# Patient Record
Sex: Female | Born: 1935 | Race: White | Hispanic: No | Smoking: Former smoker
Health system: Southern US, Community
[De-identification: ages and names within clinical notes are randomized; demographics above are authoritative.]

## PROBLEM LIST (undated history)

## (undated) DIAGNOSIS — I639 Cerebral infarction, unspecified: Secondary | ICD-10-CM

## (undated) DIAGNOSIS — C801 Malignant (primary) neoplasm, unspecified: Secondary | ICD-10-CM

## (undated) DIAGNOSIS — I499 Cardiac arrhythmia, unspecified: Secondary | ICD-10-CM

## (undated) DIAGNOSIS — M1990 Unspecified osteoarthritis, unspecified site: Secondary | ICD-10-CM

## (undated) DIAGNOSIS — Z8489 Family history of other specified conditions: Secondary | ICD-10-CM

## (undated) DIAGNOSIS — T8859XA Other complications of anesthesia, initial encounter: Secondary | ICD-10-CM

## (undated) DIAGNOSIS — R350 Frequency of micturition: Secondary | ICD-10-CM

## (undated) DIAGNOSIS — I10 Essential (primary) hypertension: Secondary | ICD-10-CM

## (undated) DIAGNOSIS — J069 Acute upper respiratory infection, unspecified: Secondary | ICD-10-CM

## (undated) DIAGNOSIS — I341 Nonrheumatic mitral (valve) prolapse: Secondary | ICD-10-CM

## (undated) DIAGNOSIS — T4145XA Adverse effect of unspecified anesthetic, initial encounter: Secondary | ICD-10-CM

## (undated) DIAGNOSIS — R17 Unspecified jaundice: Secondary | ICD-10-CM

## (undated) DIAGNOSIS — J189 Pneumonia, unspecified organism: Secondary | ICD-10-CM

## (undated) HISTORY — PX: EYE SURGERY: SHX253

## (undated) HISTORY — PX: BREAST SURGERY: SHX581

## (undated) HISTORY — PX: FRACTURE SURGERY: SHX138

## (undated) HISTORY — PX: OTHER SURGICAL HISTORY: SHX169

## (undated) HISTORY — PX: APPENDECTOMY: SHX54

## (undated) HISTORY — PX: ELBOW FRACTURE SURGERY: SHX616

## (undated) HISTORY — PX: CARDIAC CATHETERIZATION: SHX172

## (undated) HISTORY — PX: HARDWARE REMOVAL: SHX979

---
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
## 1971-05-20 HISTORY — PX: ABDOMINAL HYSTERECTOMY: SHX81

## 2003-08-12 ENCOUNTER — Ambulatory Visit (HOSPITAL_COMMUNITY): Admission: RE | Admit: 2003-08-12 | Discharge: 2003-08-12 | Payer: Self-pay | Admitting: Internal Medicine

## 2007-05-20 DIAGNOSIS — J189 Pneumonia, unspecified organism: Secondary | ICD-10-CM

## 2007-05-20 HISTORY — DX: Pneumonia, unspecified organism: J18.9

## 2009-05-19 ENCOUNTER — Ambulatory Visit: Payer: Self-pay | Admitting: Internal Medicine

## 2009-06-12 ENCOUNTER — Ambulatory Visit: Payer: Self-pay | Admitting: Internal Medicine

## 2009-06-19 ENCOUNTER — Ambulatory Visit: Payer: Self-pay | Admitting: Internal Medicine

## 2010-05-19 HISTORY — PX: BACK SURGERY: SHX140

## 2011-03-27 ENCOUNTER — Other Ambulatory Visit: Payer: Self-pay | Admitting: Neurosurgery

## 2011-04-04 ENCOUNTER — Encounter (HOSPITAL_COMMUNITY): Payer: Self-pay

## 2011-04-07 ENCOUNTER — Encounter (HOSPITAL_COMMUNITY): Payer: Self-pay | Admitting: Pharmacy Technician

## 2011-04-08 MED ORDER — CEFAZOLIN SODIUM 1-5 GM-% IV SOLN
1.0000 g | INTRAVENOUS | Status: AC
  Administered 2011-04-09: 1 g via INTRAVENOUS
  Filled 2011-04-08: qty 50

## 2011-04-09 ENCOUNTER — Encounter (HOSPITAL_COMMUNITY): Payer: Self-pay | Admitting: Certified Registered"

## 2011-04-09 ENCOUNTER — Ambulatory Visit (HOSPITAL_COMMUNITY): Payer: Medicare Other

## 2011-04-09 ENCOUNTER — Ambulatory Visit (HOSPITAL_COMMUNITY): Payer: Medicare Other | Admitting: Certified Registered"

## 2011-04-09 ENCOUNTER — Ambulatory Visit (HOSPITAL_COMMUNITY)
Admission: RE | Admit: 2011-04-09 | Discharge: 2011-04-10 | Disposition: A | Discharge status: Home / Self Care | Payer: Medicare Other | Source: Ambulatory Visit | Attending: Neurosurgery | Admitting: Neurosurgery

## 2011-04-09 ENCOUNTER — Encounter (HOSPITAL_COMMUNITY): Payer: Self-pay | Admitting: *Deleted

## 2011-04-09 ENCOUNTER — Other Ambulatory Visit: Payer: Self-pay | Admitting: Neurosurgery

## 2011-04-09 ENCOUNTER — Other Ambulatory Visit: Payer: Self-pay

## 2011-04-09 ENCOUNTER — Encounter (HOSPITAL_COMMUNITY)
Admission: RE | Disposition: A | Discharge status: Home / Self Care | Payer: Self-pay | Source: Ambulatory Visit | Attending: Neurosurgery

## 2011-04-09 DIAGNOSIS — S32009A Unspecified fracture of unspecified lumbar vertebra, initial encounter for closed fracture: Secondary | ICD-10-CM | POA: Insufficient documentation

## 2011-04-09 DIAGNOSIS — I059 Rheumatic mitral valve disease, unspecified: Secondary | ICD-10-CM | POA: Insufficient documentation

## 2011-04-09 DIAGNOSIS — S32000A Wedge compression fracture of unspecified lumbar vertebra, initial encounter for closed fracture: Secondary | ICD-10-CM

## 2011-04-09 DIAGNOSIS — Y929 Unspecified place or not applicable: Secondary | ICD-10-CM | POA: Insufficient documentation

## 2011-04-09 DIAGNOSIS — W19XXXA Unspecified fall, initial encounter: Secondary | ICD-10-CM | POA: Insufficient documentation

## 2011-04-09 DIAGNOSIS — I10 Essential (primary) hypertension: Secondary | ICD-10-CM | POA: Insufficient documentation

## 2011-04-09 HISTORY — DX: Wedge compression fracture of unspecified lumbar vertebra, initial encounter for closed fracture: S32.000A

## 2011-04-09 HISTORY — DX: Unspecified porphyria: E80.20

## 2011-04-09 HISTORY — DX: Acute upper respiratory infection, unspecified: J06.9

## 2011-04-09 HISTORY — DX: Unspecified osteoarthritis, unspecified site: M19.90

## 2011-04-09 HISTORY — DX: Nonrheumatic mitral (valve) prolapse: I34.1

## 2011-04-09 HISTORY — DX: Cardiac arrhythmia, unspecified: I49.9

## 2011-04-09 HISTORY — DX: Essential (primary) hypertension: I10

## 2011-04-09 HISTORY — DX: Pneumonia, unspecified organism: J18.9

## 2011-04-09 HISTORY — DX: Other complications of anesthesia, initial encounter: T88.59XA

## 2011-04-09 HISTORY — DX: Adverse effect of unspecified anesthetic, initial encounter: T41.45XA

## 2011-04-09 LAB — BASIC METABOLIC PANEL
Chloride: 103 mEq/L (ref 96–112)
Creatinine, Ser: 0.74 mg/dL (ref 0.50–1.10)
Potassium: 3.8 mEq/L (ref 3.5–5.1)

## 2011-04-09 LAB — CBC
HCT: 37.2 % (ref 36.0–46.0)
Hemoglobin: 12.7 g/dL (ref 12.0–15.0)
MCH: 30.7 pg (ref 26.0–34.0)
MCHC: 34.1 g/dL (ref 30.0–36.0)

## 2011-04-09 LAB — SURGICAL PCR SCREEN
MRSA, PCR: NEGATIVE
Staphylococcus aureus: NEGATIVE

## 2011-04-09 SURGERY — KYPHOPLASTY
Anesthesia: General | Wound class: Clean

## 2011-04-09 MED ORDER — DOCUSATE SODIUM 100 MG PO CAPS
100.0000 mg | ORAL_CAPSULE | Freq: Two times a day (BID) | ORAL | Status: DC
  Administered 2011-04-09: 100 mg via ORAL
  Filled 2011-04-09: qty 1

## 2011-04-09 MED ORDER — MECLIZINE HCL 25 MG PO TABS
25.0000 mg | ORAL_TABLET | Freq: Three times a day (TID) | ORAL | Status: DC | PRN
  Filled 2011-04-09: qty 1

## 2011-04-09 MED ORDER — LACTATED RINGERS IV SOLN
INTRAVENOUS | Status: DC | PRN
  Administered 2011-04-09: 14:00:00 via INTRAVENOUS

## 2011-04-09 MED ORDER — PHENOL 1.4 % MT LIQD: 1.0000 | OROMUCOSAL | Status: DC | PRN

## 2011-04-09 MED ORDER — MUPIROCIN 2 % EX OINT
TOPICAL_OINTMENT | Freq: Two times a day (BID) | CUTANEOUS | Status: DC
  Administered 2011-04-09: 10:00:00 via NASAL
  Filled 2011-04-09 (×2): qty 22

## 2011-04-09 MED ORDER — SODIUM CHLORIDE 0.9 % IV SOLN: 250.0000 mL | INTRAVENOUS | Status: DC

## 2011-04-09 MED ORDER — FENTANYL CITRATE 0.05 MG/ML IJ SOLN
INTRAMUSCULAR | Status: DC | PRN
  Administered 2011-04-09 (×2): 50 ug via INTRAVENOUS

## 2011-04-09 MED ORDER — ONDANSETRON HCL 4 MG/2ML IJ SOLN
4.0000 mg | INTRAMUSCULAR | Status: DC | PRN
  Administered 2011-04-09: 4 mg via INTRAVENOUS
  Filled 2011-04-09: qty 2

## 2011-04-09 MED ORDER — MENTHOL 3 MG MT LOZG
1.0000 | LOZENGE | OROMUCOSAL | Status: DC | PRN
  Filled 2011-04-09: qty 9

## 2011-04-09 MED ORDER — LACTATED RINGERS IV SOLN
INTRAVENOUS | Status: DC
  Administered 2011-04-09: 21:00:00 via INTRAVENOUS

## 2011-04-09 MED ORDER — BACITRACIN ZINC 500 UNIT/GM EX OINT
TOPICAL_OINTMENT | CUTANEOUS | Status: DC | PRN
  Administered 2011-04-09: 1 via TOPICAL

## 2011-04-09 MED ORDER — MORPHINE SULFATE 4 MG/ML IJ SOLN: 1.0000 mg | INTRAMUSCULAR | Status: DC | PRN

## 2011-04-09 MED ORDER — IBUPROFEN 800 MG PO TABS
800.0000 mg | ORAL_TABLET | Freq: Two times a day (BID) | ORAL | Status: DC | PRN
  Filled 2011-04-09: qty 1

## 2011-04-09 MED ORDER — SODIUM CHLORIDE 0.9 % IJ SOLN: 3.0000 mL | INTRAMUSCULAR | Status: DC | PRN

## 2011-04-09 MED ORDER — ACETAMINOPHEN 325 MG PO TABS: 650.0000 mg | ORAL_TABLET | ORAL | Status: DC | PRN

## 2011-04-09 MED ORDER — SODIUM CHLORIDE 0.9 % IJ SOLN
3.0000 mL | Freq: Two times a day (BID) | INTRAMUSCULAR | Status: DC
  Administered 2011-04-09: 3 mL via INTRAVENOUS

## 2011-04-09 MED ORDER — SIMVASTATIN 5 MG PO TABS
5.0000 mg | ORAL_TABLET | Freq: Every day | ORAL | Status: DC
  Administered 2011-04-09: 5 mg via ORAL
  Filled 2011-04-09 (×2): qty 1

## 2011-04-09 MED ORDER — ROCURONIUM BROMIDE 100 MG/10ML IV SOLN
INTRAVENOUS | Status: DC | PRN
  Administered 2011-04-09: 40 mg via INTRAVENOUS

## 2011-04-09 MED ORDER — BUPIVACAINE-EPINEPHRINE 0.5% -1:200000 IJ SOLN
INTRAMUSCULAR | Status: DC | PRN
  Administered 2011-04-09: 16 mL

## 2011-04-09 MED ORDER — ONDANSETRON HCL 4 MG/2ML IJ SOLN
INTRAMUSCULAR | Status: DC | PRN
  Administered 2011-04-09: 4 mg via INTRAVENOUS

## 2011-04-09 MED ORDER — FENTANYL CITRATE 0.05 MG/ML IJ SOLN
25.0000 ug | INTRAMUSCULAR | Status: DC | PRN
  Administered 2011-04-09 (×3): 50 ug via INTRAVENOUS

## 2011-04-09 MED ORDER — ACETAMINOPHEN 650 MG RE SUPP: 650.0000 mg | RECTAL | Status: DC | PRN

## 2011-04-09 MED ORDER — POLYETHYL GLYCOL-PROPYL GLYCOL 0.4-0.3 % OP SOLN: 2.0000 [drp] | Freq: Three times a day (TID) | OPHTHALMIC | Status: DC | PRN

## 2011-04-09 MED ORDER — IOHEXOL 300 MG/ML  SOLN
INTRAMUSCULAR | Status: DC | PRN
  Administered 2011-04-09: 50 mL via INTRAVENOUS

## 2011-04-09 MED ORDER — LOSARTAN POTASSIUM 25 MG PO TABS
25.0000 mg | ORAL_TABLET | Freq: Two times a day (BID) | ORAL | Status: DC
  Administered 2011-04-09: 25 mg via ORAL
  Filled 2011-04-09 (×3): qty 1

## 2011-04-09 MED ORDER — HYDROCODONE-ACETAMINOPHEN 5-325 MG PO TABS
1.0000 | ORAL_TABLET | ORAL | Status: DC | PRN
  Administered 2011-04-09 – 2011-04-10 (×2): 2 via ORAL
  Filled 2011-04-09 (×2): qty 2

## 2011-04-09 MED ORDER — CEFAZOLIN SODIUM 1-5 GM-% IV SOLN
1.0000 g | Freq: Three times a day (TID) | INTRAVENOUS | Status: AC
  Administered 2011-04-09 – 2011-04-10 (×2): 1 g via INTRAVENOUS
  Filled 2011-04-09 (×2): qty 50

## 2011-04-09 MED ORDER — NADOLOL 20 MG PO TABS: 5.0000 mg | ORAL_TABLET | Freq: Two times a day (BID) | ORAL | Status: DC

## 2011-04-09 MED ORDER — POLYVINYL ALCOHOL 1.4 % OP SOLN
1.0000 [drp] | Freq: Three times a day (TID) | OPHTHALMIC | Status: DC | PRN
  Administered 2011-04-09: 1 [drp] via OPHTHALMIC
  Filled 2011-04-09: qty 15

## 2011-04-09 MED ORDER — OXYCODONE-ACETAMINOPHEN 5-325 MG PO TABS
1.0000 | ORAL_TABLET | ORAL | Status: DC | PRN
Start: 2011-04-09 — End: 2011-04-10

## 2011-04-09 MED ORDER — DIAZEPAM 5 MG PO TABS: 5.0000 mg | ORAL_TABLET | Freq: Four times a day (QID) | ORAL | Status: DC | PRN

## 2011-04-09 MED ORDER — NEOSTIGMINE METHYLSULFATE 1 MG/ML IJ SOLN
INTRAMUSCULAR | Status: DC | PRN
  Administered 2011-04-09: 4 mg via INTRAVENOUS

## 2011-04-09 MED ORDER — PROPOFOL 10 MG/ML IV EMUL
INTRAVENOUS | Status: DC | PRN
  Administered 2011-04-09: 100 mg via INTRAVENOUS

## 2011-04-09 MED ORDER — PROMETHAZINE HCL 25 MG/ML IJ SOLN: 6.2500 mg | INTRAMUSCULAR | Status: DC | PRN

## 2011-04-09 MED ORDER — GLYCOPYRROLATE 0.2 MG/ML IJ SOLN
INTRAMUSCULAR | Status: DC | PRN
  Administered 2011-04-09: .6 mg via INTRAVENOUS

## 2011-04-09 MED ORDER — ZOLPIDEM TARTRATE 5 MG PO TABS: 5.0000 mg | ORAL_TABLET | Freq: Every evening | ORAL | Status: DC | PRN

## 2011-04-09 MED ORDER — THERA M PLUS PO TABS
1.0000 | ORAL_TABLET | Freq: Every day | ORAL | Status: DC
  Filled 2011-04-09 (×2): qty 1

## 2011-04-09 SURGICAL SUPPLY — 55 items
BENZOIN TINCTURE PRP APPL 2/3 (GAUZE/BANDAGES/DRESSINGS) ×2 IMPLANT
BLADE SURG 15 STRL LF DISP TIS (BLADE) ×1 IMPLANT
BLADE SURG 15 STRL SS (BLADE) ×1
BLADE SURG ROTATE 9660 (MISCELLANEOUS) IMPLANT
CEMENT BONE KYPHX HV R (Orthopedic Implant) ×2 IMPLANT
CEMENT KYPHON C01A KIT/MIXER (Cement) ×2 IMPLANT
CLOTH BEACON ORANGE TIMEOUT ST (SAFETY) ×2 IMPLANT
CONT SPEC 4OZ CLIKSEAL STRL BL (MISCELLANEOUS) ×4 IMPLANT
DERMABOND ADVANCED (GAUZE/BANDAGES/DRESSINGS) ×1
DERMABOND ADVANCED .7 DNX12 (GAUZE/BANDAGES/DRESSINGS) ×1 IMPLANT
DEVICE BIOPSY BONE KYPHX (INSTRUMENTS) ×2 IMPLANT
DRAPE C-ARM 42X72 X-RAY (DRAPES) ×2 IMPLANT
DRAPE INCISE IOBAN 66X45 STRL (DRAPES) ×2 IMPLANT
DRAPE LAPAROTOMY 100X72X124 (DRAPES) ×2 IMPLANT
DRAPE PROXIMA HALF (DRAPES) ×6 IMPLANT
DRAPE SURG 17X23 STRL (DRAPES) ×8 IMPLANT
GAUZE SPONGE 4X4 16PLY XRAY LF (GAUZE/BANDAGES/DRESSINGS) ×2 IMPLANT
GLOVE BIO SURGEON STRL SZ8.5 (GLOVE) IMPLANT
GLOVE BIOGEL PI IND STRL 6.5 (GLOVE) ×1 IMPLANT
GLOVE BIOGEL PI IND STRL 7.0 (GLOVE) ×1 IMPLANT
GLOVE BIOGEL PI IND STRL 8 (GLOVE) ×1 IMPLANT
GLOVE BIOGEL PI IND STRL 8.5 (GLOVE) ×1 IMPLANT
GLOVE BIOGEL PI INDICATOR 6.5 (GLOVE) ×1
GLOVE BIOGEL PI INDICATOR 7.0 (GLOVE) ×1
GLOVE BIOGEL PI INDICATOR 8 (GLOVE) ×1
GLOVE BIOGEL PI INDICATOR 8.5 (GLOVE) ×1
GLOVE EXAM NITRILE LRG STRL (GLOVE) IMPLANT
GLOVE EXAM NITRILE MD LF STRL (GLOVE) ×2 IMPLANT
GLOVE EXAM NITRILE XL STR (GLOVE) IMPLANT
GLOVE EXAM NITRILE XS STR PU (GLOVE) IMPLANT
GLOVE INDICATOR 7.0 STRL GRN (GLOVE) ×2 IMPLANT
GLOVE OPTIFIT SS 6.5 STRL BRWN (GLOVE) ×2 IMPLANT
GLOVE SS BIOGEL STRL SZ 8 (GLOVE) IMPLANT
GLOVE SUPERSENSE BIOGEL SZ 8 (GLOVE)
GLOVE SURG SS PI 6.5 STRL IVOR (GLOVE) ×2 IMPLANT
GLOVE SURG SS PI 7.5 STRL IVOR (GLOVE) ×2 IMPLANT
GLOVE SURG SS PI 8.0 STRL IVOR (GLOVE) ×2 IMPLANT
GLOVE SURG SS PI 8.5 STRL IVOR (GLOVE) ×1
GLOVE SURG SS PI 8.5 STRL STRW (GLOVE) ×1 IMPLANT
GOWN BRE IMP SLV AUR LG STRL (GOWN DISPOSABLE) ×2 IMPLANT
GOWN BRE IMP SLV AUR XL STRL (GOWN DISPOSABLE) ×4 IMPLANT
KIT BASIN OR (CUSTOM PROCEDURE TRAY) ×2 IMPLANT
KIT ROOM TURNOVER OR (KITS) ×2 IMPLANT
NEEDLE HYPO 25X1 1.5 SAFETY (NEEDLE) ×2 IMPLANT
NS IRRIG 1000ML POUR BTL (IV SOLUTION) ×2 IMPLANT
PACK EENT II TURBAN DRAPE (CUSTOM PROCEDURE TRAY) ×2 IMPLANT
PAD ARMBOARD 7.5X6 YLW CONV (MISCELLANEOUS) ×6 IMPLANT
SPECIMEN JAR SMALL (MISCELLANEOUS) IMPLANT
SPONGE GAUZE 4X4 12PLY (GAUZE/BANDAGES/DRESSINGS) ×4 IMPLANT
STRIP CLOSURE SKIN 1/2X4 (GAUZE/BANDAGES/DRESSINGS) ×2 IMPLANT
SUT VIC AB 3-0 SH 8-18 (SUTURE) ×2 IMPLANT
SYR CONTROL 10ML LL (SYRINGE) ×2 IMPLANT
TOWEL OR 17X24 6PK STRL BLUE (TOWEL DISPOSABLE) ×2 IMPLANT
TOWEL OR 17X26 10 PK STRL BLUE (TOWEL DISPOSABLE) ×2 IMPLANT
TRAY KYPHOPAK 20/3 ONESTEP 1ST (MISCELLANEOUS) ×2 IMPLANT

## 2011-04-09 NOTE — Anesthesia Preprocedure Evaluation (Addendum)
Anesthesia Evaluation  Patient identified by MRN, date of birth, ID band Patient awake    Reviewed: Allergy & Precautions, H&P , NPO status , Patient's Chart, lab work & pertinent test results, reviewed documented beta blocker date and time   History of Anesthesia Complications History of anesthetic complications: prolonged wake up   Airway Mallampati: II TM Distance: >3 FB Neck ROM: Full    Dental No notable dental hx. (+) Teeth Intact   Pulmonary neg pulmonary ROS,  clear to auscultation  Pulmonary exam normal       Cardiovascular hypertension (treated with cozaar), Pt. on medications Valvular problems/murmurs: history of MVP- never symptomatic, ?ECHO in past? 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 or been symptomatic, never diagnosed   Anesthesia Other Findings   Reproductive/Obstetrics                      Anesthesia Physical Anesthesia Plan  ASA: II  Anesthesia Plan: General   Post-op Pain Management:    Induction: Intravenous  Airway Management Planned: Oral ETT  Additional Equipment:   Intra-op Plan:   Post-operative Plan: Extubation in OR  Informed Consent: I have reviewed the patients History and Physical, chart, labs and discussed the procedure including the risks, benefits and alternatives for the proposed anesthesia with the patient or authorized representative who has indicated his/her understanding and acceptance.   Dental advisory given  Plan Discussed with:   Anesthesia Plan Comments: (Plan routine monitors, GETA with careful prone positioning)        Anesthesia Quick  Evaluation

## 2011-04-09 NOTE — Transfer of Care (Signed)
Immediate Anesthesia Transfer of Care Note  Patient: Chelsea Mcintyre  Procedure(s) Performed:  KYPHOPLASTY - Lumbar one Kyphoplasty      Patient Location: PACU  Anesthesia Type: General  Level of Consciousness: awake  Airway & Oxygen Therapy: Patient Spontanous Breathing and Patient connected to face mask oxygen  Post-op Assessment: Report given to PACU RN, Post -op Vital signs reviewed and stable and Patient moving all extremities X 4  Post vital signs: stable  Complications: No apparent anesthesia complications

## 2011-04-09 NOTE — Op Note (Signed)
Preoperative diagnosis: L1 compression fracture  Postop diagnosis: The same  Procedure: L1 kyphoplasty and vertebral body biopsy.  Surgeon Dr. Delma Officer  Asst.: None  Anesthesia: Gen. endotracheal  Estimated blood loss is minimal  Specimens: L1 vertebral body biopsy  Drains: None  Complications: None  Description of procedure: The patient was brought to the operating room by the anesthesia team. General endotracheal anesthesia was induced. The patient was turned to the prone position on the chest rolls. Patient's thoracolumbar region was then prepared with Betadine scrub and Betadine solution. The area to be incised was injected with Marcaine with epinephrine solution.  I localized the L1 vertebrae using biplanar fluoroscopy. I then used a 15 blade scalpel to make a small incision over the L1 pedicle bilaterally. Then cannulated the pedicles with the trocar under biplanar fluoroscopy. I then removed the cannula obtained the vertebral by biopsy via the L1 pedicle on the left. I then used a drill to drill the pedicle at L1 bilaterally under fluoroscopic guidance. I then inserted and inflated the balloons bilaterally under fluoroscopic guidance. I then deflated the balloon and removed the balloon. I then filled the void created by the balloon with methylmethacrylate under fluoroscopic guidance. After were satisfied with the kyphoplasty I removed the cannulas.  I then reapproximated patient's subcutaneous tissue with interrupted 3-0 Vicryl suture. I then coated the skin with Dermabond. The drapes were removed. The patient was subtotally returned to the supine position where she was extubated by the anesthesia team. Patient was transported to the post anesthesia care unit in stable condition. All sponge instrument and needle counts were correct at the end of this case.

## 2011-04-09 NOTE — Plan of Care (Signed)
Problem: Consults Goal: Diagnosis - Spinal Surgery Outcome: Completed/Met Date Met:  04/09/11 Kyphoplasty

## 2011-04-09 NOTE — Progress Notes (Signed)
Dr. Jean Rosenthal notified of anesthesia history

## 2011-04-09 NOTE — Anesthesia Postprocedure Evaluation (Signed)
  2Anesthesia Post-op Note  Patient: Chelsea Mcintyre  Procedure(s) Performed:  KYPHOPLASTY - Lumbar one Kyphoplasty      Patient Location: PACU  Anesthesia Type: General  Level of Consciousness: awake and alert   Airway and Oxygen Therapy: Patient Spontanous Breathing and Patient connected to nasal cannula oxygen  Post-op Pain: none  Post-op Assessment: Post-op Vital signs reviewed, Patient's Cardiovascular Status Stable, Respiratory Function Stable, Patent Airway, No signs of Nausea or vomiting and Pain level controlled  Post-op Vital Signs: Reviewed and stable  Complications: No apparent anesthesia complications

## 2011-04-09 NOTE — H&P (Signed)
Subjective: Patient is a 77 year old white female who was in her usual state of good health until 03/15/2011. On that day the patient fell and had the immediate onset of back pain. She failed medical management was worked up with a lumbar MRI which demonstrated L1 compression fracture. I discussed the various treatment options with her including surgery. Patient has decided proceed with surgery.   Past Medical History  Diagnosis Date  . Complication of anesthesia     heart stopped with the anesthesia x1,  difficult to wake up  . MVP (mitral valve prolapse)   . Hypertension   . Dysrhythmia     if taking certain medications  . Pneumonia   . Recurrent upper respiratory infection (URI)     Dx 1st of september with sinus infection, placed on antibiotics at that time  . Arthritis     bilateral hips and bilateral shoulders.  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 Tonsillectomy   . Appendectomy   . Fibroid tumor     1970, right breast removed  . Fibroid cyst     ovary right removed 1966  . Abdominal hysterectomy     partial  . Breast surgery     1982  . Cardiac catheterization     1994, detected MVP  . Fracture surgery     1988 shattered elbow left  . Hardware removal     left elbow 1998  . Eye surgery     bilateral cataract surgery 2007 with lens implant    Allergies  Allergen Reactions  . Sulfa Drugs Cross Reactors Shortness Of Breath and Rash  . Latex Hives  . Tape Other (See Comments)    Layer of skin peels off and area becomes raw    History  Substance Use Topics  . Smoking status: Former Smoker -- 0.5 packs/day for 2 years  . Smokeless tobacco: Not on file   Comment: off and on, non smoker presently  . Alcohol Use: No    Family History  Problem Relation Age of Onset  . Anesthesia problems Father    Prior to Admission medications   Medication Sig Start Date End Date Taking?  Authorizing Provider  aspirin EC 81 MG tablet Take 81 mg by mouth every evening.     Yes Historical Provider, MD  calcium carbonate (OS-CAL - DOSED IN MG OF ELEMENTAL CALCIUM) 1250 MG tablet Take 1 tablet by mouth daily.     Yes Historical Provider, MD  cholecalciferol (VITAMIN D) 1000 UNITS tablet Take 2,000 Units by mouth daily.     Yes Historical Provider, MD  HYDROcodone-acetaminophen (NORCO) 5-325 MG per tablet Take 2 tablets by mouth every 6 (six) hours as needed. For pain    Yes Historical Provider, MD  ibuprofen (ADVIL,MOTRIN) 800 MG tablet Take 800 mg by mouth 2 (two) times daily as needed. For pain    Yes Historical Provider, MD  losartan (COZAAR) 50 MG tablet Take 25 mg by mouth 2 (two) times daily.     Yes Historical Provider, MD  meclizine (ANTIVERT) 25 MG tablet Take 25 mg by mouth daily as needed. For dizzines    Yes Historical Provider, MD  Multiple Vitamins-Minerals (MULTIVITAMINS THER. W/MINERALS) TABS Take 1 tablet by mouth daily.     Yes Historical Provider, MD  nadolol (CORGARD) 20 MG tablet Take 5 mg by mouth 2 (two) times  daily.     Yes Historical Provider, MD  Polyethyl Glycol-Propyl Glycol (SYSTANE) 0.4-0.3 % SOLN Apply 2 drops to eye 3 (three) times daily as needed. For dry eyes    Yes Historical Provider, MD  simvastatin (ZOCOR) 10 MG tablet Take 5 mg by mouth at bedtime.     Yes Historical Provider, MD     Review of Systems  Positive ROS: Negative except as above  All other systems have been reviewed and were otherwise negative with the exception of those mentioned in the HPI and as above.  Objective: Vital signs in last 24 hours: Temp:  [98.5 F (36.9 C)] 98.5 F (36.9 C) (11/21 1019) Pulse Rate:  [64] 64  (11/21 1019) Resp:  [18] 18  (11/21 1019) BP: (171)/(78) 171/78 mmHg (11/21 1019) SpO2:  [99 %] 99 % (11/21 1019) Weight:  [79.379 kg (175 lb)-81.647 kg (180 lb)] 175 lb (79.379 kg) (11/21 1003)  General Appearance: Alert, cooperative, no distress,  appears stated age Head: Normocephalic, without obvious abnormality, atraumatic Eyes: PERRL, conjunctiva/corneas clear, EOM's intact, fundi benign, both eyes      Ears: Normal TM's and external ear canals, both ears Throat: Lips, mucosa, and tongue normal; teeth and gums normal Neck: Supple, symmetrical, trachea midline, no adenopathy; thyroid: No enlargement/tenderness/nodules; no carotid bruit or JVD Back: The patient complains of back pain at approximately L1. There is no obvious deformities. Lungs: Clear to auscultation bilaterally, respirations unlabored Heart: Regular rate and rhythm, S1 and S2 normal, no murmur, rub or gallop Abdomen: Soft, non-tender, bowel sounds active all four quadrants, no masses, no organomegaly Extremities: Extremities normal, atraumatic, no cyanosis or edema Pulses: 2+ and symmetric all extremities Skin: Skin color, texture, turgor normal, no rashes or lesions  NEUROLOGIC:   Mental status: alert and oriented, no aphasia, good attention span, Fund of knowledge/ memory ok Motor Exam - grossly normal Sensory Exam - grossly normal Reflexes:  Coordination - grossly normal Gait - grossly normal Balance - grossly normal Cranial Nerves: I: smell Not tested  II: visual acuity  OS: Normal    OD: Normal   II: visual fields Full to confrontation  II: pupils Equal, round, reactive to light  III,VII: ptosis None  III,IV,VI: extraocular muscles  Full ROM  V: mastication Normal  V: facial light touch sensation  Normal  V,VII: corneal reflex  Present  VII: facial muscle function - upper  Normal  VII: facial muscle function - lower Normal  VIII: hearing Not tested  IX: soft palate elevation  Normal  IX,X: gag reflex Present  XI: trapezius strength  5/5  XI: sternocleidomastoid strength 5/5  XI: neck flexion strength  5/5  XII: tongue strength  Normal    Data Review Lab Results  Component Value Date   WBC 5.7 04/09/2011   HGB 12.7 04/09/2011   HCT 37.2  04/09/2011   MCV 89.9 04/09/2011   PLT 196 04/09/2011   Lab Results  Component Value Date   NA 141 04/09/2011   K 3.8 04/09/2011   CL 103 04/09/2011   CO2 29 04/09/2011   BUN 15 04/09/2011   CREATININE 0.74 04/09/2011   GLUCOSE 99 04/09/2011   No results found for this basename: INR, PROTIME   imaging studies: I reviewed the patient's lumbar MRI performed at [facility]. Demonstrates the patient has disc degeneration at L2-3 and L3-4. She has an L1 compression fracture without significant neural compression.  Assessment/Plan: L1 compression fracture, lumbago: I discussed situation with the patient and  her family. We have discussed the various treatment options including surgery. I described the surgical option of an L1 kyphoplasty. We have discussed the risks, benefits, alternatives, and likelihood of achieving our goals. I have answered all the patient's and family's questions. They want to proceed with surgery.   Daionna Crossland D 04/09/2011 1:42 PM

## 2011-04-09 NOTE — Progress Notes (Signed)
Subjective:  The patient is alert and pleasant. She appears comfortable.  Objective: Vital signs in last 24 hours: Temp:  [97.2 F (36.2 C)-98.5 F (36.9 C)] 97.2 F (36.2 C) (11/21 1530) Pulse Rate:  [64] 64  (11/21 1019) Resp:  [18] 18  (11/21 1019) BP: (171)/(78) 171/78 mmHg (11/21 1019) SpO2:  [99 %] 99 % (11/21 1019) Weight:  [79.379 kg (175 lb)-81.647 kg (180 lb)] 175 lb (79.379 kg) (11/21 1003)  Intake/Output from previous day:   Intake/Output this shift: Total I/O In: 1000 [I.V.:1000] Out: 10 [Blood:10]  Physical exam the patient is alert. She is moving all 4 extremities well.  Lab Results:  Basename 04/09/11 1004  WBC 5.7  HGB 12.7  HCT 37.2  PLT 196   BMET  Basename 04/09/11 1004  NA 141  K 3.8  CL 103  CO2 29  GLUCOSE 99  BUN 15  CREATININE 0.74  CALCIUM 9.2    Studies/Results: Dg Chest 2 View  04/09/2011  *RADIOLOGY REPORT*  Clinical Data: History of hypertension and preop evaluation. L1 compression fracture.  CHEST - 2 VIEW  Comparison: None.  Findings: Two views of the chest demonstrate a compression fracture in the upper lumbar spine representing the L1 fracture.  There is also a compression deformity involving the T8 vertebral body. Lungs are clear bilaterally.  Heart and mediastinum are within normal limits and the trachea is midline.  IMPRESSION: No acute chest findings.  Compression fractures involving L1 and T8.  Original Report Authenticated By: Richarda Overlie, M.D.    Assessment/Plan: The patient is doing well.  LOS: 0 days     Kathi Dohn D 04/09/2011, 3:48 PM

## 2011-04-09 NOTE — Preoperative (Signed)
Beta Blockers   Reason not to administer Beta Blockers:Patient took beta blocker 11/21 am

## 2011-04-10 MED ORDER — HYDROCODONE-ACETAMINOPHEN 5-325 MG PO TABS: 1.0000 | ORAL_TABLET | ORAL | Status: AC | PRN

## 2011-04-10 NOTE — Discharge Summary (Signed)
Physician Discharge Summary  Patient ID: Chelsea Mcintyre MRN: [id number] DOB/AGE: 1933/05/12 77 y.o.  Admit date: 04/09/2011 Discharge date: 04/10/2011  Admission Diagnoses: Compression fracture  Discharge Diagnoses: Same Principal Problem:  *Lumbar compression fracture   Discharged Condition: good  Hospital Course: Patient was noted to the hospital underwent kyphoplasty postoperatively did bear well went to the recover and then the floor. She convalesced well by postop day 1 was angling and voiding spontaneously and ready for discharge.  Consults: none  Significant Diagnostic Studies:   Treatments: surgery: Kyphoplasty  Discharge Exam: Blood pressure 126/58, pulse 73, temperature 99 F (37.2 C), temperature source Oral, resp. rate 18, height 5\' 7"  (1.702 m), weight 79.379 kg (175 lb), SpO2 95.00%. Awake alert and oriented neurologically intact.  Disposition: Final discharge disposition not confirmed   Current Discharge Medication List    CONTINUE these medications which have NOT CHANGED   Details  aspirin EC 81 MG tablet Take 81 mg by mouth every evening.      calcium carbonate (OS-CAL - DOSED IN MG OF ELEMENTAL CALCIUM) 1250 MG tablet Take 1 tablet by mouth daily.      cholecalciferol (VITAMIN D) 1000 UNITS tablet Take 2,000 Units by mouth daily.      HYDROcodone-acetaminophen (NORCO) 5-325 MG per tablet Take 2 tablets by mouth every 6 (six) hours as needed. For pain     ibuprofen (ADVIL,MOTRIN) 800 MG tablet Take 800 mg by mouth 2 (two) times daily as needed. For pain     losartan (COZAAR) 50 MG tablet Take 25 mg by mouth 2 (two) times daily.      meclizine (ANTIVERT) 25 MG tablet Take 25 mg by mouth daily as needed. For dizzines     Multiple Vitamins-Minerals (MULTIVITAMINS THER. W/MINERALS) TABS Take 1 tablet by mouth daily.      nadolol (CORGARD) 20 MG tablet Take 5 mg by mouth 2 (two) times daily.     Polyethyl Glycol-Propyl Glycol (SYSTANE) 0.4-0.3 % SOLN  Apply 2 drops to eye 3 (three) times daily as needed. For dry eyes     simvastatin (ZOCOR) 10 MG tablet Take 5 mg by mouth at bedtime.           Signed: Trevor Duty P 04/10/2011, 7:37 AM

## 2011-04-10 NOTE — Progress Notes (Signed)
Subjective: Patient reports Patient for significant room and upper back pain. She's tolerating by mouth and is ready for discharge.  Objective: Vital signs in last 24 hours: Temp:  [97.2 F (36.2 C)-99.4 F (37.4 C)] 99 F (37.2 C) (11/22 0400) Pulse Rate:  [48-81] 73  (11/22 0400) Resp:  [11-33] 18  (11/22 0400) BP: (126-173)/(58-84) 126/58 mmHg (11/22 0400) SpO2:  [94 %-100 %] 95 % (11/22 0400) Weight:  [79.379 kg (175 lb)-81.647 kg (180 lb)] 175 lb (79.379 kg) (11/21 1003)  Intake/Output from previous day: 11/21 0701 - 11/22 0700 In: 1500 [I.V.:1500] Out: 10 [Blood:10] Intake/Output this shift:    neurologically stable with 5 out of 5 strength.  Lab Results:  Basename 04/09/11 1004  WBC 5.7  HGB 12.7  HCT 37.2  PLT 196   BMET  Basename 04/09/11 1004  NA 141  K 3.8  CL 103  CO2 29  GLUCOSE 99  BUN 15  CREATININE 0.74  CALCIUM 9.2    Studies/Results: Dg Chest 2 View  04/09/2011  *RADIOLOGY REPORT*  Clinical Data: History of hypertension and preop evaluation. L1 compression fracture.  CHEST - 2 VIEW  Comparison: None.  Findings: Two views of the chest demonstrate a compression fracture in the upper lumbar spine representing the L1 fracture.  There is also a compression deformity involving the T8 vertebral body. Lungs are clear bilaterally.  Heart and mediastinum are within normal limits and the trachea is midline.  IMPRESSION: No acute chest findings.  Compression fractures involving L1 and T8.  Original Report Authenticated By: Richarda Overlie, M.D.   Dg Thoracolumabar Spine  04/09/2011  *RADIOLOGY REPORT*  Clinical Data: 77 year old female undergoing L1 kyphoplasty.  THORACOLUMBAR SPINE - 2 VIEW  Comparison: Preoperative chest radiographs 04/09/2011.  Fluoroscopy time of 1.8 minutes was utilized.  Findings: 2 intraoperative fluoroscopic views of the thoracolumbar junction in the frontal and lateral projection.  Bone cement now in place at the L1 level which was  compressed on the comparison. Cement may extend into the T12-L1 disc space, more so on the right.  IMPRESSION: L1 kyphoplasty as above.  Original Report Authenticated By: Harley Hallmark, M.D.   Dg C-arm 61-120 Min  04/09/2011  CLINICAL DATA: L1 Kyphoplasty   C-ARM 61-120 MINUTES  Fluoroscopy was utilized by the requesting physician.  No radiographic  interpretation.      Assessment/Plan: Discharged home.  LOS: 1 day     Karin Griffith P 04/10/2011, 7:34 AM

## 2012-10-28 IMAGING — CR DG CHEST 2V
2 series · 2 of 2 positions shown · non-contrast
Comparison: None.

CLINICAL DATA: History of hypertension and preop evaluation. L1
compression fracture.

CHEST - 2 VIEW

[view not recorded (1 of 2)]
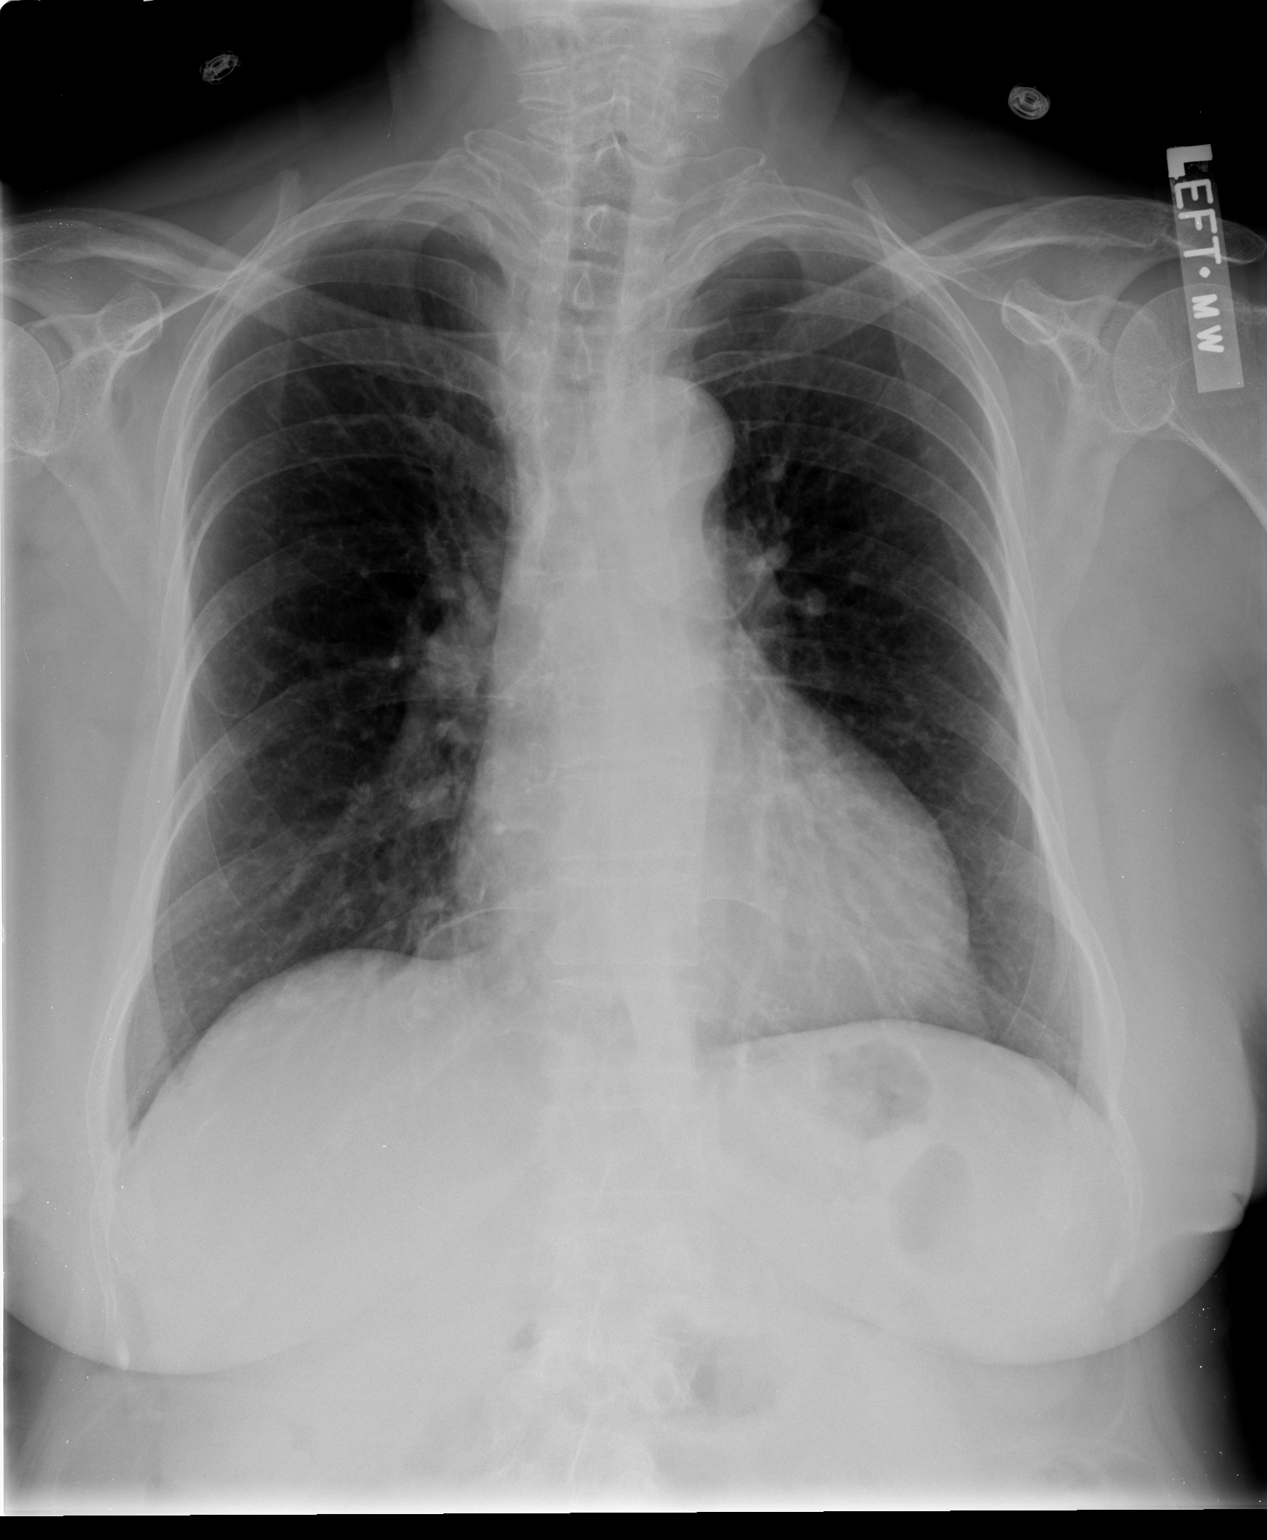

[view not recorded (2 of 2)]
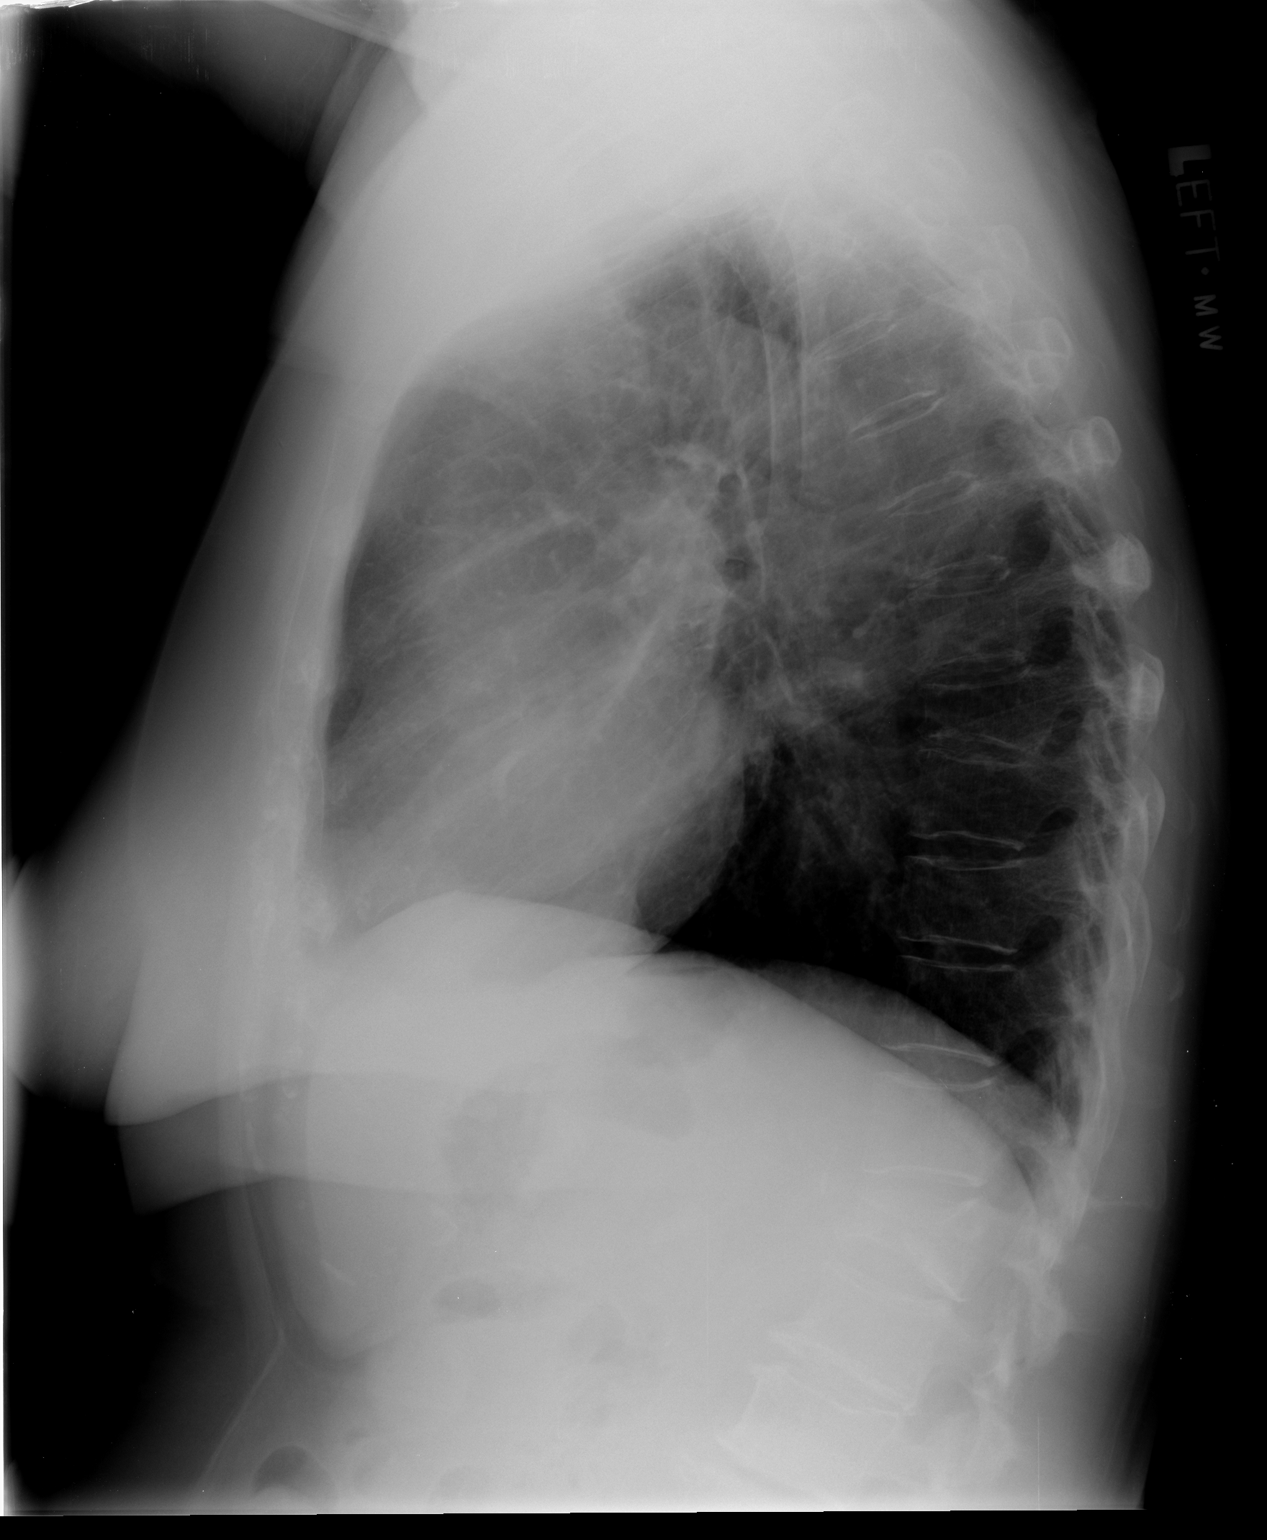

[2 of 2 positions shown; findings below may reference images not displayed]

FINDINGS: Two views of the chest demonstrate a compression fracture
in the upper lumbar spine representing the L1 fracture.  There is
also a compression deformity involving the T8 vertebral body.
Lungs are clear bilaterally.  Heart and mediastinum are within
normal limits and the trachea is midline.
IMPRESSION: No acute chest findings.

Compression fractures involving L1 and T8.

## 2015-04-25 ENCOUNTER — Ambulatory Visit: Payer: Self-pay | Admitting: Orthopedic Surgery

## 2015-05-04 ENCOUNTER — Encounter (HOSPITAL_COMMUNITY)
Admission: RE | Admit: 2015-05-04 | Discharge: 2015-05-04 | Disposition: A | Discharge status: Home / Self Care | Payer: Medicare Other | Source: Ambulatory Visit | Attending: Orthopedic Surgery | Admitting: Orthopedic Surgery

## 2015-05-04 ENCOUNTER — Encounter (HOSPITAL_COMMUNITY): Payer: Self-pay

## 2015-05-04 DIAGNOSIS — Z0183 Encounter for blood typing: Secondary | ICD-10-CM | POA: Insufficient documentation

## 2015-05-04 DIAGNOSIS — Z79899 Other long term (current) drug therapy: Secondary | ICD-10-CM | POA: Insufficient documentation

## 2015-05-04 DIAGNOSIS — Z01812 Encounter for preprocedural laboratory examination: Secondary | ICD-10-CM | POA: Diagnosis not present

## 2015-05-04 DIAGNOSIS — I10 Essential (primary) hypertension: Secondary | ICD-10-CM | POA: Diagnosis not present

## 2015-05-04 DIAGNOSIS — R001 Bradycardia, unspecified: Secondary | ICD-10-CM | POA: Insufficient documentation

## 2015-05-04 DIAGNOSIS — Z7982 Long term (current) use of aspirin: Secondary | ICD-10-CM | POA: Diagnosis not present

## 2015-05-04 DIAGNOSIS — I341 Nonrheumatic mitral (valve) prolapse: Secondary | ICD-10-CM | POA: Insufficient documentation

## 2015-05-04 DIAGNOSIS — M1612 Unilateral primary osteoarthritis, left hip: Secondary | ICD-10-CM | POA: Insufficient documentation

## 2015-05-04 DIAGNOSIS — Z01818 Encounter for other preprocedural examination: Secondary | ICD-10-CM | POA: Insufficient documentation

## 2015-05-04 DIAGNOSIS — Z87891 Personal history of nicotine dependence: Secondary | ICD-10-CM | POA: Insufficient documentation

## 2015-05-04 HISTORY — DX: Unspecified jaundice: R17

## 2015-05-04 HISTORY — DX: Family history of other specified conditions: Z84.89

## 2015-05-04 HISTORY — DX: Malignant (primary) neoplasm, unspecified: C80.1

## 2015-05-04 HISTORY — DX: Frequency of micturition: R35.0

## 2015-05-04 LAB — COMPREHENSIVE METABOLIC PANEL
ALK PHOS: 73 U/L (ref 38–126)
ALT: 19 U/L (ref 14–54)
AST: 26 U/L (ref 15–41)
Albumin: 3.9 g/dL (ref 3.5–5.0)
Anion gap: 8 (ref 5–15)
BUN: 21 mg/dL — AB (ref 6–20)
CALCIUM: 9.3 mg/dL (ref 8.9–10.3)
CO2: 29 mmol/L (ref 22–32)
CREATININE: 0.74 mg/dL (ref 0.44–1.00)
Chloride: 105 mmol/L (ref 101–111)
Glucose, Bld: 104 mg/dL — ABNORMAL HIGH (ref 65–99)
Potassium: 4.1 mmol/L (ref 3.5–5.1)
Sodium: 142 mmol/L (ref 135–145)
Total Bilirubin: 1 mg/dL (ref 0.3–1.2)
Total Protein: 6.7 g/dL (ref 6.5–8.1)

## 2015-05-04 LAB — URINALYSIS, ROUTINE W REFLEX MICROSCOPIC
Bilirubin Urine: NEGATIVE
GLUCOSE, UA: NEGATIVE mg/dL
Hgb urine dipstick: NEGATIVE
Ketones, ur: NEGATIVE mg/dL
LEUKOCYTES UA: NEGATIVE
NITRITE: NEGATIVE
PH: 6 (ref 5.0–8.0)
PROTEIN: NEGATIVE mg/dL
Specific Gravity, Urine: 1.022 (ref 1.005–1.030)

## 2015-05-04 LAB — ABO/RH: ABO/RH(D): A NEG

## 2015-05-04 LAB — CBC
HEMATOCRIT: 43.7 % (ref 36.0–46.0)
HEMOGLOBIN: 14.6 g/dL (ref 12.0–15.0)
MCH: 31.3 pg (ref 26.0–34.0)
MCHC: 33.4 g/dL (ref 30.0–36.0)
MCV: 93.6 fL (ref 78.0–100.0)
Platelets: 197 10*3/uL (ref 150–400)
RBC: 4.67 MIL/uL (ref 3.87–5.11)
RDW: 12.6 % (ref 11.5–15.5)
WBC: 4.4 10*3/uL (ref 4.0–10.5)

## 2015-05-04 LAB — PROTIME-INR
INR: 1.12 (ref 0.00–1.49)
PROTHROMBIN TIME: 14.6 s (ref 11.6–15.2)

## 2015-05-04 LAB — SURGICAL PCR SCREEN
MRSA, PCR: NEGATIVE
Staphylococcus aureus: NEGATIVE

## 2015-05-04 LAB — TYPE AND SCREEN
ABO/RH(D): A NEG
Antibody Screen: NEGATIVE

## 2015-05-04 LAB — APTT: aPTT: 29 seconds (ref 24–37)

## 2015-05-04 NOTE — Pre-Procedure Instructions (Addendum)
Chelsea Mcintyre  05/04/2015      [id number], [place] [id number] [place] [id number] Phone: [phone] Fax: [phone]    Your procedure is scheduled on 05/15/15  Report to [facility] short stay admitting at 1015 A.M.  Call this number if you have problems the morning of surgery:  [phone]   Remember:  Do not eat food or drink liquids after midnight.  Take these medicines the morning of surgery with A SIP OF WATER  nadolol     STOP all herbel meds, nsaids (aleve,naproxen,advil,ibuprofen) 5 days prior to surgery starting 05/10/15 including aspirin, oscal, vit D, multi,tumeric   Do not wear jewelry, make-up or nail polish.  Do not wear lotions, powders, or perfumes.  You may wear deodorant.  Do not shave 48 hours prior to surgery.  Men may shave face and neck.  Do not bring valuables to the hospital.  [facility] is not responsible for any belongings or valuables.  Contacts, dentures or bridgework may not be worn into surgery.  Leave your suitcase in the car.  After surgery it may be brought to your room.  For patients admitted to the hospital, discharge time will be determined by your treatment team.  Patients discharged the day of surgery will not be allowed to drive home.   Name and phone number of your driver:    Special instructions:   Special Instructions: [place] - Preparing for Surgery  Before surgery, you can play an important role.  Because skin is not sterile, your skin needs to be as free of germs as possible.  You can reduce the number of germs on you skin by washing with CHG (chlorahexidine gluconate) soap before surgery.  CHG is an antiseptic cleaner which kills germs and bonds with the skin to continue killing germs even after washing.  Please DO NOT use if you have an allergy to CHG or antibacterial soaps.  If your skin becomes reddened/irritated stop using the CHG and inform your nurse  when you arrive at Short Stay.  Do not shave (including legs and underarms) for at least 48 hours prior to the first CHG shower.  You may shave your face.  Please follow these instructions carefully:   1.  Shower with CHG Soap the night before surgery and the morning of Surgery.  2.  If you choose to wash your hair, wash your hair first as usual with your normal shampoo.  3.  After you shampoo, rinse your hair and body thoroughly to remove the Shampoo.  4.  Use CHG as you would any other liquid soap.  You can apply chg directly  to the skin and wash gently with scrungie or a clean washcloth.  5.  Apply the CHG Soap to your body ONLY FROM THE NECK DOWN.  Do not use on open wounds or open sores.  Avoid contact with your eyes ears, mouth and genitals (private parts).  Wash genitals (private parts)       with your normal soap.  6.  Wash thoroughly, paying special attention to the area where your surgery will be performed.  7.  Thoroughly rinse your body with warm water from the neck down.  8.  DO NOT shower/wash with your normal soap after using and rinsing off the CHG Soap.  9.  Pat yourself dry with a clean towel.  10.  Wear clean pajamas.            11.  Place clean sheets on your bed the night of your first shower and do not sleep with pets.  Day of Surgery  Do not apply any lotions/deodorants the morning of surgery.  Please wear clean clothes to the hospital/surgery center.  Please read over the following fact sheets that you were given. Pain Booklet, Coughing and Deep Breathing, Blood Transfusion Information, Total Joint Packet, MRSA Information and Surgical Site Infection Prevention

## 2015-05-07 ENCOUNTER — Ambulatory Visit: Payer: Self-pay | Admitting: Orthopedic Surgery

## 2015-05-07 NOTE — Progress Notes (Signed)
error 

## 2015-05-07 NOTE — H&P (Signed)
TOTAL HIP ADMISSION H&P  Patient is admitted for left total hip arthroplasty.  Subjective:  Chief Complaint: left hip pain  HPI: Chelsea Mcintyre, 81 y.o. female, has a history of pain and functional disability in the left hip(s) due to arthritis and patient has failed non-surgical conservative treatments for greater than 12 weeks to include NSAID's and/or analgesics, corticosteriod injections, flexibility and strengthening excercises, use of assistive devices and activity modification.  Onset of symptoms was gradual starting 6 years ago with gradually worsening course since that time.The patient noted no past surgery on the left hip(s).  Patient currently rates pain in the left hip at 10 out of 10 with activity. Patient has night pain, pain that interfers with activities of daily living, pain with passive range of motion and joint swelling. Patient has evidence of subchondral cysts, subchondral sclerosis, periarticular osteophytes and joint space narrowing by imaging studies. This condition presents safety issues increasing the risk of falls. There is no current active infection.  Patient Active Problem List   Diagnosis Date Noted  . Lumbar compression fracture ([place]) 04/09/2011   Past Medical History  Diagnosis Date  . MVP (mitral valve prolapse)   . Hypertension   . Dysrhythmia     if taking certain medications  . Recurrent upper respiratory infection (URI)     Dx 1st of september with sinus infection, placed on antibiotics at that time  . Arthritis     bilateral hips and bilateral shoulders.  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 Complication of anesthesia     heart stopped with the anesthesia x1,  difficult to wake up  . Family history of adverse reaction to anesthesia     sister slow to awaken- "almost died last time"  . Pneumonia Sep 14, 2022    hx  . Frequency of urination   . Cancer ([place])     skin back, nose  . Jaundice     as a child     Past Surgical History  Procedure Laterality Date  . Appendectomy    . Fibroid tumor      1970, right breast removed  . Fibroid cyst      ovary right removed 1966  . Fracture surgery      1988 shattered elbow left  . Hardware removal      left elbow 1998  . Tonsillectomy  44    and adenoids  . Breast surgery      1982,70rt  . Abdominal hysterectomy  75    partial  . Cardiac catheterization      1994, detected MVP  . Elbow fracture surgery Left 98,    hardware removed later in year  . Eye surgery      bilateral cataract surgery 2007 with lens implant  . Back surgery  2012    kyphoplasty     (Not in a hospital admission) Allergies  Allergen Reactions  . Sulfa Drugs Cross Reactors Shortness Of Breath and Rash  . Latex Hives  . Other Other (See Comments)    Gluten: bothers her stomach  . Tape Other (See Comments)    Layer of skin peels off and area becomes raw    Social History  Substance Use Topics  . Smoking status: Former Smoker -- 0.50 packs/day for 2 years  . Smokeless tobacco: Not on file     Comment: off and on, non smoker presently  . Alcohol Use:  No    Family History  Problem Relation Age of Onset  . Anesthesia problems Father      Review of Systems  Constitutional: Negative.   HENT: Negative.   Eyes: Negative.   Cardiovascular: Negative.   Gastrointestinal: Negative.   Genitourinary: Positive for frequency. Negative for dysuria, urgency, hematuria and flank pain.  Musculoskeletal: Positive for back pain and joint pain.  Skin: Negative.   Neurological: Negative.   Endo/Heme/Allergies: Negative.   Psychiatric/Behavioral: Negative.     Objective:  Physical Exam  Vitals reviewed. Constitutional: She is oriented to person, place, and time. She appears well-developed and well-nourished.  HENT:  Head: Normocephalic and atraumatic.  Eyes: Conjunctivae and EOM are normal. Pupils are equal, round, and reactive to light.  Neck: Normal range of  motion. Neck supple.  Cardiovascular: Normal rate, regular rhythm, normal heart sounds and intact distal pulses.   Respiratory: Effort normal and breath sounds normal. No respiratory distress.  GI: Soft. Bowel sounds are normal. She exhibits no distension.  Genitourinary:  deferred  Musculoskeletal:       Left hip: She exhibits decreased range of motion and bony tenderness.  Neurological: She is alert and oriented to person, place, and time. She has normal reflexes.  Skin: Skin is warm.  Psychiatric: She has a normal mood and affect. Her behavior is normal. Judgment and thought content normal.    Vital signs in last 24 hours: @VSRANGES @  Labs:   Estimated body mass index is 26.26 kg/(m^2) as calculated from the following:   Height as of 05/04/15: 5' 5.5" (1.664 m).   Weight as of 05/04/15: 72.712 kg (160 lb 4.8 oz).   Imaging Review Plain radiographs demonstrate severe degenerative joint disease of the left hip(s). The bone quality appears to be adequate for age and reported activity level.  Assessment/Plan:  End stage arthritis, left hip(s)  The patient history, physical examination, clinical judgement of the provider and imaging studies are consistent with end stage degenerative joint disease of the left hip(s) and total hip arthroplasty is deemed medically necessary. The treatment options including medical management, injection therapy, arthroscopy and arthroplasty were discussed at length. The risks and benefits of total hip arthroplasty were presented and reviewed. The risks due to aseptic loosening, infection, stiffness, dislocation/subluxation,  thromboembolic complications and other imponderables were discussed.  The patient acknowledged the explanation, agreed to proceed with the plan and consent was signed. Patient is being admitted for inpatient treatment for surgery, pain control, PT, OT, prophylactic antibiotics, VTE prophylaxis, progressive ambulation and ADL's and  discharge planning.The patient is planning to be discharged home with home health services

## 2015-05-08 NOTE — Progress Notes (Signed)
Anesthesia Chart Review:  Pt is 81 year old female scheduled for L total hip arthroplasty anterior approach on 05/15/2015 with Dr. Lyla Glassing.   PCP is Dr. Jeanine Luz, last office visit 04/19/15, who cleared pt for surgery.   PMH includes:  MVP, HTN, dysrhythmia (if taking certain meds). Former smoker. BMI 26. S/p kyphoplasty 04/09/11.   Anesthesia history: "heart stopped" with anesthesia once, difficult to wake up.   Medications include: ASA, losartan, nadolol.   Preoperative labs reviewed.    EKG 05/04/15: sinus bradycardia (57 bpm). Poor R wave progression.  Nuclear stress test 09/19/14:  - normal myocardial perfusion imaging study with fixed defect suggestive of an area of soft tissue attenuation - Overall LV systolic function normal without regional wall motion abnormalities. EF 70%  By notes, pt had normal coronaries by cath in 1994.   If no changes, I anticipate pt can proceed with surgery as scheduled.   Willeen Cass, FNP-BC [facility]/Anesthesiology Phone: [phone] 05/08/2015 2:13 PM

## 2015-05-11 MED ORDER — TRANEXAMIC ACID 1000 MG/10ML IV SOLN
1000.0000 mg | INTRAVENOUS | Status: AC
  Administered 2015-05-15: 1000 mg via INTRAVENOUS
  Filled 2015-05-11: qty 10

## 2015-05-14 MED ORDER — CEFAZOLIN SODIUM-DEXTROSE 2-3 GM-% IV SOLR
2.0000 g | INTRAVENOUS | Status: AC
  Administered 2015-05-15: 2 g via INTRAVENOUS
  Filled 2015-05-14: qty 50

## 2015-05-14 MED ORDER — SODIUM CHLORIDE 0.9 % IV SOLN: INTRAVENOUS | Status: DC

## 2015-05-14 MED ORDER — CHLORHEXIDINE GLUCONATE 4 % EX LIQD: 60.0000 mL | Freq: Once | CUTANEOUS | Status: DC

## 2015-05-15 ENCOUNTER — Inpatient Hospital Stay (HOSPITAL_COMMUNITY): Payer: Medicare Other | Admitting: Vascular Surgery

## 2015-05-15 ENCOUNTER — Inpatient Hospital Stay (HOSPITAL_COMMUNITY): Payer: Medicare Other

## 2015-05-15 ENCOUNTER — Encounter (HOSPITAL_COMMUNITY)
Admission: RE | Disposition: A | Discharge status: Home Health | Payer: Self-pay | Source: Ambulatory Visit | Attending: Orthopedic Surgery

## 2015-05-15 ENCOUNTER — Inpatient Hospital Stay (HOSPITAL_COMMUNITY): Payer: Medicare Other | Admitting: Emergency Medicine

## 2015-05-15 ENCOUNTER — Encounter (HOSPITAL_COMMUNITY): Payer: Self-pay | Admitting: Certified Registered Nurse Anesthetist

## 2015-05-15 ENCOUNTER — Inpatient Hospital Stay (HOSPITAL_COMMUNITY)
Admission: RE | Admit: 2015-05-15 | Discharge: 2015-05-17 | DRG: 470 | Disposition: A | Discharge status: Home Health | Payer: Medicare Other | Source: Ambulatory Visit | Attending: Orthopedic Surgery | Admitting: Orthopedic Surgery

## 2015-05-15 DIAGNOSIS — Z882 Allergy status to sulfonamides status: Secondary | ICD-10-CM

## 2015-05-15 DIAGNOSIS — Z91048 Other nonmedicinal substance allergy status: Secondary | ICD-10-CM

## 2015-05-15 DIAGNOSIS — Z419 Encounter for procedure for purposes other than remedying health state, unspecified: Secondary | ICD-10-CM

## 2015-05-15 DIAGNOSIS — C44301 Unspecified malignant neoplasm of skin of nose: Secondary | ICD-10-CM | POA: Diagnosis present

## 2015-05-15 DIAGNOSIS — I499 Cardiac arrhythmia, unspecified: Secondary | ICD-10-CM | POA: Diagnosis present

## 2015-05-15 DIAGNOSIS — C44509 Unspecified malignant neoplasm of skin of other part of trunk: Secondary | ICD-10-CM | POA: Diagnosis present

## 2015-05-15 DIAGNOSIS — Z91040 Latex allergy status: Secondary | ICD-10-CM | POA: Diagnosis not present

## 2015-05-15 DIAGNOSIS — M1612 Unilateral primary osteoarthritis, left hip: Secondary | ICD-10-CM

## 2015-05-15 DIAGNOSIS — I10 Essential (primary) hypertension: Secondary | ICD-10-CM | POA: Diagnosis present

## 2015-05-15 DIAGNOSIS — I341 Nonrheumatic mitral (valve) prolapse: Secondary | ICD-10-CM | POA: Diagnosis present

## 2015-05-15 DIAGNOSIS — Z9842 Cataract extraction status, left eye: Secondary | ICD-10-CM | POA: Diagnosis not present

## 2015-05-15 DIAGNOSIS — Z87891 Personal history of nicotine dependence: Secondary | ICD-10-CM | POA: Diagnosis not present

## 2015-05-15 DIAGNOSIS — Z91018 Allergy to other foods: Secondary | ICD-10-CM | POA: Diagnosis not present

## 2015-05-15 DIAGNOSIS — Z9841 Cataract extraction status, right eye: Secondary | ICD-10-CM | POA: Diagnosis not present

## 2015-05-15 DIAGNOSIS — Z09 Encounter for follow-up examination after completed treatment for conditions other than malignant neoplasm: Secondary | ICD-10-CM

## 2015-05-15 DIAGNOSIS — Z961 Presence of intraocular lens: Secondary | ICD-10-CM | POA: Diagnosis present

## 2015-05-15 HISTORY — PX: TOTAL HIP ARTHROPLASTY: SHX124

## 2015-05-15 HISTORY — DX: Unilateral primary osteoarthritis, left hip: M16.12

## 2015-05-15 SURGERY — ARTHROPLASTY, HIP, TOTAL, ANTERIOR APPROACH
Anesthesia: Monitor Anesthesia Care | Site: Hip | Laterality: Left

## 2015-05-15 MED ORDER — ACETAMINOPHEN 325 MG PO TABS: 650.0000 mg | ORAL_TABLET | Freq: Four times a day (QID) | ORAL | Status: DC | PRN

## 2015-05-15 MED ORDER — CALCIUM CARBONATE 1250 (500 CA) MG PO TABS
1.0000 | ORAL_TABLET | Freq: Every day | ORAL | Status: DC
  Administered 2015-05-16: 500 mg via ORAL
  Filled 2015-05-15: qty 1

## 2015-05-15 MED ORDER — DEXTROSE 5 % IV SOLN
10.0000 mg | INTRAVENOUS | Status: DC | PRN
  Administered 2015-05-15: 15 ug/min via INTRAVENOUS

## 2015-05-15 MED ORDER — POLYETHYL GLYCOL-PROPYL GLYCOL 0.4-0.3 % OP SOLN: 2.0000 [drp] | Freq: Three times a day (TID) | OPHTHALMIC | Status: DC | PRN

## 2015-05-15 MED ORDER — DOCUSATE SODIUM 100 MG PO CAPS
100.0000 mg | ORAL_CAPSULE | Freq: Two times a day (BID) | ORAL | Status: DC
  Administered 2015-05-15 – 2015-05-16 (×2): 100 mg via ORAL
  Filled 2015-05-15 (×3): qty 1

## 2015-05-15 MED ORDER — NONFORMULARY OR COMPOUNDED ITEM
5.0000 mg | Freq: Two times a day (BID) | Status: DC
  Administered 2015-05-15: 5 mg via ORAL
  Filled 2015-05-15 (×7): qty 1

## 2015-05-15 MED ORDER — HYDROCODONE-ACETAMINOPHEN 5-325 MG PO TABS
1.0000 | ORAL_TABLET | ORAL | Status: DC | PRN
  Administered 2015-05-15 – 2015-05-16 (×4): 1 via ORAL
  Filled 2015-05-15 (×5): qty 1

## 2015-05-15 MED ORDER — HYDROMORPHONE HCL 1 MG/ML IJ SOLN: 0.2500 mg | INTRAMUSCULAR | Status: DC | PRN

## 2015-05-15 MED ORDER — ONDANSETRON HCL 4 MG/2ML IJ SOLN
INTRAMUSCULAR | Status: DC | PRN
  Administered 2015-05-15: 4 mg via INTRAVENOUS

## 2015-05-15 MED ORDER — EPHEDRINE SULFATE 50 MG/ML IJ SOLN
INTRAMUSCULAR | Status: DC | PRN
  Administered 2015-05-15 (×6): 5 mg via INTRAVENOUS

## 2015-05-15 MED ORDER — SIMVASTATIN 10 MG PO TABS
5.0000 mg | ORAL_TABLET | Freq: Every day | ORAL | Status: DC
  Filled 2015-05-15 (×2): qty 1

## 2015-05-15 MED ORDER — ROCURONIUM BROMIDE 50 MG/5ML IV SOLN
INTRAVENOUS | Status: AC
  Filled 2015-05-15: qty 1

## 2015-05-15 MED ORDER — THERA M PLUS PO TABS: 1.0000 | ORAL_TABLET | Freq: Every day | ORAL | Status: DC

## 2015-05-15 MED ORDER — CEFAZOLIN SODIUM 1-5 GM-% IV SOLN
1.0000 g | Freq: Four times a day (QID) | INTRAVENOUS | Status: AC
  Administered 2015-05-15 – 2015-05-16 (×2): 1 g via INTRAVENOUS
  Filled 2015-05-15 (×3): qty 50

## 2015-05-15 MED ORDER — KETOROLAC TROMETHAMINE 30 MG/ML IJ SOLN
INTRAMUSCULAR | Status: DC | PRN
  Administered 2015-05-15: 30 mg

## 2015-05-15 MED ORDER — SODIUM CHLORIDE 0.9 % IR SOLN
Status: DC | PRN
  Administered 2015-05-15: 3000 mL

## 2015-05-15 MED ORDER — METOCLOPRAMIDE HCL 5 MG PO TABS: 5.0000 mg | ORAL_TABLET | Freq: Three times a day (TID) | ORAL | Status: DC | PRN

## 2015-05-15 MED ORDER — NADOLOL 20 MG PO TABS: 5.0000 mg | ORAL_TABLET | Freq: Two times a day (BID) | ORAL | Status: DC

## 2015-05-15 MED ORDER — DEXAMETHASONE SODIUM PHOSPHATE 10 MG/ML IJ SOLN
10.0000 mg | Freq: Once | INTRAMUSCULAR | Status: AC
  Administered 2015-05-16: 10 mg via INTRAVENOUS
  Filled 2015-05-15: qty 1

## 2015-05-15 MED ORDER — POLYVINYL ALCOHOL 1.4 % OP SOLN
2.0000 [drp] | Freq: Three times a day (TID) | OPHTHALMIC | Status: DC | PRN
  Administered 2015-05-16: 2 [drp] via OPHTHALMIC
  Filled 2015-05-15: qty 15

## 2015-05-15 MED ORDER — SODIUM CHLORIDE 0.9 % IR SOLN
Status: DC | PRN
  Administered 2015-05-15: 1000 mL

## 2015-05-15 MED ORDER — PHENYLEPHRINE HCL 10 MG/ML IJ SOLN
INTRAMUSCULAR | Status: DC | PRN
  Administered 2015-05-15: 20 ug via INTRAVENOUS
  Administered 2015-05-15: 80 ug via INTRAVENOUS
  Administered 2015-05-15: 20 ug via INTRAVENOUS

## 2015-05-15 MED ORDER — POVIDONE-IODINE 7.5 % EX SOLN
CUTANEOUS | Status: DC | PRN
  Administered 2015-05-15: 1 via TOPICAL

## 2015-05-15 MED ORDER — PHENOL 1.4 % MT LIQD: 1.0000 | OROMUCOSAL | Status: DC | PRN

## 2015-05-15 MED ORDER — LACTATED RINGERS IV SOLN
INTRAVENOUS | Status: DC
Start: 2015-05-15 — End: 2015-05-15
  Administered 2015-05-15: 50 mL/h via INTRAVENOUS
  Administered 2015-05-15: 16:00:00 via INTRAVENOUS

## 2015-05-15 MED ORDER — ONDANSETRON HCL 4 MG/2ML IJ SOLN: 4.0000 mg | Freq: Four times a day (QID) | INTRAMUSCULAR | Status: DC | PRN

## 2015-05-15 MED ORDER — MENTHOL 3 MG MT LOZG: 1.0000 | LOZENGE | OROMUCOSAL | Status: DC | PRN

## 2015-05-15 MED ORDER — ONDANSETRON HCL 4 MG PO TABS: 4.0000 mg | ORAL_TABLET | Freq: Four times a day (QID) | ORAL | Status: DC | PRN

## 2015-05-15 MED ORDER — PROMETHAZINE HCL 25 MG/ML IJ SOLN: 6.2500 mg | INTRAMUSCULAR | Status: DC | PRN

## 2015-05-15 MED ORDER — PROPOFOL 500 MG/50ML IV EMUL
INTRAVENOUS | Status: DC | PRN
  Administered 2015-05-15: 30 ug/kg/min via INTRAVENOUS

## 2015-05-15 MED ORDER — 0.9 % SODIUM CHLORIDE (POUR BTL) OPTIME
TOPICAL | Status: DC | PRN
  Administered 2015-05-15: 1000 mL

## 2015-05-15 MED ORDER — SODIUM CHLORIDE 0.9 % IV SOLN: INTRAVENOUS | Status: DC

## 2015-05-15 MED ORDER — BUPIVACAINE-EPINEPHRINE (PF) 0.5% -1:200000 IJ SOLN
INTRAMUSCULAR | Status: AC
  Filled 2015-05-15: qty 30

## 2015-05-15 MED ORDER — ADULT MULTIVITAMIN W/MINERALS CH
1.0000 | ORAL_TABLET | Freq: Every day | ORAL | Status: DC
  Administered 2015-05-16: 1 via ORAL
  Filled 2015-05-15: qty 1

## 2015-05-15 MED ORDER — ASPIRIN EC 325 MG PO TBEC
325.0000 mg | DELAYED_RELEASE_TABLET | Freq: Two times a day (BID) | ORAL | Status: DC
  Administered 2015-05-16 (×2): 325 mg via ORAL
  Filled 2015-05-15 (×2): qty 1

## 2015-05-15 MED ORDER — HYDROMORPHONE HCL 1 MG/ML IJ SOLN
0.5000 mg | INTRAMUSCULAR | Status: DC | PRN
  Administered 2015-05-15 – 2015-05-16 (×2): 0.5 mg via INTRAVENOUS
  Filled 2015-05-15 (×2): qty 1

## 2015-05-15 MED ORDER — SODIUM CHLORIDE 0.9 % IJ SOLN
INTRAMUSCULAR | Status: DC | PRN
  Administered 2015-05-15: 30 mL

## 2015-05-15 MED ORDER — BUPIVACAINE-EPINEPHRINE (PF) 0.5% -1:200000 IJ SOLN
INTRAMUSCULAR | Status: DC | PRN
  Administered 2015-05-15: 30 mL

## 2015-05-15 MED ORDER — KETOROLAC TROMETHAMINE 15 MG/ML IJ SOLN
7.5000 mg | Freq: Four times a day (QID) | INTRAMUSCULAR | Status: AC
  Administered 2015-05-15 – 2015-05-16 (×3): 7.5 mg via INTRAVENOUS
  Filled 2015-05-15 (×2): qty 1

## 2015-05-15 MED ORDER — PROPOFOL 10 MG/ML IV BOLUS
INTRAVENOUS | Status: AC
  Filled 2015-05-15: qty 20

## 2015-05-15 MED ORDER — ACETAMINOPHEN 650 MG RE SUPP: 650.0000 mg | Freq: Four times a day (QID) | RECTAL | Status: DC | PRN

## 2015-05-15 MED ORDER — FENTANYL CITRATE (PF) 100 MCG/2ML IJ SOLN
INTRAMUSCULAR | Status: DC | PRN
  Administered 2015-05-15: 50 ug via INTRAVENOUS
  Administered 2015-05-15: 25 ug via INTRAVENOUS

## 2015-05-15 MED ORDER — BUPIVACAINE IN DEXTROSE 0.75-8.25 % IT SOLN
INTRATHECAL | Status: DC | PRN
  Administered 2015-05-15: 15 mg via INTRATHECAL

## 2015-05-15 MED ORDER — LOSARTAN POTASSIUM 25 MG PO TABS
25.0000 mg | ORAL_TABLET | Freq: Two times a day (BID) | ORAL | Status: DC
  Administered 2015-05-15 – 2015-05-16 (×2): 25 mg via ORAL
  Filled 2015-05-15 (×3): qty 1

## 2015-05-15 MED ORDER — METOCLOPRAMIDE HCL 5 MG/ML IJ SOLN: 5.0000 mg | Freq: Three times a day (TID) | INTRAMUSCULAR | Status: DC | PRN

## 2015-05-15 MED ORDER — FENTANYL CITRATE (PF) 250 MCG/5ML IJ SOLN
INTRAMUSCULAR | Status: AC
  Filled 2015-05-15: qty 5

## 2015-05-15 MED ORDER — SENNA 8.6 MG PO TABS
2.0000 | ORAL_TABLET | Freq: Every day | ORAL | Status: DC
  Administered 2015-05-15: 17.2 mg via ORAL
  Filled 2015-05-15 (×2): qty 2

## 2015-05-15 MED ORDER — DEXAMETHASONE SODIUM PHOSPHATE 10 MG/ML IJ SOLN
INTRAMUSCULAR | Status: DC | PRN
  Administered 2015-05-15: 10 mg via INTRAVENOUS

## 2015-05-15 SURGICAL SUPPLY — 62 items
ALCOHOL ISOPROPYL (RUBBING) (MISCELLANEOUS) ×3 IMPLANT
BLADE SURG ROTATE 9660 (MISCELLANEOUS) IMPLANT
CAPT HIP TOTAL 2 ×3 IMPLANT
CHLORAPREP W/TINT 26ML (MISCELLANEOUS) ×3 IMPLANT
COVER SURGICAL LIGHT HANDLE (MISCELLANEOUS) ×3 IMPLANT
DERMABOND ADVANCED (GAUZE/BANDAGES/DRESSINGS) ×2
DERMABOND ADVANCED .7 DNX12 (GAUZE/BANDAGES/DRESSINGS) ×1 IMPLANT
DRAPE C-ARM 42X72 X-RAY (DRAPES) ×3 IMPLANT
DRAPE IMP U-DRAPE 54X76 (DRAPES) ×6 IMPLANT
DRAPE STERI IOBAN 125X83 (DRAPES) ×3 IMPLANT
DRAPE U-SHAPE 47X51 STRL (DRAPES) ×9 IMPLANT
DRSG AQUACEL AG ADV 3.5X10 (GAUZE/BANDAGES/DRESSINGS) ×3 IMPLANT
ELECT BLADE 4.0 EZ CLEAN MEGAD (MISCELLANEOUS) ×3
ELECT REM PT RETURN 9FT ADLT (ELECTROSURGICAL) ×3
ELECTRODE BLDE 4.0 EZ CLN MEGD (MISCELLANEOUS) ×1 IMPLANT
ELECTRODE REM PT RTRN 9FT ADLT (ELECTROSURGICAL) ×1 IMPLANT
EVACUATOR 1/8 PVC DRAIN (DRAIN) IMPLANT
GLOVE BIO SURGEON STRL SZ8.5 (GLOVE) IMPLANT
GLOVE BIOGEL PI IND STRL 6.5 (GLOVE) ×2 IMPLANT
GLOVE BIOGEL PI IND STRL 7.5 (GLOVE) ×1 IMPLANT
GLOVE BIOGEL PI IND STRL 8.5 (GLOVE) ×1 IMPLANT
GLOVE BIOGEL PI INDICATOR 6.5 (GLOVE) ×4
GLOVE BIOGEL PI INDICATOR 7.5 (GLOVE) ×2
GLOVE BIOGEL PI INDICATOR 8.5 (GLOVE) ×2
GLOVE SURG SS PI 6.0 STRL IVOR (GLOVE) ×6 IMPLANT
GLOVE SURG SS PI 6.5 STRL IVOR (GLOVE) ×3 IMPLANT
GLOVE SURG SS PI 7.0 STRL IVOR (GLOVE) ×3 IMPLANT
GLOVE SURG SS PI 8.0 STRL IVOR (GLOVE) ×6 IMPLANT
GLOVE SURG SS PI 8.5 STRL IVOR (GLOVE) ×4
GLOVE SURG SS PI 8.5 STRL STRW (GLOVE) ×2 IMPLANT
GOWN STRL REUS W/ TWL LRG LVL3 (GOWN DISPOSABLE) ×3 IMPLANT
GOWN STRL REUS W/TWL 2XL LVL3 (GOWN DISPOSABLE) ×6 IMPLANT
GOWN STRL REUS W/TWL LRG LVL3 (GOWN DISPOSABLE) ×6
HANDPIECE INTERPULSE COAX TIP (DISPOSABLE) ×2
HOOD PEEL AWAY FACE SHEILD DIS (HOOD) ×6 IMPLANT
KIT BASIN OR (CUSTOM PROCEDURE TRAY) ×3 IMPLANT
KIT ROOM TURNOVER OR (KITS) ×3 IMPLANT
MANIFOLD NEPTUNE II (INSTRUMENTS) ×3 IMPLANT
MARKER SKIN DUAL TIP RULER LAB (MISCELLANEOUS) ×6 IMPLANT
NEEDLE SPNL 18GX3.5 QUINCKE PK (NEEDLE) ×3 IMPLANT
NS IRRIG 1000ML POUR BTL (IV SOLUTION) ×3 IMPLANT
PACK TOTAL JOINT (CUSTOM PROCEDURE TRAY) ×3 IMPLANT
PACK UNIVERSAL I (CUSTOM PROCEDURE TRAY) ×3 IMPLANT
PAD ARMBOARD 7.5X6 YLW CONV (MISCELLANEOUS) ×6 IMPLANT
SAW OSC TIP CART 19.5X105X1.3 (SAW) ×3 IMPLANT
SEALER BIPOLAR AQUA 6.0 (INSTRUMENTS) ×3 IMPLANT
SET HNDPC FAN SPRY TIP SCT (DISPOSABLE) ×1 IMPLANT
SOLUTION BETADINE 4OZ (MISCELLANEOUS) ×3 IMPLANT
SUCTION FRAZIER TIP 10 FR DISP (SUCTIONS) ×3 IMPLANT
SUT ETHIBOND NAB CT1 #1 30IN (SUTURE) ×6 IMPLANT
SUT MNCRL AB 3-0 PS2 18 (SUTURE) ×3 IMPLANT
SUT MON AB 2-0 CT1 36 (SUTURE) ×3 IMPLANT
SUT VIC AB 1 CT1 27 (SUTURE) ×2
SUT VIC AB 1 CT1 27XBRD ANBCTR (SUTURE) ×1 IMPLANT
SUT VIC AB 2-0 CT1 27 (SUTURE) ×2
SUT VIC AB 2-0 CT1 TAPERPNT 27 (SUTURE) ×1 IMPLANT
SUT VLOC 180 0 24IN GS25 (SUTURE) ×3 IMPLANT
SYR 50ML LL SCALE MARK (SYRINGE) ×3 IMPLANT
TOWEL OR 17X24 6PK STRL BLUE (TOWEL DISPOSABLE) ×3 IMPLANT
TOWEL OR 17X26 10 PK STRL BLUE (TOWEL DISPOSABLE) ×3 IMPLANT
TRAY FOLEY CATH 16FR SILVER (SET/KITS/TRAYS/PACK) IMPLANT
WATER STERILE IRR 1000ML POUR (IV SOLUTION) ×9 IMPLANT

## 2015-05-15 NOTE — Anesthesia Postprocedure Evaluation (Signed)
Anesthesia Post Note  Patient: Chelsea Mcintyre  Procedure(s) Performed: Procedure(s) (LRB): LEFT TOTAL HIP ARTHROPLASTY ANTERIOR APPROACH (Left)  Patient location during evaluation: PACU Anesthesia Type: Spinal Level of consciousness: oriented and awake and alert Pain management: pain level controlled Vital Signs Assessment: post-procedure vital signs reviewed and stable Respiratory status: spontaneous breathing, respiratory function stable and patient connected to nasal cannula oxygen Cardiovascular status: blood pressure returned to baseline and stable Postop Assessment: no headache, no backache and spinal receding Anesthetic complications: no    Last Vitals:  Filed Vitals:   05/15/15 1647 05/15/15 1705  BP: 104/57 125/73  Pulse: 57 55  Temp:    Resp: 12 14                  [name]

## 2015-05-15 NOTE — Progress Notes (Signed)
Out of sacral dressings, spd to bring.

## 2015-05-15 NOTE — Anesthesia Procedure Notes (Signed)
Spinal Patient location during procedure: OR Start time: 05/15/2015 2:18 PM End time: 05/15/2015 2:28 PM Staffing Anesthesiologist: Duane Boston Performed by: anesthesiologist  Preanesthetic Checklist Completed: patient identified, surgical consent, pre-op evaluation, timeout performed, IV checked, risks and benefits discussed and monitors and equipment checked Spinal Block Patient position: sitting Prep: DuraPrep Patient monitoring: cardiac monitor, continuous pulse ox and blood pressure Approach: midline Location: L3-4 Injection technique: single-shot Needle Needle type: Pencan  Needle gauge: 24 G Needle length: 9 cm Additional Notes Functioning IV was confirmed and monitors were applied. Sterile prep and drape, including hand hygiene and sterile gloves were used. The patient was positioned and the spine was prepped. The skin was anesthetized with lidocaine.  Free flow of clear CSF was obtained prior to injecting local anesthetic into the CSF.  The spinal needle aspirated freely following injection.  The needle was carefully withdrawn.  The patient tolerated the procedure well.

## 2015-05-15 NOTE — Transfer of Care (Signed)
Immediate Anesthesia Transfer of Care Note  Patient: Chelsea Mcintyre  Procedure(s) Performed: Procedure(s): LEFT TOTAL HIP ARTHROPLASTY ANTERIOR APPROACH (Left)  Patient Location: PACU  Anesthesia Type:General  Level of Consciousness: awake and patient cooperative  Airway & Oxygen Therapy: Patient Spontanous Breathing  Post-op Assessment: Report given to RN and Post -op Vital signs reviewed and stable  Post vital signs: Reviewed and stable  Last Vitals:  Filed Vitals:   05/15/15 0955  BP: 164/64  Pulse: 63  Temp: 37.1 C  Resp: 18    Complications: No apparent anesthesia complications

## 2015-05-15 NOTE — H&P (View-Only) (Signed)
TOTAL HIP ADMISSION H&P  Patient is admitted for left total hip arthroplasty.  Subjective:  Chief Complaint: left hip pain  HPI: Chelsea Mcintyre, 81 y.o. female, has a history of pain and functional disability in the left hip(s) due to arthritis and patient has failed non-surgical conservative treatments for greater than 12 weeks to include NSAID's and/or analgesics, corticosteriod injections, flexibility and strengthening excercises, use of assistive devices and activity modification.  Onset of symptoms was gradual starting 6 years ago with gradually worsening course since that time.The patient noted no past surgery on the left hip(s).  Patient currently rates pain in the left hip at 10 out of 10 with activity. Patient has night pain, pain that interfers with activities of daily living, pain with passive range of motion and joint swelling. Patient has evidence of subchondral cysts, subchondral sclerosis, periarticular osteophytes and joint space narrowing by imaging studies. This condition presents safety issues increasing the risk of falls. There is no current active infection.  Patient Active Problem List   Diagnosis Date Noted  . Lumbar compression fracture ([place]) 04/09/2011   Past Medical History  Diagnosis Date  . MVP (mitral valve prolapse)   . Hypertension   . Dysrhythmia     if taking certain medications  . Recurrent upper respiratory infection (URI)     Dx 1st of september with sinus infection, placed on antibiotics at that time  . Arthritis     bilateral hips and bilateral shoulders.  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 Complication of anesthesia     heart stopped with the anesthesia x1,  difficult to wake up  . Family history of adverse reaction to anesthesia     sister slow to awaken- "almost died last time"  . Pneumonia September 04, 2022    hx  . Frequency of urination   . Cancer ([place])     skin back, nose  . Jaundice     as a child     Past Surgical History  Procedure Laterality Date  . Appendectomy    . Fibroid tumor      1970, right breast removed  . Fibroid cyst      ovary right removed 1966  . Fracture surgery      1988 shattered elbow left  . Hardware removal      left elbow 1998  . Tonsillectomy  44    and adenoids  . Breast surgery      1982,70rt  . Abdominal hysterectomy  75    partial  . Cardiac catheterization      1994, detected MVP  . Elbow fracture surgery Left 98,    hardware removed later in year  . Eye surgery      bilateral cataract surgery 2007 with lens implant  . Back surgery  2012    kyphoplasty     (Not in a hospital admission) Allergies  Allergen Reactions  . Sulfa Drugs Cross Reactors Shortness Of Breath and Rash  . Latex Hives  . Other Other (See Comments)    Gluten: bothers her stomach  . Tape Other (See Comments)    Layer of skin peels off and area becomes raw    Social History  Substance Use Topics  . Smoking status: Former Smoker -- 0.50 packs/day for 2 years  . Smokeless tobacco: Not on file     Comment: off and on, non smoker presently  . Alcohol Use:  No    Family History  Problem Relation Age of Onset  . Anesthesia problems Father      Review of Systems  Constitutional: Negative.   HENT: Negative.   Eyes: Negative.   Cardiovascular: Negative.   Gastrointestinal: Negative.   Genitourinary: Positive for frequency. Negative for dysuria, urgency, hematuria and flank pain.  Musculoskeletal: Positive for back pain and joint pain.  Skin: Negative.   Neurological: Negative.   Endo/Heme/Allergies: Negative.   Psychiatric/Behavioral: Negative.     Objective:  Physical Exam  Vitals reviewed. Constitutional: She is oriented to person, place, and time. She appears well-developed and well-nourished.  HENT:  Head: Normocephalic and atraumatic.  Eyes: Conjunctivae and EOM are normal. Pupils are equal, round, and reactive to light.  Neck: Normal range of  motion. Neck supple.  Cardiovascular: Normal rate, regular rhythm, normal heart sounds and intact distal pulses.   Respiratory: Effort normal and breath sounds normal. No respiratory distress.  GI: Soft. Bowel sounds are normal. She exhibits no distension.  Genitourinary:  deferred  Musculoskeletal:       Left hip: She exhibits decreased range of motion and bony tenderness.  Neurological: She is alert and oriented to person, place, and time. She has normal reflexes.  Skin: Skin is warm.  Psychiatric: She has a normal mood and affect. Her behavior is normal. Judgment and thought content normal.    Vital signs in last 24 hours: @VSRANGES @  Labs:   Estimated body mass index is 26.26 kg/(m^2) as calculated from the following:   Height as of 05/04/15: 5' 5.5" (1.664 m).   Weight as of 05/04/15: 72.712 kg (160 lb 4.8 oz).   Imaging Review Plain radiographs demonstrate severe degenerative joint disease of the left hip(s). The bone quality appears to be adequate for age and reported activity level.  Assessment/Plan:  End stage arthritis, left hip(s)  The patient history, physical examination, clinical judgement of the provider and imaging studies are consistent with end stage degenerative joint disease of the left hip(s) and total hip arthroplasty is deemed medically necessary. The treatment options including medical management, injection therapy, arthroscopy and arthroplasty were discussed at length. The risks and benefits of total hip arthroplasty were presented and reviewed. The risks due to aseptic loosening, infection, stiffness, dislocation/subluxation,  thromboembolic complications and other imponderables were discussed.  The patient acknowledged the explanation, agreed to proceed with the plan and consent was signed. Patient is being admitted for inpatient treatment for surgery, pain control, PT, OT, prophylactic antibiotics, VTE prophylaxis, progressive ambulation and ADL's and  discharge planning.The patient is planning to be discharged home with home health services

## 2015-05-15 NOTE — Anesthesia Preprocedure Evaluation (Addendum)
Anesthesia Evaluation  Patient identified by MRN, date of birth, ID band Patient awake    Reviewed: Allergy & Precautions, NPO status , Patient's Chart, lab work & pertinent test results, reviewed documented beta blocker date and time   History of Anesthesia Complications (+) Family history of anesthesia reaction and history of anesthetic complications  Airway Mallampati: II  TM Distance: >3 FB Neck ROM: Full    Dental  (+) Teeth Intact, Dental Advisory Given, Caps   Pulmonary neg pulmonary ROS, former smoker,    Pulmonary exam normal        Cardiovascular hypertension, Pt. on medications and Pt. on home beta blockers Normal cardiovascular exam  Nuclear stress test 09/19/14:  - normal myocardial perfusion imaging study with fixed defect suggestive of an area of soft tissue attenuation - Overall LV systolic function normal without regional wall motion abnormalities. EF 70%    Neuro/Psych negative neurological ROS  negative psych ROS   GI/Hepatic negative GI ROS, Neg liver ROS,   Endo/Other  negative endocrine ROS  Renal/GU negative Renal ROS     Musculoskeletal   Abdominal   Peds  Hematology Porphyria variegata    Anesthesia Other Findings   Reproductive/Obstetrics                         Anesthesia Physical Anesthesia Plan  ASA: III  Anesthesia Plan: MAC and Spinal   Post-op Pain Management:    Induction: Intravenous  Airway Management Planned: Simple Face Mask  Additional Equipment:   Intra-op Plan:   Post-operative Plan:   Informed Consent: I have reviewed the patients History and Physical, chart, labs and discussed the procedure including the risks, benefits and alternatives for the proposed anesthesia with the patient or authorized representative who has indicated his/her understanding and acceptance.   Dental advisory given  Plan Discussed with: CRNA, Anesthesiologist and  Surgeon  Anesthesia Plan Comments:        Anesthesia Quick Evaluation

## 2015-05-15 NOTE — Op Note (Signed)
OPERATIVE REPORT  SURGEON: Rod Can, MD   ASSISTANT: Ky Barban, RNFA.  PREOPERATIVE DIAGNOSIS: Left hip arthritis.   POSTOPERATIVE DIAGNOSIS: Left hip arthritis.   PROCEDURE: Left total hip arthroplasty, anterior approach.   IMPLANTS: DePuy Tri Lock stem, size 5, std offset. DePuy Pinnacle Cup, size 54 mm. DePuy Altrx liner, size 32 by 54 mm, neutral. DePuy Biolox ceramic head ball, size 32 + 1 mm.  ANESTHESIA:  Spinal  ESTIMATED BLOOD LOSS: 300 mL.  ANTIBIOTICS: 2g ancef.  DRAINS: None.  COMPLICATIONS: None.   CONDITION: PACU - hemodynamically stable.  BRIEF CLINICAL NOTE: Chelsea Mcintyre is a 81 y.o. female with a long-standing history of Left hip arthritis. After failing conservative management, the patient was indicated for total hip arthroplasty. The risks, benefits, and alternatives to the procedure were explained, and the patient elected to proceed.  PROCEDURE IN DETAIL: Surgical site was marked by myself. Spinal anesthesia was obtained in the pre-op holding area. Once inside the operative room, a foley catheter was inserted. The patient was then positioned on the Hana table. All bony prominences were well padded. The hip was prepped and draped in the normal sterile surgical fashion. A time-out was called verifying side and site of surgery. The patient received IV antibiotics within 60 minutes of beginning the procedure.  The direct anterior approach to the hip was performed through the Hueter interval. Lateral femoral circumflex vessels were treated with the Auqumantys. The anterior capsule was exposed and an inverted T capsulotomy was made.The femoral neck cut was made to the level of the templated cut. A corkscrew was placed into the head and the head was removed. The femoral head was found to have eburnated bone. The head was passed to the back table and was measured.  Acetabular exposure was achieved, and the pulvinar and labrum were excised.  Sequental reaming of the acetabulum was then performed up to a size 53 mm reamer. A 54 mm cup was then opened and impacted into place at approximately 40 degrees of abduction and 20 degrees of anteversion. The final polyethylene liner was impacted into place and acetabular osteophytes were removed.   I then gained femoral exposure taking care to protect the abductors and greater trochanter. This was performed using standard external rotation, extension, and adduction. The capsule was peeled off the inner aspect of the greater trochanter, taking care to preserve the short external rotators. A cookie cutter was used to enter the femoral canal, and then the femoral canal finder was placed. Sequential broaching was performed up to a size 5. Calcar planer was used on the femoral neck remnant. I placed a std offset neck and a trial head ball. The hip was reduced. Leg lengths and offset were checked fluoroscopically. The hip was dislocated and trial components were removed. The final implants were placed, and the hip was reduced.  Fluoroscopy was used to confirm component position and leg lengths. At 90 degrees of external rotation and full extension, the hip was stable to an anterior directed force.  The wound was copiously irrigated with a dilute betadine solution followed by normal saline. Marcaine solution was injected into the periarticular soft tissue. The wound was closed in layers using #1 Vicryl and V-Loc for the fascia, 2-0 Vicryl for the subcutaneous fat, 2-0 Monocryl for the deep dermal layer, 3-0 running Monocryl subcuticular stitch, and Dermabond for the skin. Once the glue was fully dried, an Aquacell Ag dressing was applied. The patient was transported to the recovery room in  stable condition. Sponge, needle, and instrument counts were correct at the end of the case x2. The patient tolerated the procedure well and there were no known complications.

## 2015-05-15 NOTE — Discharge Instructions (Signed)
°Dr. Hari Casaus °Joint Replacement Specialist °[facility] °[address] °[place], [place] [address] °[phone] ° ° °TOTAL HIP REPLACEMENT POSTOPERATIVE DIRECTIONS ° ° ° °Hip Rehabilitation, Guidelines Following Surgery  ° °WEIGHT BEARING °Weight bearing as tolerated with assist device (walker, cane, etc) as directed, use it as long as suggested by your surgeon or therapist, typically at least 4-6 weeks. ° °The results of a hip operation are greatly improved after range of motion and muscle strengthening exercises. Follow all safety measures which are given to protect your hip. If any of these exercises cause increased pain or swelling in your joint, decrease the amount until you are comfortable again. Then slowly increase the exercises. Call your caregiver if you have problems or questions.  ° °HOME CARE INSTRUCTIONS  °Most of the following instructions are designed to prevent the dislocation of your new hip.  °Remove items at home which could result in a fall. This includes throw rugs or furniture in walking pathways.  °Continue medications as instructed at time of discharge. °· You may have some home medications which will be placed on hold until you complete the course of blood thinner medication. °· You may start showering once you are discharged home. Do not remove your dressing. °Do not put on socks or shoes without following the instructions of your caregivers.   °Sit on chairs with arms. Use the chair arms to help push yourself up when arising.  °Arrange for the use of a toilet seat elevator so you are not sitting low.  °· Walk with walker as instructed.  °You may resume a sexual relationship in one month or when given the OK by your caregiver.  °Use walker as long as suggested by your caregivers.  °You may put full weight on your legs and walk as much as is comfortable. °Avoid periods of inactivity such as sitting longer than an hour when not asleep. This helps prevent  blood clots.  °You may return to work once you are cleared by your surgeon.  °Do not drive a car for 6 weeks or until released by your surgeon.  °Do not drive while taking narcotics.  °Wear elastic stockings for two weeks following surgery during the day but you may remove then at night.  °Make sure you keep all of your appointments after your operation with all of your doctors and caregivers. You should call the office at the above phone number and make an appointment for approximately two weeks after the date of your surgery. °Please pick up a stool softener and laxative for home use as long as you are requiring pain medications. °· ICE to the affected hip every three hours for 30 minutes at a time and then as needed for pain and swelling. Continue to use ice on the hip for pain and swelling from surgery. You may notice swelling that will progress down to the foot and ankle.  This is normal after surgery.  Elevate the leg when you are not up walking on it.   °It is important for you to complete the blood thinner medication as prescribed by your doctor. °· Continue to use the breathing machine which will help keep your temperature down.  It is common for your temperature to cycle up and down following surgery, especially at night when you are not up moving around and exerting yourself.  The breathing machine keeps your lungs expanded and your temperature down. ° °RANGE OF MOTION AND STRENGTHENING EXERCISES  °These exercises are   designed to help you keep full movement of your hip joint. Follow your caregiver's or physical therapist's instructions. Perform all exercises about fifteen times, three times per day or as directed. Exercise both hips, even if you have had only one joint replacement. These exercises can be done on a training (exercise) mat, on the floor, on a table or on a bed. Use whatever works the best and is most comfortable for you. Use music or television while you are exercising so that the exercises  are a pleasant break in your day. This will make your life better with the exercises acting as a break in routine you can look forward to.  °Lying on your back, slowly slide your foot toward your buttocks, raising your knee up off the floor. Then slowly slide your foot back down until your leg is straight again.  °Lying on your back spread your legs as far apart as you can without causing discomfort.  °Lying on your side, raise your upper leg and foot straight up from the floor as far as is comfortable. Slowly lower the leg and repeat.  °Lying on your back, tighten up the muscle in the front of your thigh (quadriceps muscles). You can do this by keeping your leg straight and trying to raise your heel off the floor. This helps strengthen the largest muscle supporting your knee.  °Lying on your back, tighten up the muscles of your buttocks both with the legs straight and with the knee bent at a comfortable angle while keeping your heel on the floor.  ° °SKILLED REHAB INSTRUCTIONS: °If the patient is transferred to a skilled rehab facility following release from the hospital, a list of the current medications will be sent to the facility for the patient to continue.  When discharged from the skilled rehab facility, please have the facility set up the patient's Home Health Physical Therapy prior to being released. Also, the skilled facility will be responsible for providing the patient with their medications at time of release from the facility to include their pain medication and their blood thinner medication. If the patient is still at the rehab facility at time of the two week follow up appointment, the skilled rehab facility will also need to assist the patient in arranging follow up appointment in our office and any transportation needs. ° °MAKE SURE YOU:  °Understand these instructions.  °Will watch your condition.  °Will get help right away if you are not doing well or get worse. ° °Pick up stool softner and  laxative for home use following surgery while on pain medications. °Do not remove your dressing. °The dressing is waterproof--it is OK to take showers. °Continue to use ice for pain and swelling after surgery. °Do not use any lotions or creams on the incision until instructed by your surgeon. °Total Hip Protocol. ° ° °

## 2015-05-15 NOTE — Interval H&P Note (Signed)
History and Physical Interval Note:  05/15/2015 1:25 PM  Chelsea Mcintyre  has presented today for surgery, with the diagnosis of [place]  The various methods of treatment have been discussed with the patient and family. After consideration of risks, benefits and other options for treatment, the patient has consented to  Procedure(s): LEFT TOTAL HIP ARTHROPLASTY ANTERIOR APPROACH (Left) as a surgical intervention .  The patient's history has been reviewed, patient examined, no change in status, stable for surgery.  I have reviewed the patient's chart and labs.  Questions were answered to the patient's satisfaction.     Jiana Lemaire, Horald Pollen

## 2015-05-15 NOTE — Progress Notes (Signed)
Utilization review completed.  

## 2015-05-16 ENCOUNTER — Encounter (HOSPITAL_COMMUNITY): Payer: Self-pay | Admitting: Orthopedic Surgery

## 2015-05-16 LAB — CBC
HCT: 35.8 % — ABNORMAL LOW (ref 36.0–46.0)
HEMOGLOBIN: 11.9 g/dL — AB (ref 12.0–15.0)
MCH: 30.4 pg (ref 26.0–34.0)
MCHC: 33.2 g/dL (ref 30.0–36.0)
MCV: 91.6 fL (ref 78.0–100.0)
Platelets: 165 10*3/uL (ref 150–400)
RBC: 3.91 MIL/uL (ref 3.87–5.11)
RDW: 12.4 % (ref 11.5–15.5)
WBC: 14.1 10*3/uL — ABNORMAL HIGH (ref 4.0–10.5)

## 2015-05-16 LAB — BASIC METABOLIC PANEL
Anion gap: 8 (ref 5–15)
BUN: 18 mg/dL (ref 6–20)
CALCIUM: 8.4 mg/dL — AB (ref 8.9–10.3)
CHLORIDE: 107 mmol/L (ref 101–111)
CO2: 25 mmol/L (ref 22–32)
CREATININE: 0.81 mg/dL (ref 0.44–1.00)
GFR calc Af Amer: >60 mL/min (ref >60)
GFR calc non Af Amer: >60 mL/min (ref >60)
GLUCOSE: 133 mg/dL — AB (ref 65–99)
Potassium: 4 mmol/L (ref 3.5–5.1)
Sodium: 140 mmol/L (ref 135–145)

## 2015-05-16 MED ORDER — HYDROCODONE-ACETAMINOPHEN 5-325 MG PO TABS: 1.0000 | ORAL_TABLET | ORAL | Status: DC | PRN

## 2015-05-16 MED ORDER — DOCUSATE SODIUM 100 MG PO CAPS: 100.0000 mg | ORAL_CAPSULE | Freq: Two times a day (BID) | ORAL | Status: DC

## 2015-05-16 MED ORDER — ASPIRIN EC 325 MG PO TBEC: 325.0000 mg | DELAYED_RELEASE_TABLET | Freq: Two times a day (BID) | ORAL | Status: DC

## 2015-05-16 MED ORDER — SENNA 8.6 MG PO TABS: 2.0000 | ORAL_TABLET | Freq: Every day | ORAL | Status: DC

## 2015-05-16 MED ORDER — ONDANSETRON HCL 4 MG PO TABS: 4.0000 mg | ORAL_TABLET | Freq: Four times a day (QID) | ORAL | Status: DC | PRN

## 2015-05-16 MED FILL — Nadolol Tab 20 MG: ORAL | Qty: 0.25 | Status: AC

## 2015-05-16 NOTE — Progress Notes (Signed)
   Subjective:  Patient reports pain as mild to moderate.  No c/o. Denies N/V/CP/SOB.  Objective:   VITALS:   Filed Vitals:   05/15/15 1836 05/15/15 2059 05/16/15 0011 05/16/15 0420  BP: 158/72 154/71 113/52 113/62  Pulse: 66 78 72 73  Temp: 97.6 F (36.4 C) 97.5 F (36.4 C) 98.2 F (36.8 C) 98.2 F (36.8 C)  TempSrc: Oral Axillary Oral Oral  Resp: 18 18 16 16   Weight:      SpO2: 94% 97% 92% 94%    ABD soft Sensation intact distally Intact pulses distally Dorsiflexion/Plantar flexion intact Incision: dressing C/D/I Compartment soft   Lab Results  Component Value Date   WBC 14.1* 05/16/2015   HGB 11.9* 05/16/2015   HCT 35.8* 05/16/2015   MCV 91.6 05/16/2015   PLT 165 05/16/2015   BMET    Component Value Date/Time   NA 140 05/16/2015 0704   K 4.0 05/16/2015 0704   CL 107 05/16/2015 0704   CO2 25 05/16/2015 0704   GLUCOSE 133* 05/16/2015 0704   BUN 18 05/16/2015 0704   CREATININE 0.81 05/16/2015 0704   CALCIUM 8.4* 05/16/2015 0704   GFRNONAA >60 05/16/2015 0704   GFRAA >60 05/16/2015 0704     Assessment/Plan: 1 Day Post-Op   Principal Problem:   Primary osteoarthritis of left hip   WBAT with walker DVT ppx: ASA, SCDs, TEDs PO pain control PT/OT Dispo: d/c home after clears therapy with HHPT   Chelsea Mcintyre, Horald Pollen 05/16/2015, 8:28 AM   Rod Can, MD Cell [phone]

## 2015-05-16 NOTE — Discharge Summary (Signed)
Physician Discharge Summary  Patient ID: Chelsea Mcintyre MRN: [id number] DOB/AGE: [birth year] 81 y.o.  Admit date: 05/15/2015 Discharge date: 05/17/2015  Admission Diagnoses:  Primary osteoarthritis of left hip  Discharge Diagnoses:  Principal Problem:   Primary osteoarthritis of left hip   Past Medical History  Diagnosis Date  . MVP (mitral valve prolapse)   . Hypertension   . Dysrhythmia     if taking certain medications  . Recurrent upper respiratory infection (URI)     Dx 1st of september with sinus infection, placed on antibiotics at that time  . Arthritis     bilateral hips and bilateral shoulders.  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 Complication of anesthesia     heart stopped with the anesthesia x1,  difficult to wake up  . Family history of adverse reaction to anesthesia     sister slow to awaken- "almost died last time"  . Pneumonia August 27, 2022    hx  . Frequency of urination   . Cancer ([place])     skin back, nose  . Jaundice     as a child    Surgeries: Procedure(s): LEFT TOTAL HIP ARTHROPLASTY ANTERIOR APPROACH on 05/15/2015   Consultants (if any):    Discharged Condition: Improved  Hospital Course: Chelsea Mcintyre is an 81 y.o. female who was admitted 05/15/2015 with a diagnosis of Primary osteoarthritis of left hip and went to the operating room on 05/15/2015 and underwent the above named procedures.    She was given perioperative antibiotics:      Anti-infectives    Start     Dose/Rate Route Frequency Ordered Stop   05/15/15 2100  ceFAZolin (ANCEF) IVPB 1 g/50 mL premix     1 g 100 mL/hr over 30 Minutes Intravenous Every 6 hours 05/15/15 1837 05/16/15 0444   05/15/15 1200  ceFAZolin (ANCEF) IVPB 2 g/50 mL premix     2 g 100 mL/hr over 30 Minutes Intravenous To ShortStay Surgical 05/14/15 1057 05/15/15 1424    .  She was given sequential compression devices, early ambulation, and ASA for DVT  prophylaxis.  She benefited maximally from the hospital stay and there were no complications.    Recent vital signs:  Filed Vitals:   05/16/15 2210 05/17/15 0444  BP: 123/49 109/52  Pulse: 71 82  Temp:  98.2 F (36.8 C)  Resp:  16    Recent laboratory studies:  Lab Results  Component Value Date   HGB 10.5* 05/17/2015   HGB 11.9* 05/16/2015   HGB 14.6 05/04/2015   Lab Results  Component Value Date   WBC 11.5* 05/17/2015   PLT 145* 05/17/2015   Lab Results  Component Value Date   INR 1.12 05/04/2015   Lab Results  Component Value Date   NA 140 05/16/2015   K 4.0 05/16/2015   CL 107 05/16/2015   CO2 25 05/16/2015   BUN 18 05/16/2015   CREATININE 0.81 05/16/2015   GLUCOSE 133* 05/16/2015    Discharge Medications:     Medication List    TAKE these medications        aspirin EC 325 MG tablet  Take 1 tablet (325 mg total) by mouth 2 (two) times daily after a meal.     calcium carbonate 1250 (500 Ca) MG tablet  Commonly known as:  OS-CAL - dosed in mg of elemental calcium  Take 1 tablet by mouth daily.  cholecalciferol 1000 units tablet  Commonly known as:  VITAMIN D  Take 2,000 Units by mouth daily.     docusate sodium 100 MG capsule  Commonly known as:  COLACE  Take 1 capsule (100 mg total) by mouth 2 (two) times daily.     HYDROcodone-acetaminophen 5-325 MG tablet  Commonly known as:  NORCO  Take 1-2 tablets by mouth every 4 (four) hours as needed for moderate pain.     ibuprofen 200 MG tablet  Commonly known as:  ADVIL,MOTRIN  Take 400-600 mg by mouth at bedtime as needed. For pain     losartan 50 MG tablet  Commonly known as:  COZAAR  Take 25 mg by mouth 2 (two) times daily.     meclizine 25 MG tablet  Commonly known as:  ANTIVERT  Take 25 mg by mouth daily as needed. For dizzines     multivitamins ther. w/minerals Tabs tablet  Take 1 tablet by mouth daily.     nadolol 20 MG tablet  Commonly known as:  CORGARD  Take 5 mg by mouth 2  (two) times daily.     ondansetron 4 MG tablet  Commonly known as:  ZOFRAN  Take 1 tablet (4 mg total) by mouth every 6 (six) hours as needed for nausea.     senna 8.6 MG Tabs tablet  Commonly known as:  SENOKOT  Take 2 tablets (17.2 mg total) by mouth at bedtime.     simvastatin 10 MG tablet  Commonly known as:  ZOCOR  Take 5 mg by mouth at bedtime.     SYSTANE 0.4-0.3 % Soln  Generic drug:  Polyethyl Glycol-Propyl Glycol  Apply 2 drops to eye 3 (three) times daily as needed. For dry eyes     TURMERIC PO  Take 1,000 mg by mouth daily.        Diagnostic Studies: Dg Pelvis Portable  05/15/2015  CLINICAL DATA:  Status post left hip replacement EXAM: PORTABLE PELVIS 1-2 VIEWS COMPARISON:  03/15/2008 FINDINGS: Left hip replacement is seen. No acute bony abnormality is noted. Air is noted in the surgical bed. IMPRESSION: Status post left hip replacement without acute abnormality. Electronically Signed   By: Inez Catalina M.D.   On: 05/15/2015 18:39   Dg Hip Operative Unilat With Pelvis Left  05/15/2015  CLINICAL DATA:  Left total hip arthroplasty, anterior approach. EXAM: OPERATIVE LEFT HIP (WITH PELVIS IF PERFORMED) 1 VIEWS TECHNIQUE: Fluoroscopic spot image(s) were submitted for interpretation post-operatively. COMPARISON:  None. FINDINGS: AP spot images of the left hip and lower pelvis demonstrate anatomic alignment of the left hip arthroplasty without acute complicating features. The radiologic technologist documented 24 sec of fluoroscopy time. IMPRESSION: Anatomic alignment post left total hip arthroplasty without acute complicating features. Electronically Signed   By: Evangeline Dakin M.D.   On: 05/15/2015 16:21    Disposition: 01-Home or Self Care  Discharge Instructions    Call MD / Call 911    Complete by:  As directed   If you experience chest pain or shortness of breath, CALL 911 and be transported to the hospital emergency room.  If you develope a fever above 101 F, pus  (white drainage) or increased drainage or redness at the wound, or calf pain, call your surgeon's office.     Constipation Prevention    Complete by:  As directed   Drink plenty of fluids.  Prune juice may be helpful.  You may use a stool softener, such as Colace (over  the counter) 100 mg twice a day.  Use MiraLax (over the counter) for constipation as needed.     Diet - low sodium heart healthy    Complete by:  As directed      Driving restrictions    Complete by:  As directed   No driving for 4 weeks     Increase activity slowly as tolerated    Complete by:  As directed      Lifting restrictions    Complete by:  As directed   No lifting for 6 weeks     TED hose    Complete by:  As directed   Use stockings (TED hose) for 2 weeks on both leg(s).  You may remove them at night for sleeping.           Follow-up Information    Follow up with Rosio Weiss, Horald Pollen, MD. Schedule an appointment as soon as possible for a visit in 2 weeks.   Specialty:  Orthopedic Surgery   Why:  For wound re-check   Contact information:   [address] [address] [phone]       Follow up with [facility].   Why:  Someone from [facility] will contact you concerning start date and time for therapy.   Contact information:   [place] [address] [phone]        Signed: Elie Goody 05/17/2015, 3:29 PM

## 2015-05-16 NOTE — Evaluation (Signed)
Occupational Therapy Evaluation Patient Details Name: Chelsea Mcintyre MRN: [id number] DOB: 11-15-1932 Today's Date: 05/16/2015    History of Present Illness Pt is an 81 y.o. female s/p Left total hip arthroplasty, anterior approach. PMH: MVP, hypertension, Dysrhythmia, recurrent upper respiratiory infection, Porphyria variegata, cancer, frequency of urination, arithrits, pneumonia.    Clinical Impression   Pt reports she was independent with ADLs PTA. Currently pt is overall min guard for ADLs and functional mobility with the exception of min assist for LB ADLs. Began ADL and safety education with pt; she verbalized understanding. Pt is planning to d/c home with 24/7 supervision from family. Pt reports that her daughter, who was planning to stay with her initially upon d/c is sick; pt is working on arranging other help for first few days. Pt would benefit from continued skilled OT in order to maximize independence and safety with LB ADLs, toilet and walk in shower transfers.    Follow Up Recommendations  No OT follow up;Supervision/Assistance - 24 hour    Equipment Recommendations  None recommended by OT    Recommendations for Other Services       Precautions / Restrictions Precautions Precautions: Fall Restrictions Weight Bearing Restrictions: Yes LLE Weight Bearing: Weight bearing as tolerated      Mobility Bed Mobility               General bed mobility comments: Pt OOB in chair  Transfers Overall transfer level: Needs assistance Equipment used: Rolling walker (2 wheeled) Transfers: Sit to/from Stand Sit to Stand: Min guard         General transfer comment: Min guard for safety, no physical assist needed. Good hand placement and technique. Sit to stand from chair x 1, toilet x 1    Balance Overall balance assessment: Needs assistance         Standing balance support: During functional activity;No upper extremity supported Standing balance-Leahy Scale:  Fair Standing balance comment: Able to stand at sink and wash hands without UE support                            ADL Overall ADL's : Needs assistance/impaired Eating/Feeding: Set up;Sitting   Grooming: Min guard;Wash/dry hands;Standing       Lower Body Bathing: Minimal assistance;Sit to/from stand       Lower Body Dressing: Minimal assistance;Sit to/from stand Lower Body Dressing Details (indicate cue type and reason): Pt able to doff bilateral socks, difficulty donning L sock. Pt reports someone will be available to assist as needed. Educated on compensatory strategies for LB ADLs. Toilet Transfer: Min guard;Ambulation;Comfort height toilet;RW;Grab bars Toilet Transfer Details (indicate cue type and reason): Educated on use of 3 in 1 over toilet or use of toilet riser Toileting- Clothing Manipulation and Hygiene: Min guard;Sit to/from stand Toileting - Clothing Manipulation Details (indicate cue type and reason): for toilet hygiene and clothing manipulation     Functional mobility during ADLs: Min guard;Rolling walker General ADL Comments: No family present for OT eval. Educated on home safety, need for supervision during ADLs and mobility, ice for edema and pain; pt verbalized understanding.     Vision     Perception     Praxis      Pertinent Vitals/Pain Pain Assessment: 0-10 Pain Score: 3  Pain Location: L hip Pain Descriptors / Indicators: Sore;Tightness Pain Intervention(s): Monitored during session;Ice applied     Hand Dominance     Extremity/Trunk Assessment Upper Extremity  Assessment Upper Extremity Assessment: Overall WFL for tasks assessed   Lower Extremity Assessment Lower Extremity Assessment: Defer to PT evaluation   Cervical / Trunk Assessment Cervical / Trunk Assessment: Normal   Communication Communication Communication: No difficulties   Cognition Arousal/Alertness: Awake/alert Behavior During Therapy: WFL for tasks  assessed/performed Overall Cognitive Status: Within Functional Limits for tasks assessed                     General Comments       Exercises       Shoulder Instructions      Home Living Family/patient expects to be discharged to:: Private residence Living Arrangements: Alone Available Help at Discharge: Family;Available 24 hours/day (sister, daughter, daughter in law coming to stay ) Type of Home: House Home Access: Stairs to enter     Home Layout: Two level;Able to live on main level with bedroom/bathroom     Bathroom Shower/Tub: Occupational psychologist: Handicapped height Bathroom Accessibility: Yes How Accessible: Accessible via walker Home Equipment: Bedside commode;Shower seat;Toilet riser          Prior Functioning/Environment Level of Independence: Independent             OT Diagnosis: Acute pain   OT Problem List: Impaired balance (sitting and/or standing);Decreased knowledge of use of DME or AE;Pain   OT Treatment/Interventions: Self-care/ADL training;DME and/or AE instruction;Patient/family education    OT Goals(Current goals can be found in the care plan section) Acute Rehab OT Goals Patient Stated Goal: to go home tomorrow OT Goal Formulation: With patient Time For Goal Achievement: 05/30/15 Potential to Achieve Goals: Good ADL Goals Pt Will Perform Lower Body Bathing: with supervision;sit to/from stand Pt Will Perform Lower Body Dressing: with supervision;sit to/from stand Pt Will Transfer to Toilet: with supervision;ambulating;bedside commode (over toilet) Pt Will Perform Toileting - Clothing Manipulation and hygiene: with supervision;sit to/from stand Pt Will Perform Tub/Shower Transfer: Shower transfer;with supervision;ambulating;shower seat;rolling walker  OT Frequency: Min 2X/week   Barriers to D/C:    Pts daughter was planning to stay with pt over next week but is sick, pt is arranging for other help upon d/c.        Co-evaluation              End of Session Equipment Utilized During Treatment: Gait belt;Rolling walker  Activity Tolerance: Patient tolerated treatment well Patient left: in chair;with call bell/phone within reach;with family/visitor present   Time: 1050-1109 OT Time Calculation (min): 19 min Charges:  OT General Charges $OT Visit: 1 Procedure OT Evaluation $Initial OT Evaluation Tier I: 1 Procedure G-Codes:     Binnie Kand M.S., OTR/L Pager: [phone]  05/16/2015, 11:21 AM

## 2015-05-16 NOTE — Progress Notes (Signed)
Occupational Therapy Treatment Patient Details Name: JAMARRA DAUTEL MRN: [id number] DOB: [birth year] Today's Date: 05/16/2015    History of present illness Pt is an 81 y.o. female s/p Left total hip arthroplasty, anterior approach. PMH: MVP, hypertension, Dysrhythmia, recurrent upper respiratiory infection, Porphyria variegata, cancer, frequency of urination, arithrits, pneumonia.    OT comments  Pt making good progress toward OT goals. Educated pt on walk in shower transfer technique and provided with handout; pt able to return demo technique with min guard assist for safety. D/c plan remains appropriate at this time. Will continue to follow acutely.   Follow Up Recommendations  No OT follow up;Supervision/Assistance - 24 hour    Equipment Recommendations  None recommended by OT    Recommendations for Other Services      Precautions / Restrictions Precautions Precautions: Fall Restrictions Weight Bearing Restrictions: Yes LLE Weight Bearing: Weight bearing as tolerated       Mobility Bed Mobility Overal bed mobility: Needs Assistance Bed Mobility: Sit to Supine       Sit to supine: Min assist   General bed mobility comments: Min assist to manage LLE into bed. Pt able to scoot up in bed with supervision.  Transfers Overall transfer level: Needs assistance Equipment used: Rolling walker (2 wheeled) Transfers: Sit to/from Stand Sit to Stand: Min guard              Balance Overall balance assessment: Needs assistance         Standing balance support: Bilateral upper extremity supported Standing balance-Leahy Scale: Fair Standing balance comment: RW for support                   ADL Overall ADL's : Needs assistance/impaired                                 Tub/ Shower Transfer: Min guard;Ambulation;Shower Technical sales engineer Details (indicate cue type and reason): Educated on walk in shower transfer technique and  provided with handout; pt able to return demo technique with min guard assist. Functional mobility during ADLs: Min guard;Rolling walker General ADL Comments: Pts son present for OT session. Reviewed home safety, supervision for ADLs and mobility, ice for edema and pain; pt verbalized understanding.      Vision                     Perception     Praxis      Cognition   Behavior During Therapy: WFL for tasks assessed/performed Overall Cognitive Status: Within Functional Limits for tasks assessed                       Extremity/Trunk Assessment               Exercises     Shoulder Instructions       General Comments      Pertinent Vitals/ Pain       Pain Assessment: 0-10 Pain Score:  ("intense soreness") Pain Location: L hip Pain Descriptors / Indicators: Sore Pain Intervention(s): Limited activity within patient's tolerance;Monitored during session;Repositioned;Ice applied  Home Living                                          Prior Functioning/Environment  Frequency Min 2X/week     Progress Toward Goals  OT Goals(current goals can now be found in the care plan section)  Progress towards OT goals: Progressing toward goals  Acute Rehab OT Goals Patient Stated Goal: to go home tomorrow OT Goal Formulation: With patient/family  Plan Discharge plan remains appropriate    Co-evaluation                 End of Session Equipment Utilized During Treatment: Rolling walker   Activity Tolerance Patient tolerated treatment well   Patient Left in bed;with call bell/phone within reach;with family/visitor present   Nurse Communication          Time: [id number] OT Time Calculation (min): 21 min  Charges: OT General Charges $OT Visit: 1 Procedure OT Treatments $Self Care/Home Management : 8-22 mins  Binnie Kand M.S., OTR/L Pager: [phone]  05/16/2015, 4:30 PM

## 2015-05-16 NOTE — Evaluation (Signed)
Physical Therapy Evaluation Patient Details Name: Chelsea Mcintyre MRN: [id number] DOB: 1933/02/02 Today's Date: 05/16/2015   History of Present Illness  Pt is an 81 y.o. female s/p Left total hip arthroplasty, anterior approach. PMH: MVP, hypertension, Dysrhythmia, recurrent upper respiratiory infection, Porphyria variegata, cancer, frequency of urination, arithrits, pneumonia.   Clinical Impression  Pt s/p L THR presents with decreased L LE strength/ROM and post op pain limiting functional mobility.  Pt should progress well to dc home with family assist and HHPT follow up.    Follow Up Recommendations Home health PT    Equipment Recommendations  Rolling walker with 5" wheels    Recommendations for Other Services OT consult     Precautions / Restrictions Precautions Precautions: Fall Restrictions Weight Bearing Restrictions: No LLE Weight Bearing: Weight bearing as tolerated      Mobility  Bed Mobility               General bed mobility comments: Pt OOB with RN and declines back to bed  Transfers Overall transfer level: Needs assistance Equipment used: Rolling walker (2 wheeled) Transfers: Sit to/from Stand Sit to Stand: Min guard         General transfer comment: cues for LE managment and use of UEs to self assist  Ambulation/Gait Ambulation/Gait assistance: Min assist;Min guard Ambulation Distance (Feet): 120 Feet Assistive device: Rolling walker (2 wheeled) Gait Pattern/deviations: Step-to pattern;Step-through pattern;Decreased step length - right;Decreased step length - left;Shuffle;Trunk flexed Gait velocity: decr   General Gait Details: cues for posture, position from RW and intial sequence  Stairs            Wheelchair Mobility    Modified Rankin (Stroke Patients Only)       Balance Overall balance assessment: Needs assistance         Standing balance support: During functional activity;No upper extremity supported Standing  balance-Leahy Scale: Fair Standing balance comment: Able to stand at sink and wash hands without UE support                             Pertinent Vitals/Pain Pain Assessment: 0-10 Pain Score: 3  Pain Location: L hip Pain Descriptors / Indicators: Aching;Sore Pain Intervention(s): Limited activity within patient's tolerance;Monitored during session;Premedicated before session;Ice applied    Home Living Family/patient expects to be discharged to:: Private residence Living Arrangements: Alone Available Help at Discharge: Family;Available 24 hours/day Type of Home: House Home Access: Stairs to enter Entrance Stairs-Rails: Right;Left;Can reach both Entrance Stairs-Number of Steps: 5 Home Layout: Two level;Able to live on main level with bedroom/bathroom Home Equipment: Bedside commode;Shower seat;Toilet riser      Prior Function Level of Independence: Independent               Hand Dominance        Extremity/Trunk Assessment   Upper Extremity Assessment: Overall WFL for tasks assessed           Lower Extremity Assessment: LLE deficits/detail   LLE Deficits / Details: Strength at hip 2+/5 with AAROM at hip to 80 flex and 15 abd  Cervical / Trunk Assessment: Normal  Communication   Communication: No difficulties  Cognition Arousal/Alertness: Awake/alert Behavior During Therapy: WFL for tasks assessed/performed Overall Cognitive Status: Within Functional Limits for tasks assessed                      General Comments      Exercises  Total Joint Exercises Ankle Circles/Pumps: AROM;Both;15 reps;Supine Quad Sets: AROM;Both;10 reps;Supine Heel Slides: AAROM;Left;20 reps;Supine Hip ABduction/ADduction: AAROM;Left;15 reps;Supine      Assessment/Plan    PT Assessment Patient needs continued PT services  PT Diagnosis Difficulty walking   PT Problem List Decreased strength;Decreased range of motion;Decreased activity tolerance;Decreased  mobility;Decreased knowledge of use of DME;Pain  PT Treatment Interventions DME instruction;Gait training;Stair training;Functional mobility training;Therapeutic activities;Therapeutic exercise;Patient/family education   PT Goals (Current goals can be found in the Care Plan section) Acute Rehab PT Goals Patient Stated Goal: to go home tomorrow PT Goal Formulation: With patient Time For Goal Achievement: 05/18/15 Potential to Achieve Goals: Good    Frequency 7X/week   Barriers to discharge        Co-evaluation               End of Session Equipment Utilized During Treatment: Gait belt Activity Tolerance: Patient tolerated treatment well Patient left: in chair;with call bell/phone within reach Nurse Communication: Mobility status         Time: [id number] PT Time Calculation (min) (ACUTE ONLY): 29 min   Charges:   PT Evaluation $Initial PT Evaluation Tier I: 1 Procedure PT Treatments $Therapeutic Exercise: 8-22 mins   PT G Codes:        Haila Dena 2015/06/13, 12:47 PM

## 2015-05-16 NOTE — Care Management Note (Signed)
Case Management Note  Patient Details  Name: Chelsea Mcintyre MRN: [id number] Date of Birth: 08-16-1932  Subjective/Objective:    Very pleasant 81 yr old female s/p left total hip arthroplasty.                Action/Plan: This case manager spoke with patient and her son concerning home health and DME needs at discharge. Patient was preoperatively setup with [facility], no changes. Patient states she has borrowed a 3in1 but will need a rolling walker, case manager has requested. Patient has a very supportive family and they have arranged a schedule to provide care during her recovery.   Expected Discharge Date:    05/17/15              Expected Discharge Plan:   Home with Home Health  In-House Referral:  NA  Discharge planning Services  CM Consult  Post Acute Care Choice:  Durable Medical Equipment, Home Health Choice offered to:  Patient, Adult Children  DME Arranged:  Walker rolling DME Agency:  [place] Arranged:  PT [place]:  [place]  Status of Service:  Completed, signed off  Medicare Important Message Given:    Date Medicare IM Given:    Medicare IM give by:    Date Additional Medicare IM Given:    Additional Medicare Important Message give by:     If discussed at [place] of Stay Meetings, dates discussed:    Additional Comments:  Ninfa Meeker, RN 05/16/2015, 1:52 PM

## 2015-05-16 NOTE — Progress Notes (Signed)
Physical Therapy Treatment Patient Details Name: Chelsea Mcintyre MRN: [id number] DOB: Jan 22, 1933 Today's Date: 05/16/2015    History of Present Illness Pt is an 81 y.o. female s/p Left total hip arthroplasty, anterior approach. PMH: MVP, hypertension, Dysrhythmia, recurrent upper respiratiory infection, Porphyria variegata, cancer, frequency of urination, arithrits, pneumonia.     PT Comments    Pt motivated and progressing well with mobility.  Follow Up Recommendations  Home health PT     Equipment Recommendations  Rolling walker with 5" wheels    Recommendations for Other Services OT consult     Precautions / Restrictions Precautions Precautions: Fall Restrictions Weight Bearing Restrictions: Yes LLE Weight Bearing: Weight bearing as tolerated    Mobility  Bed Mobility Overal bed mobility: Needs Assistance Bed Mobility: Sit to Supine     Supine to sit: Min assist Sit to supine: Min assist   General bed mobility comments: Min assist to manage LLE into bed. Pt able to scoot up in bed with supervision.  Transfers Overall transfer level: Needs assistance Equipment used: Rolling walker (2 wheeled) Transfers: Sit to/from Stand Sit to Stand: Min guard         General transfer comment: cues for LE managment and use of UEs to self assist  Ambulation/Gait Ambulation/Gait assistance: Min guard;Supervision Ambulation Distance (Feet): 175 Feet Assistive device: Rolling walker (2 wheeled) Gait Pattern/deviations: Step-to pattern;Step-through pattern;Decreased step length - right;Decreased step length - left;Shuffle;Trunk flexed Gait velocity: decr   General Gait Details: cues for posture, position from RW and intial sequence   Stairs Stairs: Yes Stairs assistance: Min assist Stair Management: One rail Left;Two rails;Step to pattern;Forwards;With cane Number of Stairs: 12 General stair comments: 5 steps with bil rails.  7 steps with rail and crutch.  Cues for  sequence and foot/cane placement  Wheelchair Mobility    Modified Rankin (Stroke Patients Only)       Balance Overall balance assessment: Needs assistance         Standing balance support: Bilateral upper extremity supported Standing balance-Leahy Scale: Fair Standing balance comment: RW for support                    Cognition Arousal/Alertness: Awake/alert Behavior During Therapy: WFL for tasks assessed/performed Overall Cognitive Status: Within Functional Limits for tasks assessed                      Exercises      General Comments        Pertinent Vitals/Pain Pain Assessment: 0-10 Pain Score:  ("intense soreness") Pain Location: L hip Pain Descriptors / Indicators: Sore Pain Intervention(s): Limited activity within patient's tolerance;Monitored during session;Repositioned;Ice applied    Home Living                      Prior Function            PT Goals (current goals can now be found in the care plan section) Acute Rehab PT Goals Patient Stated Goal: to go home tomorrow PT Goal Formulation: With patient Time For Goal Achievement: 05/18/15 Potential to Achieve Goals: Good Progress towards PT goals: Progressing toward goals    Frequency  7X/week    PT Plan Current plan remains appropriate    Co-evaluation             End of Session Equipment Utilized During Treatment: Gait belt Activity Tolerance: Patient tolerated treatment well Patient left: Other (comment) (standing with OT)  Time: [id number] PT Time Calculation (min) (ACUTE ONLY): 25 min  Charges:  $Gait Training: 23-37 mins                    G Codes:      Chelsea Mcintyre 2015/05/31, 5:29 PM

## 2015-05-17 LAB — CBC
HCT: 31.9 % — ABNORMAL LOW (ref 36.0–46.0)
HEMOGLOBIN: 10.5 g/dL — AB (ref 12.0–15.0)
MCH: 30.4 pg (ref 26.0–34.0)
MCHC: 32.9 g/dL (ref 30.0–36.0)
MCV: 92.5 fL (ref 78.0–100.0)
PLATELETS: 145 10*3/uL — AB (ref 150–400)
RBC: 3.45 MIL/uL — AB (ref 3.87–5.11)
RDW: 12.7 % (ref 11.5–15.5)
WBC: 11.5 10*3/uL — AB (ref 4.0–10.5)

## 2015-05-17 NOTE — Progress Notes (Signed)
Physical Therapy Treatment Patient Details Name: Chelsea Mcintyre MRN: [id number] DOB: 1932-10-15 Today's Date: 05/17/2015    History of Present Illness Pt is an 81 y.o. female s/p Left total hip arthroplasty, anterior approach. PMH: MVP, hypertension, Dysrhythmia, recurrent upper respiratiory infection, Porphyria variegata, cancer, frequency of urination, arithrits, pneumonia.     PT Comments    Pt progressing well with mobility and eager for return home.  Reviewed therex, stairs and car transfers  Follow Up Recommendations  Home health PT     Equipment Recommendations  Rolling walker with 5" wheels    Recommendations for Other Services OT consult     Precautions / Restrictions Precautions Precautions: Fall Restrictions Weight Bearing Restrictions: Yes LLE Weight Bearing: Weight bearing as tolerated    Mobility  Bed Mobility Overal bed mobility: Needs Assistance Bed Mobility: Supine to Sit;Sit to Supine     Supine to sit: Min guard Sit to supine: Min assist   General bed mobility comments: Min assist to manage LLE into bed. Min cues for technique and sequence  Transfers Overall transfer level: Needs assistance Equipment used: Rolling walker (2 wheeled) Transfers: Sit to/from Stand Sit to Stand: Supervision         General transfer comment: min cues for LE managment and use of UEs to self assist  Ambulation/Gait Ambulation/Gait assistance: Supervision;Min guard Ambulation Distance (Feet): 400 Feet Assistive device: Rolling walker (2 wheeled) Gait Pattern/deviations: Step-to pattern;Step-through pattern;Decreased step length - right;Decreased step length - left;Shuffle;Trunk flexed     General Gait Details: min cues for posture, position from RW and intial sequence   Stairs Stairs: Yes Stairs assistance: Min assist Stair Management: One rail Left;Step to pattern;Forwards;With cane Number of Stairs: 5 General stair comments: cues for sequence and  foot/cane placement  Wheelchair Mobility    Modified Rankin (Stroke Patients Only)       Balance             Standing balance-Leahy Scale: Fair                      Cognition Arousal/Alertness: Awake/alert Behavior During Therapy: WFL for tasks assessed/performed Overall Cognitive Status: Within Functional Limits for tasks assessed                      Exercises Total Joint Exercises Ankle Circles/Pumps: AROM;Both;15 reps;Supine Quad Sets: AROM;Both;10 reps;Supine Gluteal Sets: AROM;Both;10 reps;Supine Heel Slides: AAROM;Left;20 reps;Supine Hip ABduction/ADduction: AAROM;Left;15 reps;Supine Long Arc Quad: AAROM;Left;Seated;15 reps    General Comments        Pertinent Vitals/Pain Pain Assessment: 0-10 Pain Score: 3  Pain Location: L hip Pain Descriptors / Indicators: Sore Pain Intervention(s): Monitored during session;Limited activity within patient's tolerance (pt declines pre-med)    Home Living                      Prior Function            PT Goals (current goals can now be found in the care plan section) Acute Rehab PT Goals Patient Stated Goal: Resume previous lifestyle with decreased pain PT Goal Formulation: With patient Time For Goal Achievement: 05/18/15 Potential to Achieve Goals: Good Progress towards PT goals: Progressing toward goals    Frequency  7X/week    PT Plan Current plan remains appropriate    Co-evaluation             End of Session Equipment Utilized During Treatment: Gait belt Activity Tolerance: Patient  tolerated treatment well Patient left: in chair;with call bell/phone within reach     Time: 0818-0856 PT Time Calculation (min) (ACUTE ONLY): 38 min  Charges:  $Gait Training: 8-22 mins $Therapeutic Exercise: 8-22 mins                    G Codes:      Chelsea Mcintyre 2015-05-30, 12:14 PM

## 2015-05-17 NOTE — Progress Notes (Signed)
   Subjective:  Patient reports pain as mild to moderate.  No c/o. Denies N/V/CP/SOB.  Objective:   VITALS:   Filed Vitals:   05/16/15 1958 05/16/15 2001 05/16/15 2210 05/17/15 0444  BP: 119/46 106/56 123/49 109/52  Pulse: 72  71 82  Temp: 98.1 F (36.7 C)   98.2 F (36.8 C)  TempSrc: Oral   Oral  Resp: 18   16  Weight:      SpO2: 97%   95%    ABD soft Sensation intact distally Intact pulses distally Dorsiflexion/Plantar flexion intact Incision: dressing C/D/I Compartment soft   Lab Results  Component Value Date   WBC 11.5* 05/17/2015   HGB 10.5* 05/17/2015   HCT 31.9* 05/17/2015   MCV 92.5 05/17/2015   PLT 145* 05/17/2015   BMET    Component Value Date/Time   NA 140 05/16/2015 0704   K 4.0 05/16/2015 0704   CL 107 05/16/2015 0704   CO2 25 05/16/2015 0704   GLUCOSE 133* 05/16/2015 0704   BUN 18 05/16/2015 0704   CREATININE 0.81 05/16/2015 0704   CALCIUM 8.4* 05/16/2015 0704   GFRNONAA >60 05/16/2015 0704   GFRAA >60 05/16/2015 0704     Assessment/Plan: 2 Days Post-Op   Principal Problem:   Primary osteoarthritis of left hip   WBAT with walker DVT ppx: ASA, SCDs, TEDs PO pain control PT/OT Dispo: d/c home today with HHPT   Daelyn Pettaway, Horald Pollen 05/17/2015, 7:56 AM   Rod Can, MD Cell [phone]

## 2015-09-14 DIAGNOSIS — Z79899 Other long term (current) drug therapy: Secondary | ICD-10-CM

## 2015-09-14 DIAGNOSIS — I10 Essential (primary) hypertension: Secondary | ICD-10-CM

## 2015-09-14 DIAGNOSIS — M51369 Other intervertebral disc degeneration, lumbar region without mention of lumbar back pain or lower extremity pain: Secondary | ICD-10-CM | POA: Insufficient documentation

## 2015-09-14 DIAGNOSIS — N6019 Diffuse cystic mastopathy of unspecified breast: Secondary | ICD-10-CM

## 2015-09-14 DIAGNOSIS — R5381 Other malaise: Secondary | ICD-10-CM | POA: Insufficient documentation

## 2015-09-14 DIAGNOSIS — K589 Irritable bowel syndrome without diarrhea: Secondary | ICD-10-CM

## 2015-09-14 DIAGNOSIS — E782 Mixed hyperlipidemia: Secondary | ICD-10-CM

## 2015-09-14 HISTORY — DX: Irritable bowel syndrome, unspecified: K58.9

## 2015-09-14 HISTORY — DX: Other intervertebral disc degeneration, lumbar region without mention of lumbar back pain or lower extremity pain: M51.369

## 2015-09-14 HISTORY — DX: Diffuse cystic mastopathy of unspecified breast: N60.19

## 2015-09-14 HISTORY — DX: Other malaise: R53.81

## 2015-09-14 HISTORY — DX: Other long term (current) drug therapy: Z79.899

## 2015-09-14 HISTORY — DX: Mixed hyperlipidemia: E78.2

## 2015-09-14 HISTORY — DX: Essential (primary) hypertension: I10

## 2016-10-20 ENCOUNTER — Observation Stay: Payer: Medicare Other

## 2016-10-20 ENCOUNTER — Other Ambulatory Visit: Payer: Self-pay

## 2016-10-20 ENCOUNTER — Emergency Department: Payer: Medicare Other

## 2016-10-20 ENCOUNTER — Observation Stay
Admission: EM | Admit: 2016-10-20 | Discharge: 2016-10-21 | Disposition: A | Discharge status: Home / Self Care | Payer: Medicare Other | Attending: Internal Medicine | Admitting: Internal Medicine

## 2016-10-20 DIAGNOSIS — Z720 Tobacco use: Secondary | ICD-10-CM | POA: Insufficient documentation

## 2016-10-20 DIAGNOSIS — Z91040 Latex allergy status: Secondary | ICD-10-CM | POA: Diagnosis not present

## 2016-10-20 DIAGNOSIS — G459 Transient cerebral ischemic attack, unspecified: Secondary | ICD-10-CM

## 2016-10-20 DIAGNOSIS — Z96642 Presence of left artificial hip joint: Secondary | ICD-10-CM | POA: Insufficient documentation

## 2016-10-20 DIAGNOSIS — M4856XA Collapsed vertebra, not elsewhere classified, lumbar region, initial encounter for fracture: Secondary | ICD-10-CM | POA: Diagnosis not present

## 2016-10-20 DIAGNOSIS — I739 Peripheral vascular disease, unspecified: Secondary | ICD-10-CM | POA: Insufficient documentation

## 2016-10-20 DIAGNOSIS — Z90710 Acquired absence of both cervix and uterus: Secondary | ICD-10-CM | POA: Insufficient documentation

## 2016-10-20 DIAGNOSIS — I499 Cardiac arrhythmia, unspecified: Secondary | ICD-10-CM | POA: Insufficient documentation

## 2016-10-20 DIAGNOSIS — R350 Frequency of micturition: Secondary | ICD-10-CM | POA: Diagnosis not present

## 2016-10-20 DIAGNOSIS — Z888 Allergy status to other drugs, medicaments and biological substances status: Secondary | ICD-10-CM | POA: Insufficient documentation

## 2016-10-20 DIAGNOSIS — I639 Cerebral infarction, unspecified: Secondary | ICD-10-CM

## 2016-10-20 DIAGNOSIS — R4701 Aphasia: Secondary | ICD-10-CM | POA: Diagnosis not present

## 2016-10-20 DIAGNOSIS — Z91048 Other nonmedicinal substance allergy status: Secondary | ICD-10-CM | POA: Diagnosis not present

## 2016-10-20 DIAGNOSIS — Z8489 Family history of other specified conditions: Secondary | ICD-10-CM | POA: Diagnosis not present

## 2016-10-20 DIAGNOSIS — I341 Nonrheumatic mitral (valve) prolapse: Secondary | ICD-10-CM | POA: Insufficient documentation

## 2016-10-20 DIAGNOSIS — M19011 Primary osteoarthritis, right shoulder: Secondary | ICD-10-CM | POA: Diagnosis not present

## 2016-10-20 DIAGNOSIS — M8580 Other specified disorders of bone density and structure, unspecified site: Secondary | ICD-10-CM | POA: Diagnosis not present

## 2016-10-20 DIAGNOSIS — Z7902 Long term (current) use of antithrombotics/antiplatelets: Secondary | ICD-10-CM | POA: Insufficient documentation

## 2016-10-20 DIAGNOSIS — E785 Hyperlipidemia, unspecified: Secondary | ICD-10-CM | POA: Insufficient documentation

## 2016-10-20 DIAGNOSIS — M160 Bilateral primary osteoarthritis of hip: Secondary | ICD-10-CM | POA: Diagnosis not present

## 2016-10-20 DIAGNOSIS — Z7982 Long term (current) use of aspirin: Secondary | ICD-10-CM | POA: Diagnosis not present

## 2016-10-20 DIAGNOSIS — Z8673 Personal history of transient ischemic attack (TIA), and cerebral infarction without residual deficits: Secondary | ICD-10-CM | POA: Insufficient documentation

## 2016-10-20 DIAGNOSIS — M19012 Primary osteoarthritis, left shoulder: Secondary | ICD-10-CM | POA: Diagnosis not present

## 2016-10-20 DIAGNOSIS — I371 Nonrheumatic pulmonary valve insufficiency: Secondary | ICD-10-CM | POA: Insufficient documentation

## 2016-10-20 DIAGNOSIS — Z7901 Long term (current) use of anticoagulants: Secondary | ICD-10-CM | POA: Insufficient documentation

## 2016-10-20 DIAGNOSIS — I10 Essential (primary) hypertension: Secondary | ICD-10-CM | POA: Insufficient documentation

## 2016-10-20 DIAGNOSIS — Z882 Allergy status to sulfonamides status: Secondary | ICD-10-CM | POA: Diagnosis not present

## 2016-10-20 HISTORY — DX: Transient cerebral ischemic attack, unspecified: G45.9

## 2016-10-20 LAB — URINALYSIS, COMPLETE (UACMP) WITH MICROSCOPIC
BILIRUBIN URINE: NEGATIVE
GLUCOSE, UA: NEGATIVE mg/dL
HGB URINE DIPSTICK: NEGATIVE
KETONES UR: NEGATIVE mg/dL
Leukocytes, UA: NEGATIVE
Nitrite: NEGATIVE
PROTEIN: NEGATIVE mg/dL
Specific Gravity, Urine: 1.005 (ref 1.005–1.030)
Squamous Epithelial / LPF: NONE SEEN
pH: 7 (ref 5.0–8.0)

## 2016-10-20 LAB — COMPREHENSIVE METABOLIC PANEL
ALBUMIN: 4.1 g/dL (ref 3.5–5.0)
ALT: 17 U/L (ref 14–54)
AST: 28 U/L (ref 15–41)
Alkaline Phosphatase: 82 U/L (ref 38–126)
Anion gap: 8 (ref 5–15)
BUN: 17 mg/dL (ref 6–20)
CHLORIDE: 104 mmol/L (ref 101–111)
CO2: 28 mmol/L (ref 22–32)
Calcium: 8.9 mg/dL (ref 8.9–10.3)
Creatinine, Ser: 0.64 mg/dL (ref 0.44–1.00)
GFR calc non Af Amer: >60 mL/min (ref >60)
Glucose, Bld: 102 mg/dL — ABNORMAL HIGH (ref 65–99)
Potassium: 3.8 mmol/L (ref 3.5–5.1)
SODIUM: 140 mmol/L (ref 135–145)
Total Bilirubin: 0.9 mg/dL (ref 0.3–1.2)
Total Protein: 6.5 g/dL (ref 6.5–8.1)

## 2016-10-20 LAB — APTT: aPTT: 29 seconds (ref 24–36)

## 2016-10-20 LAB — DIFFERENTIAL
BASOS PCT: 0 %
Basophils Absolute: 0 10*3/uL (ref 0–0.1)
EOS PCT: 1 %
Eosinophils Absolute: 0.1 10*3/uL (ref 0–0.7)
Lymphocytes Relative: 20 %
Lymphs Abs: 1.4 10*3/uL (ref 1.0–3.6)
MONO ABS: 0.6 10*3/uL (ref 0.2–0.9)
Monocytes Relative: 9 %
NEUTROS ABS: 4.9 10*3/uL (ref 1.4–6.5)
NEUTROS PCT: 70 %

## 2016-10-20 LAB — PROTIME-INR
INR: 1.08
PROTHROMBIN TIME: 14 s (ref 11.4–15.2)

## 2016-10-20 LAB — CBC
HCT: 40.2 % (ref 35.0–47.0)
Hemoglobin: 14 g/dL (ref 12.0–16.0)
MCH: 31.9 pg (ref 26.0–34.0)
MCHC: 34.9 g/dL (ref 32.0–36.0)
MCV: 91.5 fL (ref 80.0–100.0)
PLATELETS: 174 10*3/uL (ref 150–440)
RBC: 4.39 MIL/uL (ref 3.80–5.20)
RDW: 12.7 % (ref 11.5–14.5)
WBC: 7.1 10*3/uL (ref 3.6–11.0)

## 2016-10-20 LAB — GLUCOSE, CAPILLARY: GLUCOSE-CAPILLARY: 86 mg/dL (ref 65–99)

## 2016-10-20 LAB — LIPID PANEL
CHOLESTEROL: 200 mg/dL (ref 0–200)
HDL: 50 mg/dL (ref >40)
LDL Cholesterol: 131 mg/dL — ABNORMAL HIGH (ref 0–99)
Total CHOL/HDL Ratio: 4 RATIO
Triglycerides: 94 mg/dL (ref <150)
VLDL: 19 mg/dL (ref 0–40)

## 2016-10-20 LAB — TROPONIN I: Troponin I: <0.03 ng/mL (ref <0.03)

## 2016-10-20 MED ORDER — CALCIUM CARBONATE ANTACID 500 MG PO CHEW
500.0000 mg | CHEWABLE_TABLET | Freq: Every day | ORAL | Status: DC
  Administered 2016-10-20 – 2016-10-21 (×2): 500 mg via ORAL
  Filled 2016-10-20: qty 1
  Filled 2016-10-20: qty 2
  Filled 2016-10-20 (×2): qty 1

## 2016-10-20 MED ORDER — SODIUM CHLORIDE 0.9 % IV SOLN
INTRAVENOUS | Status: DC
  Administered 2016-10-20 – 2016-10-21 (×2): via INTRAVENOUS

## 2016-10-20 MED ORDER — DIAZEPAM 5 MG PO TABS: 2.5000 mg | ORAL_TABLET | Freq: Every day | ORAL | Status: DC

## 2016-10-20 MED ORDER — ASPIRIN EC 325 MG PO TBEC
325.0000 mg | DELAYED_RELEASE_TABLET | Freq: Every day | ORAL | Status: DC
Start: 2016-10-21 — End: 2016-10-21
  Administered 2016-10-21: 325 mg via ORAL
  Filled 2016-10-20: qty 1

## 2016-10-20 MED ORDER — POLYVINYL ALCOHOL 1.4 % OP SOLN
2.0000 [drp] | Freq: Three times a day (TID) | OPHTHALMIC | Status: DC | PRN
  Filled 2016-10-20: qty 15

## 2016-10-20 MED ORDER — ACETAMINOPHEN 325 MG PO TABS
650.0000 mg | ORAL_TABLET | Freq: Four times a day (QID) | ORAL | Status: DC | PRN
  Administered 2016-10-20: 650 mg via ORAL
  Filled 2016-10-20: qty 2

## 2016-10-20 MED ORDER — NADOLOL 40 MG PO TABS
20.0000 mg | ORAL_TABLET | Freq: Every day | ORAL | Status: DC
  Administered 2016-10-20 – 2016-10-21 (×2): 20 mg via ORAL
  Filled 2016-10-20 (×2): qty 1

## 2016-10-20 MED ORDER — ASPIRIN 81 MG PO CHEW
324.0000 mg | CHEWABLE_TABLET | Freq: Once | ORAL | Status: AC
  Administered 2016-10-20: 324 mg via ORAL
  Filled 2016-10-20: qty 4

## 2016-10-20 MED ORDER — HYDRALAZINE HCL 20 MG/ML IJ SOLN: 10.0000 mg | Freq: Four times a day (QID) | INTRAMUSCULAR | Status: DC | PRN

## 2016-10-20 MED ORDER — LOSARTAN POTASSIUM 50 MG PO TABS
25.0000 mg | ORAL_TABLET | Freq: Two times a day (BID) | ORAL | Status: DC
  Administered 2016-10-20 – 2016-10-21 (×2): 25 mg via ORAL
  Filled 2016-10-20 (×2): qty 1

## 2016-10-20 MED ORDER — ONDANSETRON HCL 4 MG/2ML IJ SOLN: 4.0000 mg | Freq: Four times a day (QID) | INTRAMUSCULAR | Status: DC | PRN

## 2016-10-20 MED ORDER — ADULT MULTIVITAMIN W/MINERALS CH
1.0000 | ORAL_TABLET | Freq: Every day | ORAL | Status: DC
  Administered 2016-10-20 – 2016-10-21 (×2): 1 via ORAL
  Filled 2016-10-20 (×2): qty 1

## 2016-10-20 MED ORDER — VITAMIN D 1000 UNITS PO TABS
2000.0000 [IU] | ORAL_TABLET | Freq: Every day | ORAL | Status: DC
  Administered 2016-10-21: 10:00:00 2000 [IU] via ORAL
  Filled 2016-10-20: qty 2

## 2016-10-20 MED ORDER — ATORVASTATIN CALCIUM 20 MG PO TABS
40.0000 mg | ORAL_TABLET | Freq: Every day | ORAL | Status: DC
  Administered 2016-10-20: 18:00:00 40 mg via ORAL
  Filled 2016-10-20: qty 2

## 2016-10-20 MED ORDER — ONDANSETRON HCL 4 MG PO TABS: 4.0000 mg | ORAL_TABLET | Freq: Four times a day (QID) | ORAL | Status: DC | PRN

## 2016-10-20 MED ORDER — CARBAMIDE PEROXIDE 6.5 % OT SOLN
5.0000 [drp] | Freq: Two times a day (BID) | OTIC | Status: DC
  Administered 2016-10-20 – 2016-10-21 (×2): 5 [drp] via OTIC
  Filled 2016-10-20: qty 15

## 2016-10-20 MED ORDER — ENOXAPARIN SODIUM 40 MG/0.4ML ~~LOC~~ SOLN
40.0000 mg | SUBCUTANEOUS | Status: DC
  Administered 2016-10-20: 21:00:00 40 mg via SUBCUTANEOUS
  Filled 2016-10-20: qty 0.4

## 2016-10-20 MED ORDER — SENNA 8.6 MG PO TABS
2.0000 | ORAL_TABLET | Freq: Every day | ORAL | Status: DC
  Filled 2016-10-20: qty 2

## 2016-10-20 MED ORDER — ACETAMINOPHEN 650 MG RE SUPP: 650.0000 mg | Freq: Four times a day (QID) | RECTAL | Status: DC | PRN

## 2016-10-20 MED ORDER — DOCUSATE SODIUM 100 MG PO CAPS
100.0000 mg | ORAL_CAPSULE | Freq: Two times a day (BID) | ORAL | Status: DC
  Filled 2016-10-20 (×2): qty 1

## 2016-10-20 NOTE — ED Notes (Signed)
This RN transferred pt to MRI, Barbaraann Rondo MRI technologist verbalized he would place the 5 lead back on pt when transferred to 1C rm 110.

## 2016-10-20 NOTE — H&P (Signed)
[facility] [place] NAME: Chelsea Mcintyre    MR#:  [id number]  DATE OF BIRTH:  11-11-1932  DATE OF ADMISSION:  10/20/2016  PRIMARY CARE PHYSICIAN: Raina Mina., MD   REQUESTING/REFERRING PHYSICIAN: Dr. Larae Grooms  CHIEF COMPLAINT:   Chief Complaint  Patient presents with  . Altered Mental Status  . Headache    HISTORY OF PRESENT ILLNESS:  Chelsea Mcintyre  is a 83 y.o. female with a known history of Hypertension, arthritis, mitral valve prolapse, Presents to the hospital secondary to sudden onset of right-sided headache and word finding difficulty this morning. Patient is very independent and lives by herself. But has had about 5 falls in the last 1 month. Most of them she attributes to dizziness and right ear fullness. The last couple of days she has noticed that her speech was different. She was having trouble finding the right words. This morning her daughter-in-law was helping her clean the house, patient had sudden worsening of right sided headache and also worsening of speech and trouble getting the words right. 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 She is being admitted for TIA workup. Her speech is much better at this time. Blood pressure was significantly elevated  PAST MEDICAL HISTORY:   Past Medical History:  Diagnosis Date  . Arthritis    bilateral hips and bilateral shoulders.  . Cancer ([place])    skin back, nose  . Complication of anesthesia    heart stopped with the anesthesia x1,  difficult to wake up  . Dysrhythmia    if taking certain medications  . Family history of adverse reaction to anesthesia    sister slow to awaken- "almost died last time"  . Frequency of urination   . Hypertension   . Jaundice    as a child  . MVP (mitral valve prolapse)   . Pneumonia 09   hx  . 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 Recurrent upper respiratory infection (URI)    Dx  1st of september with sinus infection, placed on antibiotics at that time    PAST SURGICAL HISTORY:   Past Surgical History:  Procedure Laterality Date  . ABDOMINAL HYSTERECTOMY  73   partial  . APPENDECTOMY    . BACK SURGERY  2012   kyphoplasty  . BREAST SURGERY     1982,70rt  . CARDIAC CATHETERIZATION     1994, detected MVP  . ELBOW FRACTURE SURGERY Left 98,   hardware removed later in year  . EYE SURGERY     bilateral cataract surgery 2007 with lens implant  . fibroid cyst     ovary right removed 1966  . fibroid tumor     1970, right breast removed  . FRACTURE SURGERY     1988 shattered elbow left  . HARDWARE REMOVAL     left elbow 1998  . TONSILLECTOMY  42   and adenoids  . TOTAL HIP ARTHROPLASTY Left 05/15/2015   Procedure: LEFT TOTAL HIP ARTHROPLASTY ANTERIOR APPROACH;  Surgeon: Rod Can, MD;  Location: [facility];  Service: Orthopedics;  Laterality: Left;    SOCIAL HISTORY:   Social History  Substance Use Topics  . Smoking status: Former Smoker    Packs/day: 0.50    Years: 2.00  . Smokeless tobacco: Not on file     Comment: off and on, non smoker presently  . Alcohol use  No    FAMILY HISTORY:   Family History  Problem Relation Age of Onset  . Anesthesia problems Father     DRUG ALLERGIES:   Allergies  Allergen Reactions  . Sulfa Drugs Cross Reactors Shortness Of Breath and Rash  . Gluten Meal Nausea Only    Bothers her stomach   . Tape Other (See Comments)    Layer of skin peels off and area becomes raw  . Latex Hives  . Lisinopril Cough  . Amlodipine Rash    REVIEW OF SYSTEMS:   Review of Systems  Constitutional: Negative for chills, fever, malaise/fatigue and weight loss.  HENT: Positive for ear pain. Negative for ear discharge, hearing loss, nosebleeds and tinnitus.        Right ear  Eyes: Negative for blurred vision, double vision and photophobia.  Respiratory: Negative for cough, hemoptysis, shortness of breath and wheezing.     Cardiovascular: Negative for chest pain, palpitations, orthopnea and leg swelling.  Gastrointestinal: Negative for abdominal pain, constipation, diarrhea, heartburn, melena, nausea and vomiting.  Genitourinary: Negative for dysuria, frequency, hematuria and urgency.  Musculoskeletal: Negative for back pain, myalgias and neck pain.  Skin: Negative for rash.  Neurological: Positive for speech change and headaches. Negative for dizziness, tingling, tremors, sensory change and focal weakness.  Endo/Heme/Allergies: Does not bruise/bleed easily.  Psychiatric/Behavioral: Negative for depression.    MEDICATIONS AT HOME:   Prior to Admission medications   Medication Sig Start Date End Date Taking? Authorizing Provider  aspirin EC 81 MG tablet Take 81 mg by mouth daily.   Yes [provider]  cholecalciferol (VITAMIN D) 1000 UNITS tablet Take 2,000 Units by mouth daily.     Yes [provider]  COD LIVER OIL PO Take 1 tablet by mouth daily.   Yes [provider]  losartan (COZAAR) 25 MG tablet Take 25 mg by mouth 2 (two) times daily. 09/28/16  Yes [provider]  Multiple Vitamins-Minerals (MULTIVITAMINS THER. W/MINERALS) TABS Take 1 tablet by mouth daily.     Yes [provider]  nadolol (CORGARD) 20 MG tablet Take 5 mg by mouth 2 (two) times daily.    Yes [provider]  TURMERIC PO Take 1,000 mg by mouth daily.   Yes [provider]  VITAMIN A PO Take 1 tablet by mouth daily.   Yes [provider]  aspirin EC 325 MG tablet Take 1 tablet (325 mg total) by mouth 2 (two) times daily after a meal. 05/16/15   Swinteck, Aaron Edelman, MD  calcium carbonate (OS-CAL - DOSED IN MG OF ELEMENTAL CALCIUM) 1250 MG tablet Take 1 tablet by mouth daily.      [provider]  diazepam (VALIUM) 5 MG tablet Take 0.5-1 mg by mouth at bedtime. 09/01/16   [provider]  docusate sodium (COLACE) 100 MG capsule Take 1 capsule (100 mg  total) by mouth 2 (two) times daily. 05/16/15   Swinteck, Aaron Edelman, MD  HYDROcodone-acetaminophen (NORCO) 5-325 MG tablet Take 1-2 tablets by mouth every 4 (four) hours as needed for moderate pain. 05/16/15   Swinteck, Aaron Edelman, MD  ibuprofen (ADVIL,MOTRIN) 200 MG tablet Take 400-600 mg by mouth at bedtime as needed. For pain    [provider]  meclizine (ANTIVERT) 25 MG tablet Take 25 mg by mouth daily as needed. For dizzines     [provider]  ondansetron (ZOFRAN) 4 MG tablet Take 1 tablet (4 mg total) by mouth every 6 (six) hours as needed for  nausea. 05/16/15   Swinteck, Aaron Edelman, MD  Polyethyl Glycol-Propyl Glycol (SYSTANE) 0.4-0.3 % SOLN Apply 2 drops to eye 3 (three) times daily as needed. For dry eyes     [provider]  senna (SENOKOT) 8.6 MG TABS tablet Take 2 tablets (17.2 mg total) by mouth at bedtime. 05/16/15   Swinteck, Aaron Edelman, MD  simvastatin (ZOCOR) 10 MG tablet Take 5 mg by mouth at bedtime.      [provider]      VITAL SIGNS:  Blood pressure (!) 205/104, pulse 69, temperature 98.4 F (36.9 C), temperature source Oral, resp. rate 18, height 5\' 5"  (1.651 m), weight 77.1 kg (170 lb), SpO2 100 %.  PHYSICAL EXAMINATION:   Physical Exam  GENERAL:  83 y.o.-year-old patient lying in the bed with no acute distress.  EYES: Pupils equal, round, reactive to light and accommodation. No scleral icterus. Extraocular muscles intact.  HEENT: Head atraumatic, normocephalic. Oropharynx and nasopharynx clear.  NECK:  Supple, no jugular venous distention. No thyroid enlargement, no tenderness.  LUNGS: Normal breath sounds bilaterally, no wheezing, rales,rhonchi or crepitation. No use of accessory muscles of respiration.  CARDIOVASCULAR: S1, S2 normal. No murmurs, rubs, or gallops.  ABDOMEN: Soft, nontender, nondistended. Bowel sounds present. No organomegaly or mass.  EXTREMITIES: No pedal edema, cyanosis, or clubbing.  NEUROLOGIC: Cranial nerves II through  XII are intact. Muscle strength 5/5 in all extremities. Sensation intact. Gait not checked. Occasional word finding difficulty PSYCHIATRIC: The patient is alert and oriented x 3.  SKIN: No obvious rash, lesion, or ulcer.   LABORATORY PANEL:   CBC  Recent Labs Lab 10/20/16 1246  WBC 7.1  HGB 14.0  HCT 40.2  PLT 174   ------------------------------------------------------------------------------------------------------------------  Chemistries   Recent Labs Lab 10/20/16 1246  NA 140  K 3.8  CL 104  CO2 28  GLUCOSE 102*  BUN 17  CREATININE 0.64  CALCIUM 8.9  AST 28  ALT 17  ALKPHOS 82  BILITOT 0.9   ------------------------------------------------------------------------------------------------------------------  Cardiac Enzymes  Recent Labs Lab 10/20/16 1246  TROPONINI <0.03   ------------------------------------------------------------------------------------------------------------------  RADIOLOGY:  Ct Head Wo Contrast  Result Date: 10/20/2016 CLINICAL DATA:  83 year old hypertensive female with right-sided headache for the past 2 days. Difficulty getting words out. Initial encounter. EXAM: CT HEAD WITHOUT CONTRAST TECHNIQUE: Contiguous axial images were obtained from the base of the skull through the vertex without intravenous contrast. COMPARISON:  None. FINDINGS: Brain: No intracranial hemorrhage. Prominent chronic microvascular changes. Infarct right caudate head probably remote although age indeterminate. No CT evidence of large acute infarct. Age related mild atrophy without hydrocephalus. Top-normal size pituitary gland. No intracranial mass lesion noted on this unenhanced exam. Vascular: Vascular calcifications. Skull: No skull fracture.  Hyperostosis frontalis interna. Sinuses/Orbits: No acute orbital abnormality post lens replacement. Visualized paranasal sinuses are clear. Other: Mastoid air cells middle ear cavities are clear. IMPRESSION: No skull  fracture or intracranial hemorrhage. Prominent chronic microvascular changes. Infarct right caudate head probably remote although age indeterminate. No CT evidence of large acute infarct. Top-normal size pituitary gland. Electronically Signed   By: Genia Del M.D.   On: 10/20/2016 13:09    EKG:   Orders placed or performed during the hospital encounter of 10/20/16  . ED EKG  . ED EKG  . EKG 12-Lead  . EKG 12-Lead    IMPRESSION AND PLAN:   Karlissa Aron  is a 83 y.o. female with a known history of Hypertension, arthritis, mitral valve prolapse, Presents to the hospital  secondary to sudden onset of right-sided headache and word finding difficulty this morning.  #1 TIA-with speech changes and word finding difficulty. -Admit, telemetry, started on aspirin and statin. -MRI, MRA of the brain, carotid Dopplers and echocardiogram ordered. -speech therapy, occupational therapy and physical therapy consults requested.  #2 hypertensive urgency-continue her home medications. Also started on hydralazine IV when necessary.  #3 right earache-could be causing her ear fullness, headache and dizziness symptoms. -Ear exam reveals significant amount of wax in the right ear. De-wax drops ordered  #4 DVT prophylaxis-on Lovenox   All the records are reviewed and case discussed with ED provider. Management plans discussed with the patient, family and they are in agreement.  CODE STATUS: Full Code  TOTAL TIME TAKING CARE OF THIS PATIENT: 50 minutes.    Gladstone Lighter M.D on 10/20/2016 at 3:23 PM  Between 7am to 6pm - Pager - [phone]  After 6pm go to [email or web address] - password [email or web address] [facility]  Office  [phone]  CC: Primary care physician; Raina Mina., MD

## 2016-10-20 NOTE — ED Notes (Signed)
Patient transported to Ultrasound 

## 2016-10-20 NOTE — ED Notes (Signed)
FN: pt with high blood pressure, pain in head and ear pain, confusion, slurred  Speech all started on Saturday.

## 2016-10-20 NOTE — ED Notes (Signed)
Notified Megan on 1C pt was taken to MRI and from there pt will go to 1C. Jinny Blossom, RN verbalized understanding of this.

## 2016-10-20 NOTE — ED Notes (Signed)
Pt arrives to ER via POV for headache to right side only since Saturday, hypertension and trouble getting words out. Pt had witnessed fall Saturday and unwitnessed fall today. Pt is able to answer questions, just takes a while for patient to find words; which is unlike patient per family.

## 2016-10-20 NOTE — ED Provider Notes (Signed)
[facility] Emergency Department Provider Note  ____________________________________________   First MD Initiated Contact with Patient 10/20/16 1252     (approximate)  I have reviewed the triage vital signs and the nursing notes.   HISTORY  Chief Complaint Altered Mental Status and Headache   HPI Chelsea Mcintyre is a 83 y.o. female with a history of hypertension is present. Emergency department today with confusion as well as frequent falls. Over the past several weeks she has had very frequent falls but today, according to her daughter-in-law, she has been quite confused. Her daughter-in-law says that she is very sharp usually but says that today she is having difficulty finding her words. Unclear exactly when the patient had this onset of confusion but it was at least before going to sleep last night. Patient denies any pain. Patient denies any shortness of breath. Reported by medics and the patient was having a right-sided headache. However, the patient does not report this to me.   Past Medical History:  Diagnosis Date  . Arthritis    bilateral hips and bilateral shoulders.  . Cancer ([place])    skin back, nose  . Complication of anesthesia    heart stopped with the anesthesia x1,  difficult to wake up  . Dysrhythmia    if taking certain medications  . Family history of adverse reaction to anesthesia    sister slow to awaken- "almost died last time"  . Frequency of urination   . Hypertension   . Jaundice    as a child  . MVP (mitral valve prolapse)   . Pneumonia 09   hx  . 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 Recurrent upper respiratory infection (URI)    Dx 1st of september with sinus infection, placed on antibiotics at that time    Patient Active Problem List   Diagnosis Date Noted  . Primary osteoarthritis of left hip 05/15/2015  . Lumbar compression fracture ([place]) 04/09/2011    Past  Surgical History:  Procedure Laterality Date  . ABDOMINAL HYSTERECTOMY  73   partial  . APPENDECTOMY    . BACK SURGERY  2012   kyphoplasty  . BREAST SURGERY     1982,70rt  . CARDIAC CATHETERIZATION     1994, detected MVP  . ELBOW FRACTURE SURGERY Left 98,   hardware removed later in year  . EYE SURGERY     bilateral cataract surgery 2007 with lens implant  . fibroid cyst     ovary right removed 1966  . fibroid tumor     1970, right breast removed  . FRACTURE SURGERY     1988 shattered elbow left  . HARDWARE REMOVAL     left elbow 1998  . TONSILLECTOMY  44   and adenoids  . TOTAL HIP ARTHROPLASTY Left 05/15/2015   Procedure: LEFT TOTAL HIP ARTHROPLASTY ANTERIOR APPROACH;  Surgeon: Rod Can, MD;  Location: [facility];  Service: Orthopedics;  Laterality: Left;    Prior to Admission medications   Medication Sig Start Date End Date Taking? Authorizing Provider  aspirin EC 325 MG tablet Take 1 tablet (325 mg total) by mouth 2 (two) times daily after a meal. 05/16/15   Swinteck, Aaron Edelman, MD  calcium carbonate (OS-CAL - DOSED IN MG OF ELEMENTAL CALCIUM) 1250 MG tablet Take 1 tablet by mouth daily.      [provider]  cholecalciferol (VITAMIN D) 1000 UNITS tablet Take  2,000 Units by mouth daily.      [provider]  diazepam (VALIUM) 5 MG tablet Take 0.5-1 mg by mouth at bedtime. 09/01/16   [provider]  docusate sodium (COLACE) 100 MG capsule Take 1 capsule (100 mg total) by mouth 2 (two) times daily. 05/16/15   Swinteck, Aaron Edelman, MD  HYDROcodone-acetaminophen (NORCO) 5-325 MG tablet Take 1-2 tablets by mouth every 4 (four) hours as needed for moderate pain. 05/16/15   Swinteck, Aaron Edelman, MD  ibuprofen (ADVIL,MOTRIN) 200 MG tablet Take 400-600 mg by mouth at bedtime as needed. For pain    [provider]  losartan (COZAAR) 25 MG tablet Take 25 mg by mouth 2 (two) times daily. 09/28/16   [provider]  meclizine (ANTIVERT) 25 MG tablet Take  25 mg by mouth daily as needed. For dizzines     [provider]  Multiple Vitamins-Minerals (MULTIVITAMINS THER. W/MINERALS) TABS Take 1 tablet by mouth daily.      [provider]  nadolol (CORGARD) 20 MG tablet Take 5 mg by mouth 2 (two) times daily.     [provider]  ondansetron (ZOFRAN) 4 MG tablet Take 1 tablet (4 mg total) by mouth every 6 (six) hours as needed for nausea. 05/16/15   Swinteck, Aaron Edelman, MD  Polyethyl Glycol-Propyl Glycol (SYSTANE) 0.4-0.3 % SOLN Apply 2 drops to eye 3 (three) times daily as needed. For dry eyes     [provider]  senna (SENOKOT) 8.6 MG TABS tablet Take 2 tablets (17.2 mg total) by mouth at bedtime. 05/16/15   Swinteck, Aaron Edelman, MD  simvastatin (ZOCOR) 10 MG tablet Take 5 mg by mouth at bedtime.      [provider]  TURMERIC PO Take 1,000 mg by mouth daily.    [provider]    Allergies Sulfa drugs cross reactors; Gluten meal; Tape; and Latex  Family History  Problem Relation Age of Onset  . Anesthesia problems Father     Social History Social History  Substance Use Topics  . Smoking status: Former Smoker    Packs/day: 0.50    Years: 2.00  . Smokeless tobacco: Not on file     Comment: off and on, non smoker presently  . Alcohol use No    Review of Systems  Constitutional: No fever/chills Eyes: No visual changes. ENT: No sore throat. Cardiovascular: Denies chest pain. Respiratory: Denies shortness of breath. Gastrointestinal: No abdominal pain.  No nausea, no vomiting.  No diarrhea.  No constipation. Genitourinary: Negative for dysuria. Musculoskeletal: Negative for back pain. Skin: Negative for rash. Neurological: Negative for focal weakness or numbness.   ____________________________________________   PHYSICAL EXAM:  VITAL SIGNS: ED Triage Vitals  Enc Vitals Group     BP 10/20/16 1242 (!) 205/104     Pulse Rate 10/20/16 1242 69     Resp 10/20/16 1242 18     Temp  10/20/16 1242 98.4 F (36.9 C)     Temp Source 10/20/16 1242 Oral     SpO2 10/20/16 1242 100 %     Weight 10/20/16 1242 170 lb (77.1 kg)     Height 10/20/16 1242 5\' 5"  (1.651 m)     Head Circumference --      Peak Flow --      Pain Score 10/20/16 1241 8     Pain Loc --      Pain Edu? --      Excl. in [place]? --     Constitutional: Alert  and oriented To self and location but not to the year. Well appearing and in no acute distress.  Slight word finding difficulty without any slurring.  Eyes: Conjunctivae are normal.  Head: Atraumatic. Nose: No congestion/rhinnorhea. Mouth/Throat: Mucous membranes are moist.  Neck: No stridor.   Cardiovascular: Normal rate, regular rhythm. Grossly normal heart sounds.   Respiratory: Normal respiratory effort.  No retractions. Lungs CTAB. Gastrointestinal: Soft and nontender. No distention.  Musculoskeletal: No lower extremity tenderness nor edema.  No joint effusions. Neurologic:  Normal speech and language. No gross focal neurologic deficits are appreciated. No ataxia on finger to nose testing. Skin:  Skin is warm, dry and intact. No rash noted. Psychiatric: Mood and affect are normal. Speech and behavior are normal.  NIH Stroke Scale  Person Administering Scale: Doran Stabler  Administer stroke scale items in the order listed. Record performance in each category after each subscale exam. Do not go back and change scores. Follow directions provided for each exam technique. Scores should reflect what the patient does, not what the clinician thinks the patient can do. The clinician should record answers while administering the exam and work quickly. Except where indicated, the patient should not be coached (i.e., repeated requests to patient to make a special effort).   1a  Level of consciousness: 0=alert; keenly responsive  1b. LOC questions:  0=Performs both tasks correctly  1c. LOC commands: 0=Performs both tasks correctly  2.  Best Gaze:  0=normal  3.  Visual: 0=No visual loss  4. Facial Palsy: 0=Normal symmetric movement  5a.  Motor left arm: 0=No drift, limb holds 90 (or 45) degrees for full 10 seconds  5b.  Motor right arm: 0=No drift, limb holds 90 (or 45) degrees for full 10 seconds  6a. motor left leg: 0=No drift, limb holds 90 (or 45) degrees for full 10 seconds  6b  Motor right leg:  0=No drift, limb holds 90 (or 45) degrees for full 10 seconds  7. Limb Ataxia: 0=Absent  8.  Sensory: 0=Normal; no sensory loss  9. Best Language:  0=No aphasia, normal  10. Dysarthria: 0=Normal  11. Extinction and Inattention: 0=No abnormality  12. Distal motor function: 0=Normal   Total:   0   ____________________________________________   LABS (all labs ordered are listed, but only abnormal results are displayed)  Labs Reviewed  COMPREHENSIVE METABOLIC PANEL - Abnormal; Notable for the following:       Result Value   Glucose, Bld 102 (*)    All other components within normal limits  URINALYSIS, COMPLETE (UACMP) WITH MICROSCOPIC - Abnormal; Notable for the following:    Color, Urine STRAW (*)    APPearance CLEAR (*)    Bacteria, UA RARE (*)    All other components within normal limits  PROTIME-INR  APTT  CBC  DIFFERENTIAL  TROPONIN I  GLUCOSE, CAPILLARY  CBG MONITORING, ED   ____________________________________________  EKG  ED ECG REPORT I, Doran Stabler, the attending physician, personally viewed and interpreted this ECG.   Date: 10/20/2016  EKG Time: 1305  Rate: 68  Rhythm: normal sinus rhythm  Axis: normal  Intervals:none  ST&T Change: No ST segment elevation or depression. No abnormal T-wave inversions.  ____________________________________________  RADIOLOGY  Age indeterminate infarct the right caudate. ____________________________________________   PROCEDURES  Procedure(s) performed:   Procedures  Critical Care performed:   ____________________________________________   INITIAL  IMPRESSION / ASSESSMENT AND PLAN / ED COURSE  Pertinent labs & imaging results that were  available during my care of the patient were reviewed by me and considered in my medical decision making (see chart for details).    ----------------------------------------- 2:35 PM on 10/20/2016 -----------------------------------------  Patient with slight difficulty word finding and slight disorientation. Intermittent infarct in the caudate. Patient to be admitted to the hospital for further workup. Explained this to the patient as well as family who is at the bedside. The patient family are understanding of when to comply. Signed out to Dr. Carlyle Dolly.      ____________________________________________   FINAL CLINICAL IMPRESSION(S) / ED DIAGNOSES  CVA    NEW MEDICATIONS STARTED DURING THIS VISIT:  New Prescriptions   No medications on file     Note:  This document was prepared using Dragon voice recognition software and may include unintentional dictation errors.     Orbie Pyo, MD 10/20/16 [phone]

## 2016-10-20 NOTE — ED Notes (Signed)
Pt on the phone at this time with MRI technologist.

## 2016-10-21 ENCOUNTER — Observation Stay (HOSPITAL_BASED_OUTPATIENT_CLINIC_OR_DEPARTMENT_OTHER)
Admit: 2016-10-21 | Discharge: 2016-10-21 | Disposition: A | Discharge status: Home / Self Care | Payer: Medicare Other | Attending: Internal Medicine | Admitting: Internal Medicine

## 2016-10-21 DIAGNOSIS — G459 Transient cerebral ischemic attack, unspecified: Principal | ICD-10-CM

## 2016-10-21 LAB — CBC
HCT: 37.5 % (ref 35.0–47.0)
Hemoglobin: 13.1 g/dL (ref 12.0–16.0)
MCH: 31.7 pg (ref 26.0–34.0)
MCHC: 35 g/dL (ref 32.0–36.0)
MCV: 90.4 fL (ref 80.0–100.0)
PLATELETS: 151 10*3/uL (ref 150–440)
RBC: 4.14 MIL/uL (ref 3.80–5.20)
RDW: 12.8 % (ref 11.5–14.5)
WBC: 4.6 10*3/uL (ref 3.6–11.0)

## 2016-10-21 LAB — BASIC METABOLIC PANEL
Anion gap: 6 (ref 5–15)
BUN: 16 mg/dL (ref 6–20)
CALCIUM: 8.7 mg/dL — AB (ref 8.9–10.3)
CO2: 28 mmol/L (ref 22–32)
CREATININE: 0.62 mg/dL (ref 0.44–1.00)
Chloride: 107 mmol/L (ref 101–111)
Glucose, Bld: 89 mg/dL (ref 65–99)
Potassium: 3.3 mmol/L — ABNORMAL LOW (ref 3.5–5.1)
SODIUM: 141 mmol/L (ref 135–145)

## 2016-10-21 MED ORDER — ATORVASTATIN CALCIUM 40 MG PO TABS: 40.0000 mg | ORAL_TABLET | Freq: Every day | ORAL | 0 refills | Status: DC

## 2016-10-21 MED ORDER — HYDROCHLOROTHIAZIDE 12.5 MG PO CAPS: 12.5000 mg | ORAL_CAPSULE | Freq: Every day | ORAL | 0 refills | Status: DC

## 2016-10-21 MED ORDER — CLOPIDOGREL BISULFATE 75 MG PO TABS: 75.0000 mg | ORAL_TABLET | Freq: Every day | ORAL | 0 refills | Status: AC

## 2016-10-21 MED ORDER — HYDROCHLOROTHIAZIDE 12.5 MG PO CAPS
12.5000 mg | ORAL_CAPSULE | Freq: Every day | ORAL | Status: DC
  Administered 2016-10-21: 13:00:00 12.5 mg via ORAL
  Filled 2016-10-21: qty 1

## 2016-10-21 MED ORDER — POTASSIUM CHLORIDE CRYS ER 20 MEQ PO TBCR
40.0000 meq | EXTENDED_RELEASE_TABLET | Freq: Once | ORAL | Status: AC
  Administered 2016-10-21: 09:00:00 40 meq via ORAL
  Filled 2016-10-21: qty 2

## 2016-10-21 NOTE — Progress Notes (Signed)
*  PRELIMINARY RESULTS* Echocardiogram 2D Echocardiogram has been performed.  Sherrie Sport 10/21/2016, 9:08 AM

## 2016-10-21 NOTE — Evaluation (Signed)
Physical Therapy Evaluation Patient Details Name: Chelsea Mcintyre MRN: [id number] DOB: [birth year] Today's Date: 10/21/2016   History of Present Illness  Pt is a 83 y.o.femalewith a known history of Hypertension, arthritis, mitral valve prolapse, urinary frequency, L THA (2016), and hx of multiple falls who presented to ED on 6/4 with sudden onset of right-sided headache and word finding difficulty that morning. MRI indicates no acute changes, MRA shows no evidence of large vessel occlusion. TIA likely, per neurology.  Clinical Impression  Patient without gross focal weakness or sensory deficit noted.  Demonstrates ability to complete bed mobility indep; sit/stand, basic transfers and gait (200') without assist device, cga/min assist.  Decreased reaction time and bilat LE (L > R) balance reactions, relying on LE step strategy and min assist from therapist for balance recovery.  BERG score 33/56, indicative of increased fall risk.  Did trial RW with note dimprovement in performance to cga/close sup; do recommend continued use of RW for all mobility at this time. Patient/family voiced understanding. Would benefit from skilled PT to address above deficits and promote optimal return to PLOF; Recommend transition to [facility] upon discharge from acute hospitalization.     Follow Up Recommendations Home health PT    Equipment Recommendations  Rolling walker with 5" wheels (patient reports she has one in home environment)    Recommendations for Other Services       Precautions / Restrictions Precautions Precautions: Fall Restrictions Weight Bearing Restrictions: No      Mobility  Bed Mobility Overal bed mobility: Modified Independent                Transfers Overall transfer level: Needs assistance Equipment used: None Transfers: Sit to/from Stand Sit to Stand: Min guard;Min assist            Ambulation/Gait Ambulation/Gait assistance: Min guard;Min assist Ambulation Distance  (Feet): 200 Feet Assistive device: None       General Gait Details: slow, guarded cadence with decreased trunk rotation/L arm swing; mild weight shift to L LE thorughout gait cycle with marked decrease in gait speed with dynamic gait components.  Staggers L at times (esp with head turns), requiring LE step strategy and min assist from therapist for recovery.  Stairs            Wheelchair Mobility    Modified Rankin (Stroke Patients Only)       Balance Overall balance assessment: Needs assistance Sitting-balance support: No upper extremity supported;Feet supported Sitting balance-Leahy Scale: Normal     Standing balance support: No upper extremity supported Standing balance-Leahy Scale: Fair   Single Leg Stance - Right Leg: 6 Single Leg Stance - Left Leg: 4             Standardized Balance Assessment Standardized Balance Assessment : Berg Balance Test Berg Balance Test Sit to Stand: Able to stand  independently using hands Standing Unsupported: Able to stand 2 minutes with supervision Sitting with Back Unsupported but Feet Supported on Floor or Stool: Able to sit safely and securely 2 minutes Stand to Sit: Controls descent by using hands Transfers: Able to transfer safely, definite need of hands Standing Unsupported with Eyes Closed: Able to stand 10 seconds with supervision Standing Ubsupported with Feet Together: Able to place feet together independently and stand for 1 minute with supervision From Standing, Reach Forward with Outstretched Arm: Reaches forward but needs supervision From Standing Position, Pick up Object from Floor: Able to pick up shoe, needs supervision From Standing Position,  Turn to Look Behind Over each Shoulder: Looks behind one side only/other side shows less weight shift Turn 360 Degrees: Needs close supervision or verbal cueing Standing Unsupported, Alternately Place Feet on Step/Stool: Able to complete >2 steps/needs minimal  assist Standing Unsupported, One Foot in Front: Needs help to step but can hold 15 seconds Standing on One Leg: Tries to lift leg/unable to hold 3 seconds but remains standing independently Total Score: 33         Pertinent Vitals/Pain Pain Assessment: No/denies pain    Home Living Family/patient expects to be discharged to:: Private residence Living Arrangements: Alone Available Help at Discharge: Family;Available PRN/intermittently Type of Home: House Home Access: Stairs to enter Entrance Stairs-Rails: Can reach both;Left;Right Entrance Stairs-Number of Steps: 3 steps in front, 2 steps at side Home Layout: Two level;Able to live on main level with bedroom/bathroom Home Equipment: Gilford Rile - 2 wheels;Cane - single point;Bedside commode;Shower seat      Prior Function Level of Independence: Independent   Gait / Transfers Assistance Needed: Indep with ADLs, household and community mobility prior to move recent fall (approx 7 weeks ago) in which she sustained L shoulder/UE fracture (clear of restrictions per patient/family report).  Does endorse at least 5 falls in previous six months           Hand Dominance        Extremity/Trunk Assessment   Upper Extremity Assessment Upper Extremity Assessment:  (L UE to shoulder height actively )    Lower Extremity Assessment Lower Extremity Assessment: Overall WFL for tasks assessed (grossly at least 4+/5 throughout; no focal weakness or sensory deficit reported)       Communication      Cognition Arousal/Alertness: Awake/alert Behavior During Therapy: WFL for tasks assessed/performed Overall Cognitive Status: Within Functional Limits for tasks assessed                                        General Comments      Exercises Other Exercises Other Exercises: 200' with RW, cga/close sup-marked improvement in overall fluidity, symmetry of gait performance; improved cadence and gait speed.  Recommend continued  use of RW for all mobiltiy at this time.  PAtient/family verbalized undrstanding. Other Exercises: Brief visual assessment reveals good ocular ROM and control; good smooth pursuit with no nystagmus noted during tracking/end-range.  Mild decrease in convergence R eye Other Exercises: Toilet transfer without assist device, cga/min assist; sit/satnd from stanard toilet, cga/min assist.  Standing functional reach outside immediate BOS approx 3-4", often requiring contralateral UE support for optimal safety   Assessment/Plan    PT Assessment Patient needs continued PT services  PT Problem List Decreased activity tolerance;Decreased balance;Decreased mobility       PT Treatment Interventions DME instruction;Gait training;Stair training;Functional mobility training;Therapeutic activities;Therapeutic exercise;Balance training;Neuromuscular re-education;Patient/family education    PT Goals (Current goals can be found in the Care Plan section)  Acute Rehab PT Goals Patient Stated Goal: get better PT Goal Formulation: With patient/family Time For Goal Achievement: 11/04/16 Potential to Achieve Goals: Good    Frequency Min 2X/week   Barriers to discharge Decreased caregiver support      Co-evaluation               AM-PAC PT "6 Clicks" Daily Activity  Outcome Measure Difficulty turning over in bed (including adjusting bedclothes, sheets and blankets)?: None Difficulty moving from lying on back to  sitting on the side of the bed? : None Difficulty sitting down on and standing up from a chair with arms (e.g., wheelchair, bedside commode, etc,.)?: Total Help needed moving to and from a bed to chair (including a wheelchair)?: A Little Help needed walking in hospital room?: A Little Help needed climbing 3-5 steps with a railing? : A Lot 6 Click Score: 17    End of Session Equipment Utilized During Treatment: Gait belt Activity Tolerance: Patient tolerated treatment well Patient left: in  chair;with call bell/phone within reach;with chair alarm set;with family/visitor present Nurse Communication: Mobility status PT Visit Diagnosis: History of falling (Z91.81)    Time: 8257-4935 PT Time Calculation (min) (ACUTE ONLY): 46 min   Charges:   PT Evaluation $PT Eval Low Complexity: 1 Procedure PT Treatments $Gait Training: 8-22 mins $Therapeutic Activity: 8-22 mins   PT G Codes:   PT G-Codes **NOT FOR INPATIENT CLASS** Functional Assessment Tool Used: AM-PAC 6 Clicks Basic Mobility Functional Limitation: Mobility: Walking and moving around Mobility: Walking and Moving Around Current Status (L2174): At least 20 percent but less than 40 percent impaired, limited or restricted Mobility: Walking and Moving Around Goal Status [phone]): At least 1 percent but less than 20 percent impaired, limited or restricted    Lynnet Hefley H. Owens Shark, PT, DPT, NCS 10/21/16, 11:23 PM [phone]

## 2016-10-21 NOTE — Evaluation (Signed)
Occupational Therapy Evaluation Patient Details Name: Chelsea Mcintyre MRN: [id number] DOB: Feb 28, 1933 Today's Date: 10/21/2016    History of Present Illness Pt is a 83 y.o.femalewith a known history of Hypertension, arthritis, mitral valve prolapse, urinary frequency, L THA (2016), and hx of multiple falls who presented to ED on 6/4 with sudden onset of right-sided headache and word finding difficulty that morning. MRI indicates no acute changes, MRA shows no evidence of large vessel occlusion. TIA likely, per neurology.   Clinical Impression   Pt seen for OT evaluation this date. Pt was independent with basic ADL, living alone with good family support, only requiring assist for driving following recent L shoulder injury. Pt endorses multiple falls in past vew months ("at least 2-3 per month") due to "balance and not picking up my feet high enough and tripping" as well as dizziness per pt report. Pt wears hearing aids (at home currently) and pt/chart review indicates significant build up of wax in R ear, which may be related to impaired balance/falls. Pt fatigued after "busy day" with testing, other therapies, so functional mobility deferred at this time. Pt will benefit from skilled OT services to address impairments in balance/strength/ROM/activity tolerance/knowledge of AE/DME/compensatory strategies for self care tasks, and falls prevention education/training in order to maximize return to PLOF and minimize future risk of falls/injury/rehospitalization.     Follow Up Recommendations  Outpatient OT    Equipment Recommendations  Other (comment) (grab bars in shower/near toilet)    Recommendations for Other Services       Precautions / Restrictions Precautions Precautions: Fall Restrictions Weight Bearing Restrictions: Yes LUE Weight Bearing: Non weight bearing (per RN note, NWB'ing through LUE, pt reports no restrictions)      Mobility Bed Mobility Overal bed mobility: Modified  Independent             General bed mobility comments: slightly additional time/effort to perform, no physical assist required, good safety awareness  Transfers                 General transfer comment: deferred due to pt fatigue/safety    Balance Overall balance assessment: History of Falls;Needs assistance Sitting-balance support: No upper extremity supported;Feet supported Sitting balance-Leahy Scale: Good                                     ADL either performed or assessed with clinical judgement   ADL Overall ADL's : Needs assistance/impaired Eating/Feeding: Sitting;Set up   Grooming: Sitting;Set up   Upper Body Bathing: Minimal assistance;Moderate assistance;Sitting   Lower Body Bathing: Supervison/ safety;Sitting/lateral leans;Set up   Upper Body Dressing : Set up;Supervision/safety;Sitting   Lower Body Dressing: Supervision/safety;Set up;Sitting/lateral leans Lower Body Dressing Details (indicate cue type and reason): pt able to don/doff socks with supervision seated EOB using non-dominant RUE with no LOB   Toilet Transfer Details (indicate cue type and reason): deferred due to fatigue/safety           General ADL Comments: pt generally min assist level with UB ADL due to LUE impairments at this time, functional mobility deferred due to fatigue/safety     Vision Baseline Vision/History: Wears glasses Wears Glasses: Reading only Patient Visual Report: No change from baseline Vision Assessment?: No apparent visual deficits     Perception     Praxis      Pertinent Vitals/Pain Pain Assessment: 0-10 Pain Location: unable to report a number  but states "it's not really pain, just a consciousness of feeling of lingering headache" that "darts around" in her head Pain Intervention(s): Monitored during session;Limited activity within patient's tolerance;Repositioned     Hand Dominance Left   Extremity/Trunk Assessment Upper Extremity  Assessment Upper Extremity Assessment: LUE deficits/detail (RUE WFL) LUE Deficits / Details: pt reports no current restrictions in movement with LUE, demonstrates approx 75% AROM with shoulder flexion independently with no pain, good grip strength, deferred all additional ROM/strength testing    Lower Extremity Assessment Lower Extremity Assessment: Defer to PT evaluation;Overall [facility] for tasks assessed   Cervical / Trunk Assessment Cervical / Trunk Assessment: Normal   Communication Communication Communication: No difficulties (slight word finding difficulties, but with a few seconds, able to find the word)   Cognition Arousal/Alertness: Awake/alert Behavior During Therapy: WFL for tasks assessed/performed Overall Cognitive Status: Within Functional Limits for tasks assessed                                 General Comments: A&Ox4, follows all commands consistently, good safety awareness, slight word finding difficulties but with a few extra seconds able to retrieve the word she is looking for   General Comments       Exercises Other Exercises Other Exercises: pt educated in body positioning and environmental set up to minimize falls risk, pt verbalized understanding, would benefit from additional education/training to support recall and carry over of learned techniques   Shoulder Instructions      Home Living Family/patient expects to be discharged to:: Private residence Living Arrangements: Alone Available Help at Discharge: Family;Available 24 hours/day;Available PRN/intermittently Type of Home: House Home Access: Stairs to enter [employer] of Steps: 3 steps in front, 2 steps at side Entrance Stairs-Rails: Can reach both;Left;Right Home Layout: Two level;Able to live on main level with bedroom/bathroom     Bathroom Shower/Tub: Occupational psychologist: Handicapped height Bathroom Accessibility: Yes How Accessible: Accessible via  walker Home Equipment: [place] - 2 wheels;Cane - single point;Bedside commode;Shower seat          Prior Functioning/Environment Level of Independence: Needs assistance  Gait / Transfers Assistance Needed: pt using SPC recently after broken shoulder per pt report ADL's / Homemaking Assistance Needed: pt indep with ADL and IADL (1 handed dressing techniques due to LUE restrictions); family has been driving her to/from appointments while she is healing from the broken shoulder            OT Problem List: Decreased strength;Decreased range of motion;Decreased cognition;Decreased activity tolerance;Decreased knowledge of use of DME or AE;Impaired balance (sitting and/or standing);Impaired UE functional use      OT Treatment/Interventions: Self-care/ADL training;Therapeutic exercise;Therapeutic activities;Neuromuscular education;Energy conservation;DME and/or AE instruction;Patient/family education;Balance training    OT Goals(Current goals can be found in the care plan section) Acute Rehab OT Goals Patient Stated Goal: get better OT Goal Formulation: With patient Time For Goal Achievement: 11/04/16 Potential to Achieve Goals: Good  OT Frequency: Min 1X/week   Barriers to D/C:            Co-evaluation              AM-PAC PT "6 Clicks" Daily Activity     Outcome Measure Help from another person eating meals?: None Help from another person taking care of personal grooming?: None Help from another person toileting, which includes using toliet, bedpan, or urinal?: A Lot Help from another person  bathing (including washing, rinsing, drying)?: A Little Help from another person to put on and taking off regular upper body clothing?: A Little Help from another person to put on and taking off regular lower body clothing?: A Little 6 Click Score: 19   End of Session    Activity Tolerance: Patient limited by fatigue Patient left: in bed;with call bell/phone within reach;with bed  alarm set  OT Visit Diagnosis: Other abnormalities of gait and mobility (R26.89);Repeated falls (R29.6);History of falling (Z91.81);Muscle weakness (generalized) (M62.81)                Time: 9784-7841 OT Time Calculation (min): 26 min Charges:  OT General Charges $OT Visit: 1 Procedure OT Evaluation $OT Eval Low Complexity: 1 Procedure OT Treatments $Self Care/Home Management : 8-22 mins G-Codes: OT G-codes **NOT FOR INPATIENT CLASS** Functional Assessment Tool Used: AM-PAC 6 Clicks Daily Activity;Clinical judgement Functional Limitation: Self care Self Care Current Status (Q8208): At least 40 percent but less than 60 percent impaired, limited or restricted Self Care Goal Status (H3887): At least 40 percent but less than 60 percent impaired, limited or restricted   Jeni Salles, MPH, MS, OTR/L ascom [phone] 10/21/16, 2:41 PM'

## 2016-10-21 NOTE — Discharge Instructions (Signed)
Heart healthy diet. °Activity as tolerated. °

## 2016-10-21 NOTE — Care Management (Signed)
Admitted to this facility under observation status with the diagnosis of TIA. Lives alone in [place].  Daughter is [employer] [phone]). Sees Dr. Bea Graff every 6 months.  [place] in 2016 following surgery. No skilled facility. No home oxygen. [place], [place], and bedside commode in the home., Prescriptions are filled at [facility] in [place]. Takes care of all basic and instrumental activities of daily living herself, usually drives. Broke shoulder 7 weeks ago. Family helps with errands at present. Family will transport. Shelbie Ammons RN MSN CCM Care Management [phone]

## 2016-10-21 NOTE — Progress Notes (Signed)
OT Cancellation Note  Patient Details Name: Chelsea Mcintyre MRN: [id number] DOB: 1933-04-14   Cancelled Treatment:    Reason Eval/Treat Not Completed: Other (comment). Pt back from echo, with nursing who requested OT come back a little later after pt has had a chance to take medications. Will re-attempt OT evaluation at later time.   Jeni Salles, MPH, MS, OTR/L ascom [phone] 10/21/16, 9:53 AM

## 2016-10-21 NOTE — Plan of Care (Signed)
Pt d/ced home.  Continues to have some indescribable sensations R side of front and back of head.  Not really pain or pressure.  Pt ambulating well - encouraged to use walker at home b/c of 5 recent falls.  MRI showed no acute infarct or changes. She does still have some deficit with speech - she knows what she wants to say but has difficulty  Dr. Doy Mince feels this was probably a TIA.  Pt's BP has been elevated and HCTZ has been added to her regimen.  IV removed by nurse tech.  Patient will go home and family very involved in her care.  Pt's daughter in law is a Marine scientist here at [facility].  D/c instructions reviewed.  Information on HCTZ provided. F/u appts reviewed.

## 2016-10-21 NOTE — Progress Notes (Signed)
SLP Cancellation Note  Patient Details Name: Chelsea Mcintyre MRN: [id number] DOB: October 23, 1932   Cancelled treatment:       Reason Eval/Treat Not Completed: SLP screened, no needs identified, will sign off (chart reviewed; consulted pt and family; NSG). Pt and family denied any difficulty swallowing today. Pt is currently on a regular diet; tolerates swallowing pills w/ water per NSG and family. Pt conversed at conversational level w/ min slower speech noted; pt and family indicated speech-language abilities were "much better today" than at admission.  No further skilled ST services indicated as pt appears at her baseline for swallowing. Pt agreed to allow SLP to return tomorrow for further discussion re: speech-language skills. Family and NSG updated.    Orinda Kenner, [place], CCC-SLP Watson,Katherine 10/21/2016, 3:17 PM

## 2016-10-21 NOTE — Progress Notes (Signed)
OT Cancellation Note  Patient Details Name: IESHIA HATCHER MRN: [id number] DOB: 12/28/1932   Cancelled Treatment:    Reason Eval/Treat Not Completed: Patient at procedure or test/ unavailable. Order received, chart reviewed. Pt preparing to leave room for echo study. Will re-attempt OT evaluation at later time as pt is available.  Jeni Salles, MPH, MS, OTR/L ascom [phone] 10/21/16, 8:27 AM

## 2016-10-21 NOTE — Consult Note (Signed)
Referring Physician: Sudini    Chief Complaint: Difficulty with speech  HPI: Chelsea Mcintyre is an 83 y.o. female who awakened at baseline yesterday.  Went to talk on the phone and was unable to arrange her words.  Had word finding difficulty.  Reported some dizziness as well.  Also reported a severe headache radiating from the to of her head to the back.  With no improvement in her symptoms was brought to the ED for evaluation.  Although worsened en route, she improved on presentation.  Initial NIHSS of 0. Family feels patient still slow but much improved from initial onset.  She reports headache just some occasional shooting pains at this point.  BP elevated on presentation.    Date last known well: Date: 10/20/2016 Time last known well: Time: 10:00 tPA Given: No: Improvement in symptoms  Past Medical History:  Diagnosis Date  . Arthritis    bilateral hips and bilateral shoulders.  . Cancer ([place])    skin back, nose  . Complication of anesthesia    heart stopped with the anesthesia x1,  difficult to wake up  . Dysrhythmia    if taking certain medications  . Family history of adverse reaction to anesthesia    sister slow to awaken- "almost died last time"  . Frequency of urination   . Hypertension   . Jaundice    as a child  . MVP (mitral valve prolapse)   . Pneumonia 09   hx  . 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 Recurrent upper respiratory infection (URI)    Dx 1st of september with sinus infection, placed on antibiotics at that time    Past Surgical History:  Procedure Laterality Date  . ABDOMINAL HYSTERECTOMY  75   partial  . APPENDECTOMY    . BACK SURGERY  2012   kyphoplasty  . BREAST SURGERY     1982,70rt  . CARDIAC CATHETERIZATION     1994, detected MVP  . ELBOW FRACTURE SURGERY Left 98,   hardware removed later in year  . EYE SURGERY     bilateral cataract surgery 2007 with lens implant  . fibroid cyst      ovary right removed 1966  . fibroid tumor     1970, right breast removed  . FRACTURE SURGERY     1988 shattered elbow left  . HARDWARE REMOVAL     left elbow 1998  . TONSILLECTOMY  44   and adenoids  . TOTAL HIP ARTHROPLASTY Left 05/15/2015   Procedure: LEFT TOTAL HIP ARTHROPLASTY ANTERIOR APPROACH;  Surgeon: Rod Can, MD;  Location: [facility];  Service: Orthopedics;  Laterality: Left;    Family History  Problem Relation Age of Onset  . Anesthesia problems Father    Social History:  reports that she has quit smoking. She has a 1.00 pack-year smoking history. She has never used smokeless tobacco. She reports that she does not drink alcohol or use drugs.  Allergies:  Allergies  Allergen Reactions  . Sulfa Drugs Cross Reactors Shortness Of Breath and Rash  . Gluten Meal Nausea Only    Bothers her stomach   . Tape Other (See Comments)    Layer of skin peels off and area becomes raw  . Latex Hives  . Lisinopril Cough  . Amlodipine Rash    Medications:  I have reviewed the patient's current medications. Prior to Admission:  Prescriptions Prior to Admission  Medication  Sig Dispense Refill Last Dose  . aspirin EC 81 MG tablet Take 81 mg by mouth daily.   10/20/2016 at 0930  . cholecalciferol (VITAMIN D) 1000 UNITS tablet Take 2,000 Units by mouth daily.     10/20/2016 at Unknown time  . COD LIVER OIL PO Take 1 tablet by mouth daily.   10/20/2016 at 0930  . losartan (COZAAR) 25 MG tablet Take 25 mg by mouth 2 (two) times daily.  0 10/20/2016 at Unknown time  . Multiple Vitamins-Minerals (MULTIVITAMINS THER. W/MINERALS) TABS Take 1 tablet by mouth daily.     10/20/2016 at Unknown time  . nadolol (CORGARD) 20 MG tablet Take 5 mg by mouth 2 (two) times daily.    10/20/2016 at 0930  . TURMERIC PO Take 1,000 mg by mouth daily.   prn at prn  . VITAMIN A PO Take 1 tablet by mouth daily.   10/20/2016 at 0930  . aspirin EC 325 MG tablet Take 1 tablet (325 mg total) by mouth 2 (two) times daily after  a meal. 60 tablet 0   . calcium carbonate (OS-CAL - DOSED IN MG OF ELEMENTAL CALCIUM) 1250 MG tablet Take 1 tablet by mouth daily.     5 days  . diazepam (VALIUM) 5 MG tablet Take 0.5-1 mg by mouth at bedtime.  0   . docusate sodium (COLACE) 100 MG capsule Take 1 capsule (100 mg total) by mouth 2 (two) times daily. 60 capsule 3   . HYDROcodone-acetaminophen (NORCO) 5-325 MG tablet Take 1-2 tablets by mouth every 4 (four) hours as needed for moderate pain. 90 tablet 0   . ibuprofen (ADVIL,MOTRIN) 200 MG tablet Take 400-600 mg by mouth at bedtime as needed. For pain   prn at prn  . meclizine (ANTIVERT) 25 MG tablet Take 25 mg by mouth daily as needed. For dizzines    prn at prn  . ondansetron (ZOFRAN) 4 MG tablet Take 1 tablet (4 mg total) by mouth every 6 (six) hours as needed for nausea. 20 tablet 0   . Polyethyl Glycol-Propyl Glycol (SYSTANE) 0.4-0.3 % SOLN Apply 2 drops to eye 3 (three) times daily as needed. For dry eyes    prn at prn  . senna (SENOKOT) 8.6 MG TABS tablet Take 2 tablets (17.2 mg total) by mouth at bedtime. 60 each 3   . simvastatin (ZOCOR) 10 MG tablet Take 5 mg by mouth at bedtime.     04/08/2011 at 1800   Scheduled: . aspirin EC  325 mg Oral Daily  . atorvastatin  40 mg Oral q1800  . calcium carbonate  500 mg of elemental calcium Oral Daily  . carbamide peroxide  5 drop Right EAR BID  . cholecalciferol  2,000 Units Oral Daily  . diazepam  2.5-5 mg Oral QHS  . docusate sodium  100 mg Oral BID  . enoxaparin (LOVENOX) injection  40 mg Subcutaneous Q24H  . losartan  25 mg Oral BID  . multivitamin with minerals  1 tablet Oral Daily  . nadolol  20 mg Oral Daily  . senna  2 tablet Oral QHS    ROS: History obtained from the patient  General ROS: negative for - chills, fatigue, fever, night sweats, weight gain or weight loss Psychological ROS: mild memory difficulties Ophthalmic ROS: negative for - blurry vision, double vision, eye pain or loss of vision ENT ROS:  negative for - epistaxis, nasal discharge, oral lesions, sore throat, tinnitus Allergy and Immunology ROS: negative for -  hives or itchy/watery eyes Hematological and Lymphatic ROS: negative for - bleeding problems, bruising or swollen lymph nodes Endocrine ROS: negative for - galactorrhea, hair pattern changes, polydipsia/polyuria or temperature intolerance Respiratory ROS: negative for - cough, hemoptysis, shortness of breath or wheezing Cardiovascular ROS: negative for - chest pain, dyspnea on exertion, edema or irregular heartbeat Gastrointestinal ROS: negative for - abdominal pain, diarrhea, hematemesis, nausea/vomiting or stool incontinence Genito-Urinary ROS: negative for - dysuria, hematuria, incontinence or urinary frequency/urgency Musculoskeletal ROS: negative for - joint swelling or muscular weakness Neurological ROS: as noted in HPI Dermatological ROS: negative for rash and skin lesion changes  Physical Examination: Blood pressure (!) 160/94, pulse 61, temperature 97.7 F (36.5 C), temperature source Oral, resp. rate 20, height 5' (1.524 m), weight 76.9 kg (169 lb 8 oz), SpO2 96 %.  HEENT-  Normocephalic, no lesions, without obvious abnormality.  Normal external eye and conjunctiva.  Normal TM's bilaterally.  Normal auditory canals and external ears. Normal external nose, mucus membranes and septum.  Normal pharynx. Cardiovascular- S1, S2 normal, pulses palpable throughout   Lungs- chest clear, no wheezing, rales, normal symmetric air entry Abdomen- soft, non-tender; bowel sounds normal; no masses,  no organomegaly Extremities- no edema Lymph-no adenopathy palpable Musculoskeletal-no joint tenderness, deformity or swelling Skin-warm and dry, no hyperpigmentation, vitiligo, or suspicious lesions  Neurological Examination   Mental Status: Alert, oriented, thought content appropriate.  Speech fluent with some mild word finding difficulties as times.  Able to follow 3 step  commands without difficulty. Cranial Nerves: II: Discs flat bilaterally; Visual fields grossly normal, pupils equal, round, reactive to light and accommodation III,IV, VI: ptosis not present, extra-ocular motions intact bilaterally V,VII: smile symmetric, facial light touch sensation normal bilaterally VIII: hearing normal bilaterally IX,X: gag reflex present XI: bilateral shoulder shrug XII: midline tongue extension Motor: Right : Upper extremity   5/5    Left:     Upper extremity   5/5  Lower extremity   5/5     Lower extremity   5/5 Tone and bulk:normal tone throughout; no atrophy noted Sensory: Pinprick and light touch intact throughout, bilaterally Deep Tendon Reflexes: 2+ and symmetric throughout Plantars: Right: downgoing   Left: downgoing Cerebellar: Normal finger-to-nose, normal rapid alternating movements and normal heel-to-shin testing bilaterally Gait: not tested due to safety concerns    Laboratory Studies:  Basic Metabolic Panel:  Recent Labs Lab 10/20/16 1246 10/21/16 0435  NA 140 141  K 3.8 3.3*  CL 104 107  CO2 28 28  GLUCOSE 102* 89  BUN 17 16  CREATININE 0.64 0.62  CALCIUM 8.9 8.7*    Liver Function Tests:  Recent Labs Lab 10/20/16 1246  AST 28  ALT 17  ALKPHOS 82  BILITOT 0.9  PROT 6.5  ALBUMIN 4.1   No results for input(s): LIPASE, AMYLASE in the last 168 hours. No results for input(s): AMMONIA in the last 168 hours.  CBC:  Recent Labs Lab 10/20/16 1246 10/21/16 0435  WBC 7.1 4.6  NEUTROABS 4.9  --   HGB 14.0 13.1  HCT 40.2 37.5  MCV 91.5 90.4  PLT 174 151    Cardiac Enzymes:  Recent Labs Lab 10/20/16 1246  TROPONINI <0.03    BNP: Invalid input(s): POCBNP  CBG:  Recent Labs Lab 10/20/16 1250  GLUCAP 59    Microbiology: Results for orders placed or performed during the hospital encounter of 05/04/15  Surgical pcr screen     Status: None   Collection Time: 05/04/15 10:40 AM  Result Value Ref Range Status    MRSA, PCR NEGATIVE NEGATIVE Final   Staphylococcus aureus NEGATIVE NEGATIVE Final    Comment:        The Xpert SA Assay (FDA approved for NASAL specimens in patients over 79 years of age), is one component of a comprehensive surveillance program.  Test performance has been validated by [facility] for patients greater than or equal to 39 year old. It is not intended to diagnose infection nor to guide or monitor treatment.     Coagulation Studies:  Recent Labs  10/20/16 1246  LABPROT 14.0  INR 1.08    Urinalysis:  Recent Labs Lab 10/20/16 1311  COLORURINE STRAW*  LABSPEC 1.005  PHURINE 7.0  GLUCOSEU NEGATIVE  HGBUR NEGATIVE  BILIRUBINUR NEGATIVE  KETONESUR NEGATIVE  PROTEINUR NEGATIVE  NITRITE NEGATIVE  LEUKOCYTESUR NEGATIVE    Lipid Panel:    Component Value Date/Time   CHOL 200 10/20/2016 1846   TRIG 94 10/20/2016 1846   HDL 50 10/20/2016 1846   CHOLHDL 4.0 10/20/2016 1846   VLDL 19 10/20/2016 1846   LDLCALC 131 (H) 10/20/2016 1846    HgbA1C: No results found for: HGBA1C  Urine Drug Screen:  No results found for: LABOPIA, COCAINSCRNUR, LABBENZ, AMPHETMU, THCU, LABBARB  Alcohol Level: No results for input(s): ETH in the last 168 hours.  Other results: EKG: normal sinus rhythm at 68 bpm.  Imaging: Ct Head Wo Contrast  Result Date: 10/20/2016 CLINICAL DATA:  83 year old hypertensive female with right-sided headache for the past 2 days. Difficulty getting words out. Initial encounter. EXAM: CT HEAD WITHOUT CONTRAST TECHNIQUE: Contiguous axial images were obtained from the base of the skull through the vertex without intravenous contrast. COMPARISON:  None. FINDINGS: Brain: No intracranial hemorrhage. Prominent chronic microvascular changes. Infarct right caudate head probably remote although age indeterminate. No CT evidence of large acute infarct. Age related mild atrophy without hydrocephalus. Top-normal size pituitary gland. No intracranial mass lesion  noted on this unenhanced exam. Vascular: Vascular calcifications. Skull: No skull fracture.  Hyperostosis frontalis interna. Sinuses/Orbits: No acute orbital abnormality post lens replacement. Visualized paranasal sinuses are clear. Other: Mastoid air cells middle ear cavities are clear. IMPRESSION: No skull fracture or intracranial hemorrhage. Prominent chronic microvascular changes. Infarct right caudate head probably remote although age indeterminate. No CT evidence of large acute infarct. Top-normal size pituitary gland. Electronically Signed   By: Genia Del M.D.   On: 10/20/2016 13:09   Mr Jodene Nam Head Wo Contrast  Result Date: 10/20/2016 CLINICAL DATA:  New onset right-sided headache and word-finding difficulty beginning this morning. Five falls over the last 1 month secondary to dizziness and right ear fullness. EXAM: MRI HEAD WITHOUT CONTRAST MRA HEAD WITHOUT CONTRAST TECHNIQUE: Multiplanar, multiecho pulse sequences of the brain and surrounding structures were obtained without intravenous contrast. Angiographic images of the head were obtained using MRA technique without contrast. COMPARISON:  CT head without contrast 10/20/2016. FINDINGS: MRI HEAD FINDINGS Brain: The diffusion-weighted images demonstrate no acute or subacute infarction. Confluent periventricular and subcortical white matter disease bilaterally is moderately advanced for age. Lacunar infarct present in the basal ganglia, asymmetric on the right. A remote right caudate head infarct is present. This accounts for the CT finding. White matter changes extend into the brainstem. The cerebellum is unremarkable. No acute hemorrhage or mass lesion is present. Internal auditory canals are within normal limits bilaterally. Vascular: Flow is present in the major intracranial arteries. Bilateral lens replacements are present. The globes and orbits are within  normal limits. Skull and upper cervical spine: The skullbase is within normal limits. The  craniocervical junction is normal. Midline sagittal structures are unremarkable. The upper cervical spine is within normal limits. Sinuses/Orbits: Mild mucosal thickening is present in the ethmoid air cells bilaterally. The paranasal sinuses are clear. Bilateral mastoid air cells are clear. MRA HEAD FINDINGS There is focal irregularity of the mid cervical right internal carotid artery with a 4 mm pseudoaneurysm. This may be related to prior dissection. More distal flow is within normal limits. Right A1 segment is not visualized. The anterior communicating artery is patent although narrowed. The left A1 segment is normal. There is mild narrowing of the right M1 segment without significant stenosis. The MCA bifurcation is intact bilaterally. Segmental stenoses of MCA branch vessels is worse right than left. The left vertebral artery is dominant. There is narrowing at the distal vertebral arteries bilaterally. The vertebrobasilar junction is intact. The right posterior cerebral artery originates from the basilar tip. The left posterior cerebral artery is of fetal type. There is marked attenuation of PCA branch vessels bilaterally. IMPRESSION: 1. No acute infarct. 2. Remote infarct involving the anterior right basal ganglia with multiple smaller lacunar infarcts in the basal ganglia bilaterally, right greater than left. 3. Dilated perivascular spaces bilaterally. 4. White matter changes extend into the brainstem. 5. Circle Willis demonstrates moderate diffuse small vessel disease with segmental narrowing and stenoses of the MCA and PCA branches bilaterally. 6. 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 Distal flow is intact. 7. Moderate narrowing of the distal vertebral arteries bilaterally is exaggerated by artifact. 8. Fetal type left posterior cerebral artery. 9. The right A1 segment is not visualized. The anterior communicating artery is patent with  mild narrowing. Electronically Signed   By: San Morelle M.D.   On: 10/20/2016 18:12   Mr Brain Wo Contrast  Result Date: 10/20/2016 CLINICAL DATA:  New onset right-sided headache and word-finding difficulty beginning this morning. Five falls over the last 1 month secondary to dizziness and right ear fullness. EXAM: MRI HEAD WITHOUT CONTRAST MRA HEAD WITHOUT CONTRAST TECHNIQUE: Multiplanar, multiecho pulse sequences of the brain and surrounding structures were obtained without intravenous contrast. Angiographic images of the head were obtained using MRA technique without contrast. COMPARISON:  CT head without contrast 10/20/2016. FINDINGS: MRI HEAD FINDINGS Brain: The diffusion-weighted images demonstrate no acute or subacute infarction. Confluent periventricular and subcortical white matter disease bilaterally is moderately advanced for age. Lacunar infarct present in the basal ganglia, asymmetric on the right. A remote right caudate head infarct is present. This accounts for the CT finding. White matter changes extend into the brainstem. The cerebellum is unremarkable. No acute hemorrhage or mass lesion is present. Internal auditory canals are within normal limits bilaterally. Vascular: Flow is present in the major intracranial arteries. Bilateral lens replacements are present. The globes and orbits are within normal limits. Skull and upper cervical spine: The skullbase is within normal limits. The craniocervical junction is normal. Midline sagittal structures are unremarkable. The upper cervical spine is within normal limits. Sinuses/Orbits: Mild mucosal thickening is present in the ethmoid air cells bilaterally. The paranasal sinuses are clear. Bilateral mastoid air cells are clear. MRA HEAD FINDINGS There is focal irregularity of the mid cervical right internal carotid artery with a 4 mm pseudoaneurysm. This may be related to prior dissection. More distal flow is within normal limits. Right A1  segment is not visualized. The anterior communicating artery is patent  although narrowed. The left A1 segment is normal. There is mild narrowing of the right M1 segment without significant stenosis. The MCA bifurcation is intact bilaterally. Segmental stenoses of MCA branch vessels is worse right than left. The left vertebral artery is dominant. There is narrowing at the distal vertebral arteries bilaterally. The vertebrobasilar junction is intact. The right posterior cerebral artery originates from the basilar tip. The left posterior cerebral artery is of fetal type. There is marked attenuation of PCA branch vessels bilaterally. IMPRESSION: 1. No acute infarct. 2. Remote infarct involving the anterior right basal ganglia with multiple smaller lacunar infarcts in the basal ganglia bilaterally, right greater than left. 3. Dilated perivascular spaces bilaterally. 4. White matter changes extend into the brainstem. 5. Circle Willis demonstrates moderate diffuse small vessel disease with segmental narrowing and stenoses of the MCA and PCA branches bilaterally. 6. 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 Distal flow is intact. 7. Moderate narrowing of the distal vertebral arteries bilaterally is exaggerated by artifact. 8. Fetal type left posterior cerebral artery. 9. The right A1 segment is not visualized. The anterior communicating artery is patent with mild narrowing. Electronically Signed   By: San Morelle M.D.   On: 10/20/2016 18:12   US Carotid Bilateral  Result Date: 10/20/2016 CLINICAL DATA:  Stroke symptoms, hypertension, syncope, visual disturbance, intermittent tobacco use EXAM: BILATERAL CAROTID DUPLEX ULTRASOUND TECHNIQUE: Pearline Cables scale imaging, color Doppler and duplex ultrasound were performed of bilateral carotid and vertebral arteries in the neck. COMPARISON:  10/20/2016 head CT FINDINGS: Criteria: Quantification of carotid  stenosis is based on velocity parameters that correlate the residual internal carotid diameter with NASCET-based stenosis levels, using the diameter of the distal internal carotid lumen as the denominator for stenosis measurement. The following velocity measurements were obtained: RIGHT ICA:  37/8 cm/sec CCA:  41/9 cm/sec SYSTOLIC ICA/CCA RATIO:  0.8 DIASTOLIC ICA/CCA RATIO:  1.2 ECA:  56 cm/sec LEFT ICA:  35/9 cm/sec CCA:  62/22 cm/sec SYSTOLIC ICA/CCA RATIO:  0.6 DIASTOLIC ICA/CCA RATIO:  0.8 ECA:  46 cm/sec RIGHT CAROTID ARTERY: Minor carotid intimal thickening and atherosclerotic plaque formation. No hemodynamically significant right ICA stenosis, velocity elevation, or turbulent flow. Degree of narrowing less than 50%. RIGHT VERTEBRAL ARTERY:  Antegrade LEFT CAROTID ARTERY: Similar scattered minor intimal thickening and atherosclerotic plaque formation. No hemodynamically significant left ICA stenosis, velocity elevation, or turbulent flow. LEFT VERTEBRAL ARTERY:  Antegrade IMPRESSION: Minor carotid atherosclerosis. No hemodynamically significant ICA stenosis. Degree of narrowing less than 50% bilaterally. Patent antegrade vertebral flow bilaterally Electronically Signed   By: Jerilynn Mages.  Shick M.D.   On: 10/20/2016 16:29    Assessment: 82 y.o. female presenting with aphasia that has improved significantly.  Patient on ASA at home.  MRI of the brain reviewed and shows no acute changes.  MRA shows no evidence of large vessel occlusion.  TIA likely.  Carotid dopplers show no evidence of hemodynamically significant stenosis.  Echocardiogram shows no cardiac source of emboli with an EF of 65-70%.  LDL 131.  Stroke Risk Factors - hyperlipidemia and hypertension  Plan: 1. HgbA1c 2. PT consult, OT consult, Speech consult 3. Prophylactic therapy-Antiplatelet med: Plavix - dose 75mg  daily 4. Telemetry monitoring 5. Frequent neuro checks 6. Aggressive lipid management with target LDL<70.    Alexis Goodell,  MD Neurology [phone] 10/21/2016, 12:08 PM

## 2016-10-21 NOTE — Progress Notes (Signed)
OT Cancellation Note  Patient Details Name: Chelsea Mcintyre MRN: [id number] DOB: [birth year]   Cancelled Treatment:    Reason Eval/Treat Not Completed: Other (comment). Neurology in with pt on 3rd attempt to evaluation. Will re-attempt this afternoon as pt is available.  Jeni Salles, MPH, MS, OTR/L ascom [phone] 10/21/16, 11:40 AM

## 2016-10-21 NOTE — Care Management Obs Status (Signed)
[facility] NOTIFICATION   Patient Details  Name: Chelsea Mcintyre MRN: [id number] Date of Birth: 02-18-1933   Medicare Observation Status Notification Given:  Yes    Shelbie Ammons, RN 10/21/2016, 8:55 AM

## 2016-10-24 NOTE — Discharge Summary (Signed)
Chelsea Mcintyre at [place] NAME: Chelsea Mcintyre    MR#:  [id number]  DATE OF BIRTH:  [birth year]  DATE OF ADMISSION:  10/20/2016 ADMITTING PHYSICIAN: Gladstone Lighter, MD  DATE OF DISCHARGE: 10/21/2016  5:13 PM  PRIMARY CARE PHYSICIAN: Raina Mina., MD   ADMISSION DIAGNOSIS:  Cerebral infarction ([place]) [I63.9] CVA (cerebral infarction) [I63.9] Cerebrovascular accident (CVA), unspecified mechanism ([place]) [I63.9]  DISCHARGE DIAGNOSIS:  Active Problems:   TIA (transient ischemic attack)   SECONDARY DIAGNOSIS:   Past Medical History:  Diagnosis Date  . Arthritis    bilateral hips and bilateral shoulders.  . Cancer ([place])    skin back, nose  . Complication of anesthesia    heart stopped with the anesthesia x1,  difficult to wake up  . Dysrhythmia    if taking certain medications  . Family history of adverse reaction to anesthesia    sister slow to awaken- "almost died last time"  . Frequency of urination   . Hypertension   . Jaundice    as a child  . MVP (mitral valve prolapse)   . Pneumonia 09   hx  . 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 Recurrent upper respiratory infection (URI)    Dx 1st of september with sinus infection, placed on antibiotics at that time     ADMITTING HISTORY  HISTORY OF PRESENT ILLNESS:  Chelsea Mcintyre  is a 83 y.o. female with a known history of Hypertension, arthritis, mitral valve prolapse, Presents to the hospital secondary to sudden onset of right-sided headache and word finding difficulty this morning. Patient is very independent and lives by herself. But has had about 5 falls in the last 1 month. Most of them she attributes to dizziness and right ear fullness. The last couple of days she has noticed that her speech was different. She was having trouble finding the right words. This morning her daughter-in-law was helping her clean the house, patient had sudden  worsening of right sided headache and also worsening of speech and trouble getting the words right. 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 She is being admitted for TIA workup. Her speech is much better at this time. Blood pressure was significantly elevated   HOSPITAL COURSE:   * TIA Patient's symptoms resolved by the time of discharge. MRI showed old CVA but nothing acute. Some stenosis and narrowing of small blood vessels. Seen by neurology Dr. Doy Mince. Echocardiogram and carotid Dopplers showed nothing acute. Patient's aspirin was switched to Plavix. Zocor switch to Lipitor. Patient will follow-up with outpatient neurology. Seen by physical therapy and speech. Home health set up at discharge. Walker provided. Advised to return to the emergency room if any reoccurrence of symptoms.  * Hypertension. Uncontrolled. Patient on prior blood pressure medications. Added hydrochlorothiazide.  Stable for discharge home.  CONSULTS OBTAINED:  Treatment Team:  Catarina Hartshorn, MD Alexis Goodell, MD  DRUG ALLERGIES:   Allergies  Allergen Reactions  . Sulfa Drugs Cross Reactors Shortness Of Breath and Rash  . Gluten Meal Nausea Only    Bothers her stomach   . Tape Other (See Comments)    Layer of skin peels off and area becomes raw  . Latex Hives  . Lisinopril Cough  . Amlodipine Rash    DISCHARGE MEDICATIONS:   Discharge Medication List as of 10/21/2016  4:55 PM    START taking these medications  Details  atorvastatin (LIPITOR) 40 MG tablet Take 1 tablet (40 mg total) by mouth daily at 6 PM., Starting Tue 10/21/2016, Normal    clopidogrel (PLAVIX) 75 MG tablet Take 1 tablet (75 mg total) by mouth daily., Starting Tue 10/21/2016, Until Wed 10/21/2017, Normal    hydrochlorothiazide (MICROZIDE) 12.5 MG capsule Take 1 capsule (12.5 mg total) by mouth daily., Starting Wed 10/22/2016, Normal      CONTINUE these medications which have NOT CHANGED   Details  cholecalciferol (VITAMIN D)  1000 UNITS tablet Take 2,000 Units by mouth daily.  , Until Discontinued, Historical Med    COD LIVER OIL PO Take 1 tablet by mouth daily., Historical Med    losartan (COZAAR) 25 MG tablet Take 25 mg by mouth 2 (two) times daily., Starting Sun 09/28/2016, Historical Med    Multiple Vitamins-Minerals (MULTIVITAMINS THER. W/MINERALS) TABS Take 1 tablet by mouth daily.  , Until Discontinued, Historical Med    nadolol (CORGARD) 20 MG tablet Take 5 mg by mouth 2 (two) times daily. , Until Discontinued, Historical Med    TURMERIC PO Take 1,000 mg by mouth daily., Historical Med    VITAMIN A PO Take 1 tablet by mouth daily., Historical Med    calcium carbonate (OS-CAL - DOSED IN MG OF ELEMENTAL CALCIUM) 1250 MG tablet Take 1 tablet by mouth daily.  , Until Discontinued, Historical Med    diazepam (VALIUM) 5 MG tablet Take 0.5-1 mg by mouth at bedtime., Starting Mon 09/01/2016, Historical Med    docusate sodium (COLACE) 100 MG capsule Take 1 capsule (100 mg total) by mouth 2 (two) times daily., Starting Wed 05/16/2015, Print    HYDROcodone-acetaminophen (NORCO) 5-325 MG tablet Take 1-2 tablets by mouth every 4 (four) hours as needed for moderate pain., Starting Wed 05/16/2015, Print    ibuprofen (ADVIL,MOTRIN) 200 MG tablet Take 400-600 mg by mouth at bedtime as needed. For pain, Historical Med    meclizine (ANTIVERT) 25 MG tablet Take 25 mg by mouth daily as needed. For dizzines , Until Discontinued, Historical Med    ondansetron (ZOFRAN) 4 MG tablet Take 1 tablet (4 mg total) by mouth every 6 (six) hours as needed for nausea., Starting Wed 05/16/2015, Print    Polyethyl Glycol-Propyl Glycol (SYSTANE) 0.4-0.3 % SOLN Apply 2 drops to eye 3 (three) times daily as needed. For dry eyes , Until Discontinued, Historical Med    senna (SENOKOT) 8.6 MG TABS tablet Take 2 tablets (17.2 mg total) by mouth at bedtime., Starting Wed 05/16/2015, Print      STOP taking these medications     aspirin EC 81  MG tablet      aspirin EC 325 MG tablet      simvastatin (ZOCOR) 10 MG tablet         Today   VITAL SIGNS:  Blood pressure (!) 156/76, pulse 63, temperature 98.5 F (36.9 C), temperature source Oral, resp. rate 18, height 5' (1.524 m), weight 76.9 kg (169 lb 8 oz), SpO2 99 %.  I/O:  No intake or output data in the 24 hours ending 10/24/16 1339  PHYSICAL EXAMINATION:  Physical Exam  GENERAL:  83 y.o.-year-old patient lying in the bed with no acute distress.  LUNGS: Normal breath sounds bilaterally, no wheezing, rales,rhonchi or crepitation. No use of accessory muscles of respiration.  CARDIOVASCULAR: S1, S2 normal. No murmurs, rubs, or gallops.  ABDOMEN: Soft, non-tender, non-distended. Bowel sounds present. No organomegaly or mass.  NEUROLOGIC: Moves all 4 extremities. PSYCHIATRIC: The  patient is alert and oriented x 3.  SKIN: No obvious rash, lesion, or ulcer.   DATA REVIEW:   CBC  Recent Labs Lab 10/21/16 0435  WBC 4.6  HGB 13.1  HCT 37.5  PLT 151    Chemistries   Recent Labs Lab 10/20/16 1246 10/21/16 0435  NA 140 141  K 3.8 3.3*  CL 104 107  CO2 28 28  GLUCOSE 102* 89  BUN 17 16  CREATININE 0.64 0.62  CALCIUM 8.9 8.7*  AST 28  --   ALT 17  --   ALKPHOS 82  --   BILITOT 0.9  --     Cardiac Enzymes  Recent Labs Lab 10/20/16 1246  TROPONINI <0.03    Microbiology Results  Results for orders placed or performed during the hospital encounter of 05/04/15  Surgical pcr screen     Status: None   Collection Time: 05/04/15 10:40 AM  Result Value Ref Range Status   MRSA, PCR NEGATIVE NEGATIVE Final   Staphylococcus aureus NEGATIVE NEGATIVE Final    Comment:        The Xpert SA Assay (FDA approved for NASAL specimens in patients over 44 years of age), is one component of a comprehensive surveillance program.  Test performance has been validated by [facility] for patients greater than or equal to 70 year old. It is not intended to diagnose  infection nor to guide or monitor treatment.     RADIOLOGY:  No results found.  Follow up with PCP in 1 week.  Management plans discussed with the patient, family and they are in agreement.  CODE STATUS:  Code Status History    Date Active Date Inactive Code Status Order ID Comments User Context   10/20/2016  5:27 PM 10/21/2016  8:18 PM Full Code [id number]  Gladstone Lighter, MD ED   05/15/2015  6:37 PM 05/17/2015  1:42 PM Full Code [id number]  Rod Can, MD Inpatient      TOTAL TIME TAKING CARE OF THIS PATIENT ON DAY OF DISCHARGE: more than 30 minutes.   Hillary Bow R M.D on 10/24/2016 at 1:39 PM  Between 7am to 6pm - Pager - [phone]  After 6pm go to [email or web address] - password [email or web address] [facility]  Office  [phone]  CC: Primary care physician; Raina Mina., MD  Note: This dictation was prepared with Dragon dictation along with smaller phrase technology. Any transcriptional errors that result from this process are unintentional.

## 2016-11-26 DIAGNOSIS — Z8673 Personal history of transient ischemic attack (TIA), and cerebral infarction without residual deficits: Secondary | ICD-10-CM | POA: Insufficient documentation

## 2016-11-26 HISTORY — DX: Personal history of transient ischemic attack (TIA), and cerebral infarction without residual deficits: Z86.73

## 2016-12-03 IMAGING — RF DG HIP (WITH PELVIS) OPERATIVE*L*
1 series · 3 of 3 positions shown · non-contrast
Comparison: None.

CLINICAL DATA: Left total hip arthroplasty, anterior approach.

EXAM:
OPERATIVE LEFT HIP (WITH PELVIS IF PERFORMED) 1 VIEWS
TECHNIQUE: Fluoroscopic spot image(s) were submitted for interpretation
post-operatively.

[Series 1: run · 3 of 3 slices shown]
[im 1/3]
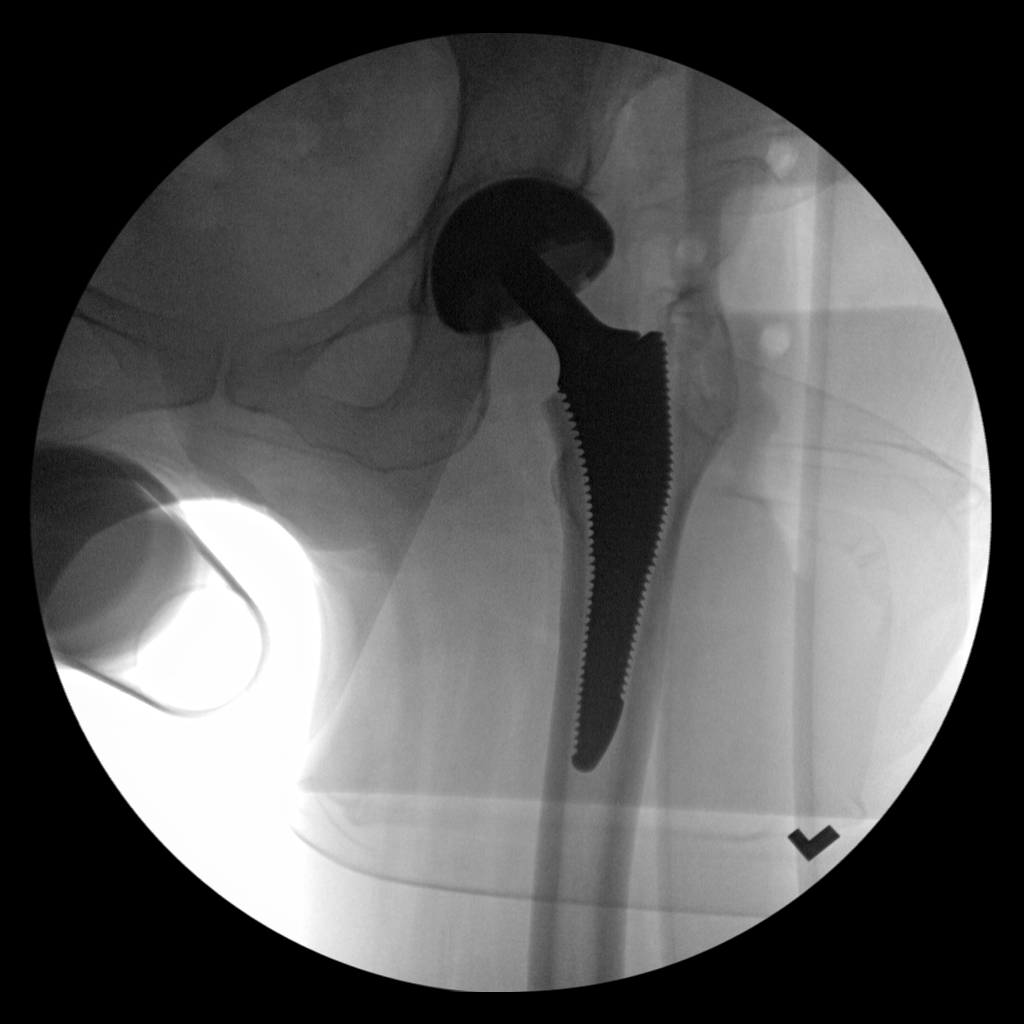
[im 2/3]
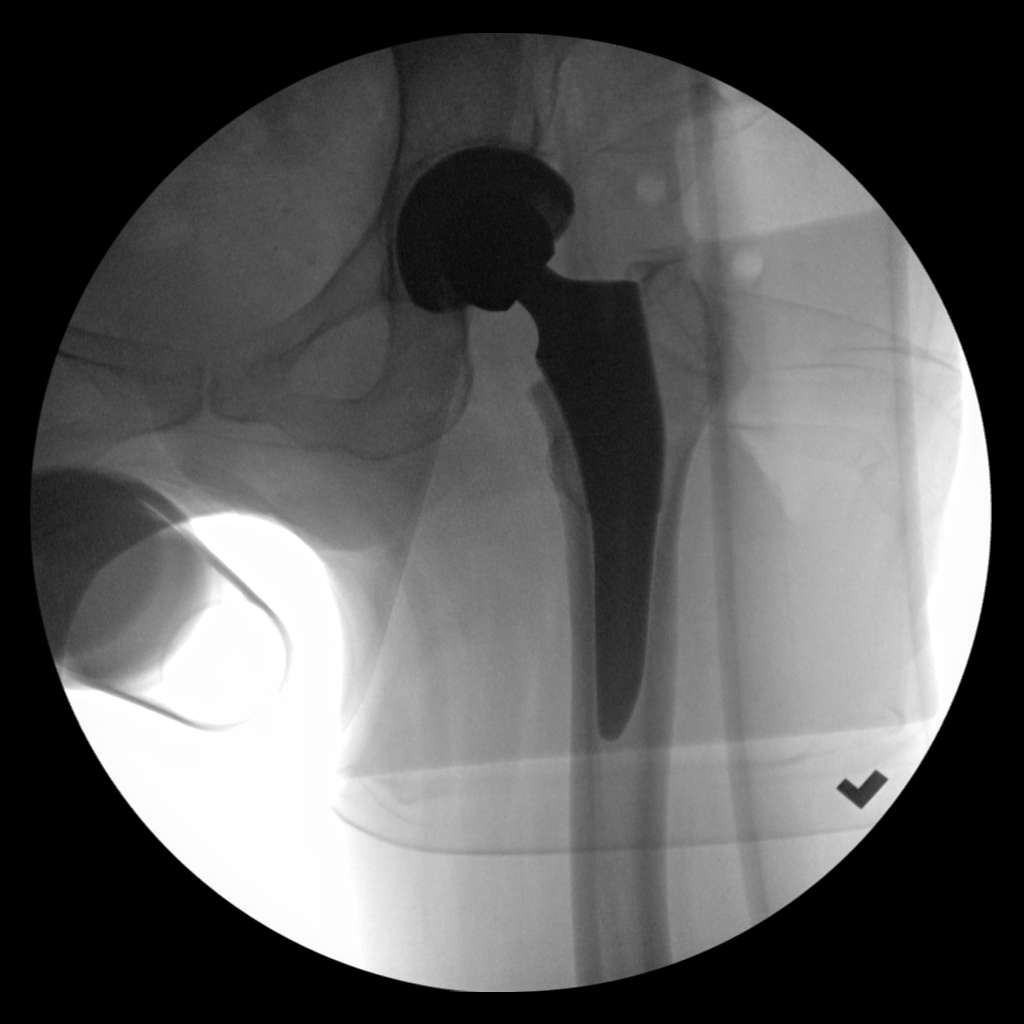
[im 3/3]
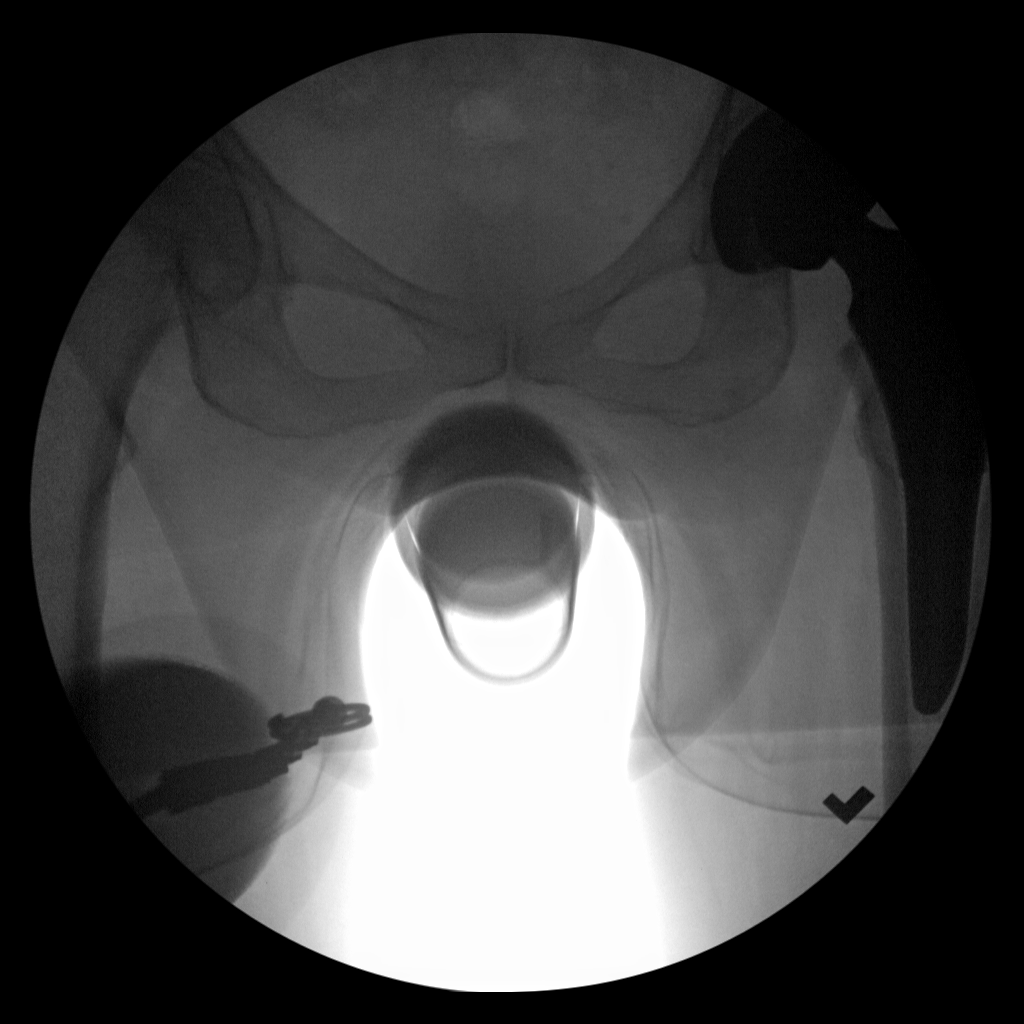

[3 of 3 positions shown; findings below may reference images not displayed]

FINDINGS: AP spot images of the left hip and lower pelvis demonstrate anatomic
alignment of the left hip arthroplasty without acute complicating
features.

The radiologic technologist documented 24 sec of fluoroscopy time.
IMPRESSION: Anatomic alignment post left total hip arthroplasty without acute
complicating features.

## 2017-04-23 DIAGNOSIS — J301 Allergic rhinitis due to pollen: Secondary | ICD-10-CM

## 2017-04-23 DIAGNOSIS — N3941 Urge incontinence: Secondary | ICD-10-CM | POA: Insufficient documentation

## 2017-04-23 HISTORY — DX: Allergic rhinitis due to pollen: J30.1

## 2017-04-23 HISTORY — DX: Urge incontinence: N39.41

## 2018-03-22 DIAGNOSIS — G8929 Other chronic pain: Secondary | ICD-10-CM | POA: Insufficient documentation

## 2018-03-22 HISTORY — DX: Other chronic pain: G89.29

## 2018-04-01 DIAGNOSIS — M25511 Pain in right shoulder: Secondary | ICD-10-CM

## 2018-04-01 HISTORY — DX: Pain in right shoulder: M25.511

## 2018-06-11 DIAGNOSIS — M19011 Primary osteoarthritis, right shoulder: Secondary | ICD-10-CM

## 2018-06-11 HISTORY — DX: Primary osteoarthritis, right shoulder: M19.011

## 2018-07-26 NOTE — Progress Notes (Addendum)
  07-29-18 Surgical Clearance from Kathrynn Ducking, FNP on chart  07-28-18 Cardiac Clearance from Dr. Luisa Dago on chart  12-10 -19 Stress Test on chart  03-03-18 EKG on chart  10-21-16 (Epic) ECHO

## 2018-07-26 NOTE — Patient Instructions (Signed)
Chelsea Mcintyre  07/26/2018   Your procedure is scheduled on: 08-05-18    Report to [facility] Main  Entrance    Report to Short Stay at 5:30 AM    Call this number if you have problems the morning of surgery [phone]    Remember: Do not eat food or drink liquids :After Midnight.     BRUSH YOUR TEETH MORNING OF SURGERY AND RINSE YOUR MOUTH OUT, NO CHEWING GUM CANDY OR MINTS.     Take these medicines the morning of surgery with A SIP OF WATER: Ezetimibe (Zetia), and Nadolol (Coreg). You may also bring and use your eyedrops as needed.                               You may not have any metal on your body including hair pins and              piercings  Do not wear jewelry, make-up, lotions, powders or perfumes, deodorant             Do not wear nail polish.  Do not shave  48 hours prior to surgery.               Do not bring valuables to the hospital. [place].  Contacts, dentures or bridgework may not be worn into surgery.  Leave suitcase in the car. After surgery it may be brought to your room.     Patients discharged the day of surgery will not be allowed to drive home. IF YOU ARE HAVING SURGERY AND GOING HOME THE SAME DAY, YOU MUST HAVE AN ADULT TO DRIVE YOU HOME AND BE WITH YOU FOR 24 HOURS. YOU MAY GO HOME BY TAXI OR UBER OR ORTHERWISE, BUT AN ADULT MUST ACCOMPANY YOU HOME AND STAY WITH YOU FOR 24 HOURS.    Special Instructions: N/A              Please read over the following fact sheets you were given: _____________________________________________________________________             [facility] - Preparing for Surgery Before surgery, you can play an important role.  Because skin is not sterile, your skin needs to be as free of germs as possible.  You can reduce the number of germs on your skin by washing with CHG (chlorahexidine gluconate) soap before surgery.  CHG is an antiseptic cleaner which  kills germs and bonds with the skin to continue killing germs even after washing. Please DO NOT use if you have an allergy to CHG or antibacterial soaps.  If your skin becomes reddened/irritated stop using the CHG and inform your nurse when you arrive at Short Stay. Do not shave (including legs and underarms) for at least 48 hours prior to the first CHG shower.  You may shave your face/neck. Please follow these instructions carefully:  1.  Shower with CHG Soap the night before surgery and the  morning of Surgery.  2.  If you choose to wash your hair, wash your hair first as usual with your  normal  shampoo.  3.  After you shampoo, rinse your hair and body thoroughly to remove the  shampoo.  4.  Use CHG as you would any other liquid soap.  You can apply chg directly  to the skin and wash                       Gently with a scrungie or clean washcloth.  5.  Apply the CHG Soap to your body ONLY FROM THE NECK DOWN.   Do not use on face/ open                           Wound or open sores. Avoid contact with eyes, ears mouth and genitals (private parts).                       Wash face,  Genitals (private parts) with your normal soap.             6.  Wash thoroughly, paying special attention to the area where your surgery  will be performed.  7.  Thoroughly rinse your body with warm water from the neck down.  8.  DO NOT shower/wash with your normal soap after using and rinsing off  the CHG Soap.                9.  Pat yourself dry with a clean towel.            10.  Wear clean pajamas.            11.  Place clean sheets on your bed the night of your first shower and do not  sleep with pets. Day of Surgery : Do not apply any lotions/deodorants the morning of surgery.  Please wear clean clothes to the hospital/surgery center.  FAILURE TO FOLLOW THESE INSTRUCTIONS MAY RESULT IN THE CANCELLATION OF YOUR SURGERY PATIENT SIGNATURE_________________________________  NURSE  SIGNATURE__________________________________  ________________________________________________________________________   [name]  An incentive spirometer is a tool that can help keep your lungs clear and active. This tool measures how well you are filling your lungs with each breath. Taking long deep breaths may help reverse or decrease the chance of developing breathing (pulmonary) problems (especially infection) following:  A long period of time when you are unable to move or be active. BEFORE THE PROCEDURE   If the spirometer includes an indicator to show your best effort, your nurse or respiratory therapist will set it to a desired goal.  If possible, sit up straight or lean slightly forward. Try not to slouch.  Hold the incentive spirometer in an upright position. INSTRUCTIONS FOR USE  1. Sit on the edge of your bed if possible, or sit up as far as you can in bed or on a chair. 2. Hold the incentive spirometer in an upright position. 3. Breathe out normally. 4. Place the mouthpiece in your mouth and seal your lips tightly around it. 5. Breathe in slowly and as deeply as possible, raising the piston or the ball toward the top of the column. 6. Hold your breath for 3-5 seconds or for as long as possible. Allow the piston or ball to fall to the bottom of the column. 7. Remove the mouthpiece from your mouth and breathe out normally. 8. Rest for a few seconds and repeat Steps 1 through 7 at least 10 times every 1-2 hours when you are awake. Take your time and take a few normal breaths between deep breaths. 9. The spirometer may include an indicator to show  your best effort. Use the indicator as a goal to work toward during each repetition. 10. After each set of 10 deep breaths, practice coughing to be sure your lungs are clear. If you have an incision (the cut made at the time of surgery), support your incision when coughing by placing a pillow or rolled up towels firmly  against it. Once you are able to get out of bed, walk around indoors and cough well. You may stop using the incentive spirometer when instructed by your caregiver.  RISKS AND COMPLICATIONS  Take your time so you do not get dizzy or light-headed.  If you are in pain, you may need to take or ask for pain medication before doing incentive spirometry. It is harder to take a deep breath if you are having pain. AFTER USE  Rest and breathe slowly and easily.  It can be helpful to keep track of a log of your progress. Your caregiver can provide you with a simple table to help with this. If you are using the spirometer at home, follow these instructions: [place] IF:   You are having difficultly using the spirometer.  You have trouble using the spirometer as often as instructed.  Your pain medication is not giving enough relief while using the spirometer.  You develop fever of 100.5 F (38.1 C) or higher. SEEK IMMEDIATE MEDICAL CARE IF:   You cough up bloody sputum that had not been present before.  You develop fever of 102 F (38.9 C) or greater.  You develop worsening pain at or near the incision site. MAKE SURE YOU:   Understand these instructions.  Will watch your condition.  Will get help right away if you are not doing well or get worse. Document Released: 09/15/2006 Document Revised: 07/28/2011 Document Reviewed: 11/16/2006 [facility] Patient Information 2014 [place], [place].   ________________________________________________________________________

## 2018-07-28 ENCOUNTER — Encounter (HOSPITAL_COMMUNITY): Payer: Self-pay | Admitting: Physician Assistant

## 2018-07-28 ENCOUNTER — Encounter (HOSPITAL_COMMUNITY): Payer: Self-pay

## 2018-07-28 ENCOUNTER — Encounter (INDEPENDENT_AMBULATORY_CARE_PROVIDER_SITE_OTHER): Payer: Self-pay

## 2018-07-28 ENCOUNTER — Other Ambulatory Visit: Payer: Self-pay

## 2018-07-28 ENCOUNTER — Encounter (HOSPITAL_COMMUNITY)
Admission: RE | Admit: 2018-07-28 | Discharge: 2018-07-28 | Disposition: A | Discharge status: Home / Self Care | Payer: Medicare Other | Source: Ambulatory Visit | Attending: Orthopedic Surgery | Admitting: Orthopedic Surgery

## 2018-07-28 DIAGNOSIS — M19011 Primary osteoarthritis, right shoulder: Secondary | ICD-10-CM | POA: Diagnosis not present

## 2018-07-28 DIAGNOSIS — I10 Essential (primary) hypertension: Secondary | ICD-10-CM | POA: Insufficient documentation

## 2018-07-28 DIAGNOSIS — Z01818 Encounter for other preprocedural examination: Secondary | ICD-10-CM | POA: Diagnosis present

## 2018-07-28 LAB — BASIC METABOLIC PANEL
Anion gap: 9 (ref 5–15)
BUN: 18 mg/dL (ref 8–23)
CO2: 29 mmol/L (ref 22–32)
CREATININE: 0.65 mg/dL (ref 0.44–1.00)
Calcium: 9.5 mg/dL (ref 8.9–10.3)
Chloride: 101 mmol/L (ref 98–111)
GFR calc Af Amer: >60 mL/min (ref >60)
GFR calc non Af Amer: >60 mL/min (ref >60)
Glucose, Bld: 97 mg/dL (ref 70–99)
Potassium: 3 mmol/L — ABNORMAL LOW (ref 3.5–5.1)
Sodium: 139 mmol/L (ref 135–145)

## 2018-07-28 LAB — CBC
HEMATOCRIT: 42.3 % (ref 36.0–46.0)
Hemoglobin: 13.9 g/dL (ref 12.0–15.0)
MCH: 30.8 pg (ref 26.0–34.0)
MCHC: 32.9 g/dL (ref 30.0–36.0)
MCV: 93.8 fL (ref 80.0–100.0)
Platelets: 195 10*3/uL (ref 150–400)
RBC: 4.51 MIL/uL (ref 3.87–5.11)
RDW: 11.9 % (ref 11.5–15.5)
WBC: 4.6 10*3/uL (ref 4.0–10.5)
nRBC: 0 % (ref 0.0–0.2)

## 2018-07-28 LAB — SURGICAL PCR SCREEN
MRSA, PCR: NEGATIVE
Staphylococcus aureus: NEGATIVE

## 2018-07-28 NOTE — Progress Notes (Addendum)
Spoke to [facility] regarding Cardiac Clearance and EKG tracing. Dawn indicated that she would forward  request to nursing staff to handle... Also left message for Glendale Chard at [facility] to verify if Dr. Onnie Chelsea Mcintyre have a copy of clearance on file. Awaiting call back response or fax copies from [facility] and/or Glendale Chard.

## 2018-07-28 NOTE — Progress Notes (Addendum)
PCP: Dr. Gilford Rile  CARDIOLOGIST:Dr. Atilano Median  INFO IN Epic: 10-21-16 ECHO  INFO ON CHART:   -12-20 Surgical Clearance from Kathrynn Ducking, FNP on chart  07-28-18 Cardiac Clearance from Dr. Luisa Dago on chart  12-10 -19 Stress Test on chart  03-03-18 EKG on chart   BLOOD THINNERS AND LAST DOSES: Plavix (Pt plan to hold beginning on 07/31/18) ____________________________________  PATIENT SYMPTOMS AT TIME OF PREOP:  Hx of CVA, TIA, HTN, and Pseudoaneurysm per 04-05-18 office note from Cardiologist.

## 2018-08-02 NOTE — Progress Notes (Signed)
Anesthesia Chart Review   Case:  [id number] Date/Time:  08/05/18 0715   Procedure:  REVERSE SHOULDER ARTHROPLASTY (Right ) - 155min   Anesthesia type:  General   Pre-op diagnosis:  right shoulder osteoarthritis   Location:  [facility] ROOM 06 / [facility]   Surgeon:  Justice Britain, MD      DISCUSSION: 85 yo former smoker (1 pack years) with h/o MVP, HTN, right shoulder OA scheduled for above procedure 08/05/18 with Dr. Justice Britain.   Clearance received from PCP, Dr. Gilford Rile, 07/29/18 which states pt is cleared for surgery with the following recommendations to hold Plavix 5 days prior to surgery.    Clearance received from cardiologist, Dr. Gabrielle Dare, 07/28/18 which states, "Chelsea Mcintyre has been seen in our medical office on 05-05-2018.  She had a normal lexiscan stress test and is cleared from a cardiac standpoint for the proposed surgery."  Pt can proceed with planned procedure barring acute status change.  VS: BP 123/63   Pulse 62   Temp 36.6 C (Oral)   Resp 16   Ht 5\' 5"  (1.651 m)   Wt 70.3 kg   SpO2 99%   BMI 25.79 kg/m   PROVIDERS: Raina Mina., MD is PCP   Atilano Median, Gayleen Orem, MD is Cardiologist  LABS: Labs reviewed: Acceptable for surgery. (all labs ordered are listed, but only abnormal results are displayed)  Labs Reviewed  BASIC METABOLIC PANEL - Abnormal; Notable for the following components:      Result Value   Potassium 3.0 (*)    All other components within normal limits  SURGICAL PCR SCREEN  CBC     IMAGES: Carotid Doppler 10/20/16 RIGHT CAROTID ARTERY: Minor carotid intimal thickening and atherosclerotic plaque formation. No hemodynamically significant right ICA stenosis, velocity elevation, or turbulent flow. Degree of narrowing less than 50%. RIGHT VERTEBRAL ARTERY:  Antegrade LEFT CAROTID ARTERY: Similar scattered minor intimal thickening and atherosclerotic plaque formation. No hemodynamically significant left ICA stenosis, velocity  elevation, or turbulent flow. LEFT VERTEBRAL ARTERY:  Antegrade IMPRESSION: Minor carotid atherosclerosis. No hemodynamically significant ICA stenosis. Degree of narrowing less than 50% bilaterally.  EKG: 03/03/2018 Rate 65 bpm Normal sinus rhythm    CV: Stress Test 04/27/18 Impressions 1. Pharmacologic stress nuclear study is normal 2. No scar or ischemia detected by nuclear imaging.  The gated study showed the ejection fraction was 55-60%.   Echo 10/21/16 Study Conclusions  - Left ventricle: The cavity size was normal. Wall thickness was   increased in a pattern of mild LVH. There was focal basal   hypertrophy. Systolic function was vigorous. The estimated   ejection fraction was in the range of 65% to 70%. Features are   consistent with a pseudonormal left ventricular filling pattern,   with concomitant abnormal relaxation and increased filling   pressure (grade 2 diastolic dysfunction). Doppler parameters are   consistent with high ventricular filling pressure. - Mitral valve: Calcified annulus. Mildly thickened leaflets . - Right ventricle: The cavity size was normal. Wall thickness was   normal. Systolic function was normal. - Atrial septum: Doppler and agitated saline contrast study showed   no atrial level shunt, in the baseline state. - Pericardium, extracardiac: A trivial pericardial effusion was   identified posterior to the heart.  Past Medical History:  Diagnosis Date  . Arthritis    bilateral hips and bilateral shoulders.  . Cancer ([place])    skin back, nose  . Complication of anesthesia  heart stopped with the anesthesia x1,  difficult to wake up  . Dysrhythmia    if taking certain medications  . Family history of adverse reaction to anesthesia    sister slow to awaken- "almost died last time"  . Frequency of urination   . Hypertension   . Jaundice    as a child  . MVP (mitral valve prolapse)   . Pneumonia 09   hx  . 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 Recurrent upper respiratory infection (URI)    Dx 1st of september with sinus infection, placed on antibiotics at that time    Past Surgical History:  Procedure Laterality Date  . ABDOMINAL HYSTERECTOMY  75   partial  . APPENDECTOMY    . BACK SURGERY  2012   kyphoplasty  . BREAST SURGERY     1982,70rt  . CARDIAC CATHETERIZATION     1994, detected MVP  . ELBOW FRACTURE SURGERY Left 98,   hardware removed later in year  . EYE SURGERY     bilateral cataract surgery 2007 with lens implant  . fibroid cyst     ovary right removed 1966  . fibroid tumor     1970, right breast removed  . FRACTURE SURGERY     1988 shattered elbow left  . HARDWARE REMOVAL     left elbow 1998  . TONSILLECTOMY  44   and adenoids  . TOTAL HIP ARTHROPLASTY Left 05/15/2015   Procedure: LEFT TOTAL HIP ARTHROPLASTY ANTERIOR APPROACH;  Surgeon: Rod Can, MD;  Location: [facility];  Service: Orthopedics;  Laterality: Left;    MEDICATIONS: . Ascorbic Acid (VITAMIN C PO)  . atorvastatin (LIPITOR) 40 MG tablet  . calcium carbonate (OS-CAL - DOSED IN MG OF ELEMENTAL CALCIUM) 1250 MG tablet  . cholecalciferol (VITAMIN D) 1000 UNITS tablet  . clopidogrel (PLAVIX) 75 MG tablet  . COD LIVER OIL PO  . docusate sodium (COLACE) 100 MG capsule  . ezetimibe (ZETIA) 10 MG tablet  . hydrochlorothiazide (MICROZIDE) 12.5 MG capsule  . HYDROcodone-acetaminophen (NORCO) 5-325 MG tablet  . losartan (COZAAR) 25 MG tablet  . Multiple Vitamins-Minerals (MULTIVITAMINS THER. W/MINERALS) TABS  . nadolol (CORGARD) 20 MG tablet  . ondansetron (ZOFRAN) 4 MG tablet  . Polyethyl Glycol-Propyl Glycol (SYSTANE) 0.4-0.3 % SOLN  . senna (SENOKOT) 8.6 MG TABS tablet  . TURMERIC PO  . VITAMIN A PO   No current facility-administered medications for this encounter.     Maia Plan [facility] Pre-Surgical Testing [phone] 08/03/18 2:57 PM

## 2018-08-03 NOTE — Anesthesia Preprocedure Evaluation (Deleted)
Anesthesia Evaluation    Airway        Dental   Pulmonary former smoker,           Cardiovascular hypertension,      Neuro/Psych    GI/Hepatic   Endo/Other    Renal/GU      Musculoskeletal   Abdominal   Peds  Hematology   Anesthesia Other Findings   Reproductive/Obstetrics                             Anesthesia Physical Anesthesia Plan  ASA:   Anesthesia Plan:    Post-op Pain Management:    Induction:   PONV Risk Score and Plan:   Airway Management Planned:   Additional Equipment:   Intra-op Plan:   Post-operative Plan:   Informed Consent:   Plan Discussed with:   Anesthesia Plan Comments: (See PAT note 07/28/18, Konrad Felix, PA-C)        Anesthesia Quick Evaluation

## 2018-08-05 ENCOUNTER — Inpatient Hospital Stay (HOSPITAL_COMMUNITY): Admission: RE | Admit: 2018-08-05 | Payer: Medicare Other | Source: Ambulatory Visit | Admitting: Orthopedic Surgery

## 2018-08-05 ENCOUNTER — Encounter (HOSPITAL_COMMUNITY): Admission: RE | Payer: Self-pay | Source: Ambulatory Visit

## 2018-08-05 SURGERY — ARTHROPLASTY, SHOULDER, TOTAL, REVERSE
Anesthesia: General | Laterality: Right

## 2018-10-20 NOTE — Progress Notes (Addendum)
   07/28/2018- see progress note from Iver Nestle, [place] as she mentions a Clearance note from Dr. Gabrielle Dare from 07/28/2018 on chart from previous pre-op appointment in March.It also discusses about when to hold her PLAVIX.  03/03/2018- EKG from [facility] on chart  10/21/2016- noted in [place]

## 2018-10-20 NOTE — Patient Instructions (Addendum)
Chelsea Mcintyre    Your procedure is scheduled on: Thursday 10/28/2018  Report to [facility] Main  Entrance              Report to Short Stay at  [place] 19 TEST ON_6/8______ @__10 :20_____, THIS TEST MUST BE DONE BEFORE SURGERY, COME TO [place].    Call this number if you have problems the morning of surgery [phone]    Remember: Do not eat food  :After Midnight.              NO SOLID FOOD AFTER MIDNIGHT THE NIGHT PRIOR TO SURGERY. NOTHING BY MOUTH EXCEPT CLEAR LIQUIDS UNTIL 0430 am.             PLEASE FINISH ENSURE DRINK PER SURGEON ORDER  WHICH NEEDS TO BE COMPLETED AT  0430 am.   CLEAR LIQUID DIET   Foods Allowed                                                                     Foods Excluded  Coffee and tea, regular and decaf                             liquids that you cannot  Plain Jell-O in any flavor                                             see through such as: Fruit ices (not with fruit pulp)                                     milk, soups, orange juice  Iced Popsicles                                    All solid food Carbonated beverages, regular and diet                                    Cranberry, grape and apple juices Sports drinks like Gatorade Lightly seasoned clear broth or consume(fat free) Sugar, honey syrup  Sample Menu Breakfast                                Lunch                                     Supper Cranberry juice                    Beef broth  Chicken broth Jell-O                                     Grape juice                           Apple juice Coffee or tea                        Jell-O                                      Popsicle                                                Coffee or tea                        Coffee or  tea  _____________________________________________________________________  BRUSH YOUR TEETH MORNING OF SURGERY AND RINSE YOUR MOUTH OUT, NO CHEWING GUM CANDY OR MINTS.     Take these medicines the morning of surgery with A SIP OF WATER: Nadolol ([place]orgard)                               You may not have any metal on your body including hair pins and              piercings  Do not wear jewelry, make-up, lotions, powders or perfumes, deodorant             Do not wear nail polish.  Do not shave  48 hours prior to surgery.          Do not bring valuables to the hospital. [place].  Contacts, dentures or bridgework may not be worn into surgery.  Leave suitcase in the car. After surgery it may be brought to your room.                  Please read over the following fact sheets you were given: _____________________________________________________________________             [facility] - Preparing for Surgery Before surgery, you can play an important role.  Because skin is not sterile, your skin needs to be as free of germs as possible.  You can reduce the number of germs on your skin by washing with CHG (chlorahexidine gluconate) soap before surgery.  CHG is an antiseptic cleaner which kills germs and bonds with the skin to continue killing germs even after washing. Please DO NOT use if you have an allergy to CHG or antibacterial soaps.  If your skin becomes reddened/irritated stop using the CHG and inform your nurse when you arrive at Short Stay. Do not shave (including legs and underarms) for at least 48 hours prior to the first CHG shower.  You may shave your face/neck. Please follow these instructions carefully:  1.  Shower with CHG Soap the night before surgery and the  morning of Surgery.  2.  If you choose to wash your hair, wash your hair first as usual with your  normal  shampoo.  3.  After you shampoo, rinse your hair and body  thoroughly to remove the  shampoo.                           4.  Use CHG as you would any other liquid soap.  You can apply chg directly  to the skin and wash                       Gently with a scrungie or clean washcloth.  5.  Apply the CHG Soap to your body ONLY FROM THE NECK DOWN.   Do not use on face/ open                           Wound or open sores. Avoid contact with eyes, ears mouth and genitals (private parts).                       Wash face,  Genitals (private parts) with your normal soap.             6.  Wash thoroughly, paying special attention to the area where your surgery  will be performed.  7.  Thoroughly rinse your body with warm water from the neck down.  8.  DO NOT shower/wash with your normal soap after using and rinsing off  the CHG Soap.                9.  Pat yourself dry with a clean towel.            10.  Wear clean pajamas.            11.  Place clean sheets on your bed the night of your first shower and do not  sleep with pets. Day of Surgery : Do not apply any lotions/deodorants the morning of surgery.  Please wear clean clothes to the hospital/surgery center.  FAILURE TO FOLLOW THESE INSTRUCTIONS MAY RESULT IN THE CANCELLATION OF YOUR SURGERY PATIENT SIGNATURE_________________________________  NURSE SIGNATURE__________________________________  ________________________________________________________________________   Chelsea Mcintyre  An incentive spirometer is a tool that can help keep your lungs clear and active. This tool measures how well you are filling your lungs with each breath. Taking long deep breaths may help reverse or decrease the chance of developing breathing (pulmonary) problems (especially infection) following:  A long period of time when you are unable to move or be active. BEFORE THE PROCEDURE   If the spirometer includes an indicator to show your best effort, your nurse or respiratory therapist will set it to a desired goal.  If  possible, sit up straight or lean slightly forward. Try not to slouch.  Hold the incentive spirometer in an upright position. INSTRUCTIONS FOR USE  1. Sit on the edge of your bed if possible, or sit up as far as you can in bed or on a chair. 2. Hold the incentive spirometer in an upright position. 3. Breathe out normally. 4. Place the mouthpiece in your mouth and seal your lips tightly around it. 5. Breathe in slowly and as deeply as possible, raising the piston or the ball toward the top of the column. 6. Hold your breath for 3-5 seconds or for as long as possible. Allow the piston  or ball to fall to the bottom of the column. 7. Remove the mouthpiece from your mouth and breathe out normally. 8. Rest for a few seconds and repeat Steps 1 through 7 at least 10 times every 1-2 hours when you are awake. Take your time and take a few normal breaths between deep breaths. 9. The spirometer may include an indicator to show your best effort. Use the indicator as a goal to work toward during each repetition. 10. After each set of 10 deep breaths, practice coughing to be sure your lungs are clear. If you have an incision (the cut made at the time of surgery), support your incision when coughing by placing a pillow or rolled up towels firmly against it. Once you are able to get out of bed, walk around indoors and cough well. You may stop using the incentive spirometer when instructed by your caregiver.  RISKS AND COMPLICATIONS  Take your time so you do not get dizzy or light-headed.  If you are in pain, you may need to take or ask for pain medication before doing incentive spirometry. It is harder to take a deep breath if you are having pain. AFTER USE  Rest and breathe slowly and easily.  It can be helpful to keep track of a log of your progress. Your caregiver can provide you with a simple table to help with this. If you are using the spirometer at home, follow these instructions: [place]  IF:   You are having difficultly using the spirometer.  You have trouble using the spirometer as often as instructed.  Your pain medication is not giving enough relief while using the spirometer.  You develop fever of 100.5 F (38.1 C) or higher. SEEK IMMEDIATE MEDICAL CARE IF:   You cough up bloody sputum that had not been present before.  You develop fever of 102 F (38.9 C) or greater.  You develop worsening pain at or near the incision site. MAKE SURE YOU:   Understand these instructions.  Will watch your condition.  Will get help right away if you are not doing well or get worse. Document Released: 09/15/2006 Document Revised: 07/28/2011 Document Reviewed: 11/16/2006 [facility] Patient Information 2014 [place], [place].   ________________________________________________________________________

## 2018-10-21 ENCOUNTER — Other Ambulatory Visit: Payer: Self-pay

## 2018-10-21 ENCOUNTER — Encounter (HOSPITAL_COMMUNITY)
Admission: RE | Admit: 2018-10-21 | Discharge: 2018-10-21 | Disposition: A | Discharge status: Home / Self Care | Payer: Medicare Other | Source: Ambulatory Visit | Attending: Orthopedic Surgery | Admitting: Orthopedic Surgery

## 2018-10-21 ENCOUNTER — Encounter (HOSPITAL_COMMUNITY): Payer: Self-pay

## 2018-10-21 DIAGNOSIS — Z79899 Other long term (current) drug therapy: Secondary | ICD-10-CM | POA: Diagnosis not present

## 2018-10-21 DIAGNOSIS — I10 Essential (primary) hypertension: Secondary | ICD-10-CM | POA: Insufficient documentation

## 2018-10-21 DIAGNOSIS — I341 Nonrheumatic mitral (valve) prolapse: Secondary | ICD-10-CM | POA: Insufficient documentation

## 2018-10-21 DIAGNOSIS — M19011 Primary osteoarthritis, right shoulder: Secondary | ICD-10-CM | POA: Insufficient documentation

## 2018-10-21 DIAGNOSIS — Z01812 Encounter for preprocedural laboratory examination: Secondary | ICD-10-CM | POA: Diagnosis not present

## 2018-10-21 DIAGNOSIS — Z7902 Long term (current) use of antithrombotics/antiplatelets: Secondary | ICD-10-CM | POA: Diagnosis not present

## 2018-10-21 LAB — BASIC METABOLIC PANEL
Anion gap: 7 (ref 5–15)
BUN: 14 mg/dL (ref 8–23)
CO2: 29 mmol/L (ref 22–32)
Calcium: 8.8 mg/dL — ABNORMAL LOW (ref 8.9–10.3)
Chloride: 105 mmol/L (ref 98–111)
Creatinine, Ser: 0.69 mg/dL (ref 0.44–1.00)
GFR calc Af Amer: >60 mL/min (ref >60)
GFR calc non Af Amer: >60 mL/min (ref >60)
Glucose, Bld: 93 mg/dL (ref 70–99)
Potassium: 3.6 mmol/L (ref 3.5–5.1)
Sodium: 141 mmol/L (ref 135–145)

## 2018-10-21 LAB — CBC
HCT: 40.2 % (ref 36.0–46.0)
Hemoglobin: 12.8 g/dL (ref 12.0–15.0)
MCH: 30.4 pg (ref 26.0–34.0)
MCHC: 31.8 g/dL (ref 30.0–36.0)
MCV: 95.5 fL (ref 80.0–100.0)
Platelets: 189 10*3/uL (ref 150–400)
RBC: 4.21 MIL/uL (ref 3.87–5.11)
RDW: 12.5 % (ref 11.5–15.5)
WBC: 4.4 10*3/uL (ref 4.0–10.5)
nRBC: 0 % (ref 0.0–0.2)

## 2018-10-21 LAB — SURGICAL PCR SCREEN
MRSA, PCR: NEGATIVE
Staphylococcus aureus: NEGATIVE

## 2018-10-21 NOTE — Progress Notes (Signed)
Konrad Felix PA  Plavix stopped 5/31 per Dr. Darvin Neighbours.

## 2018-10-25 ENCOUNTER — Other Ambulatory Visit (HOSPITAL_COMMUNITY)
Admission: RE | Admit: 2018-10-25 | Discharge: 2018-10-25 | Disposition: A | Discharge status: Home / Self Care | Payer: Medicare Other | Source: Ambulatory Visit | Attending: Orthopedic Surgery | Admitting: Orthopedic Surgery

## 2018-10-25 DIAGNOSIS — Z1159 Encounter for screening for other viral diseases: Secondary | ICD-10-CM | POA: Insufficient documentation

## 2018-10-26 LAB — NOVEL CORONAVIRUS, NAA (HOSP ORDER, SEND-OUT TO REF LAB; TAT 18-24 HRS): SARS-CoV-2, NAA: NOT DETECTED

## 2018-10-26 NOTE — Progress Notes (Signed)
Anesthesia Chart Review   Case:  [id number] Date/Time:  10/28/18 0715   Procedure:  REVERSE SHOULDER ARTHROPLASTY (Right ) - 170min   Anesthesia type:  General   Pre-op diagnosis:  right shoulder osteoarthritis   Location:  [facility] ROOM 06 / [facility]   Surgeon:  Justice Britain, MD      DISCUSSION:85 yo former smoker (1 pack years) with h/o MVP, HTN, right shoulder OA scheduled for above procedure 10/28/2018 with Dr. Justice Britain.   Pt reports difficult to wake with previous anesthesia, reports family history of adverse reaction to anesthesia "sister slow to wake and almost died".  Anesthesia records reviewed with no anesthesia complications noted.   Clearance received from PCP, Dr. Gilford Rile, 07/29/18 which states pt is cleared for surgery with the following recommendations to hold Plavix 5 days prior to surgery.    Clearance received from cardiologist, Dr. Gabrielle Dare, 07/28/18 which states, "Harshita Bernales has been seen in our medical office on 05-05-2018.  She had a normal lexiscan stress test and is cleared from a cardiac standpoint for the proposed surgery." (on Care Everywhere)  Surgery previously cancelled due to [place] restrictions, stable since last visit.  Anticipated pt can proceed with planned procedure barring acute status change.  VS: BP 140/64 (BP Location: Right Arm)   Pulse 60   Temp 36.8 C (Oral)   Resp 18   Ht 5\' 5"  (1.651 m)   Wt 70 kg   SpO2 100%   BMI 25.69 kg/m   PROVIDERS: Raina Mina., MD  Atilano Median Gayleen Orem, MD is Cardiologist  LABS: Labs reviewed: Acceptable for surgery. (all labs ordered are listed, but only abnormal results are displayed)  Labs Reviewed  BASIC METABOLIC PANEL - Abnormal; Notable for the following components:      Result Value   Calcium 8.8 (*)    All other components within normal limits  SURGICAL PCR SCREEN  CBC     IMAGES: Carotid Doppler 10/20/16 RIGHT CAROTID ARTERY: Minor carotid intimal thickening  and atherosclerotic plaque formation. No hemodynamically significant right ICA stenosis, velocity elevation, or turbulent flow. Degree of narrowing less than 50%. RIGHT VERTEBRAL ARTERY: Antegrade LEFT CAROTID ARTERY: Similar scattered minor intimal thickening and atherosclerotic plaque formation. No hemodynamically significant left ICA stenosis, velocity elevation, or turbulent flow. LEFT VERTEBRAL ARTERY: Antegrade IMPRESSION: Minor carotid atherosclerosis. No hemodynamically significant ICA stenosis. Degree of narrowing less than 50% bilaterally.   EKG: 03/03/2018 Rate 65 bpm Normal sinus rhythm   CV: Stress Test 04/27/18 Impressions 1. Pharmacologic stress nuclear study is normal 2. No scar or ischemia detected by nuclear imaging.  The gated study showed the ejection fraction was 55-60%.   Echo 10/21/16 Study Conclusions  - Left ventricle: The cavity size was normal. Wall thickness was increased in a pattern of mild LVH. There was focal basal hypertrophy. Systolic function was vigorous. The estimated ejection fraction was in the range of 65% to 70%. Features are consistent with a pseudonormal left ventricular filling pattern, with concomitant abnormal relaxation and increased filling pressure (grade 2 diastolic dysfunction). Doppler parameters are consistent with high ventricular filling pressure. - Mitral valve: Calcified annulus. Mildly thickened leaflets . - Right ventricle: The cavity size was normal. Wall thickness was normal. Systolic function was normal. - Atrial septum: Doppler and agitated saline contrast study showed no atrial level shunt, in the baseline state. - Pericardium, extracardiac: A trivial pericardial effusion was identified posterior to the heart. Past Medical History:  Diagnosis Date  .  Arthritis    bilateral hips and bilateral shoulders.  . Cancer ([place])    skin back, nose  . Complication of anesthesia    heart  stopped with the anesthesia x1,  difficult to wake up  . Dysrhythmia    if taking certain medications  . Family history of adverse reaction to anesthesia    sister slow to awaken- "almost died last time"  . Frequency of urination   . Hypertension   . Jaundice    as a child  . MVP (mitral valve prolapse)   . Pneumonia 09   hx  . 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 Recurrent upper respiratory infection (URI)    Dx 1st of september with sinus infection, placed on antibiotics at that time    Past Surgical History:  Procedure Laterality Date  . ABDOMINAL HYSTERECTOMY  75   partial  . APPENDECTOMY    . BACK SURGERY  2012   kyphoplasty  . BREAST SURGERY     1982,70rt  . CARDIAC CATHETERIZATION     1994, detected MVP  . ELBOW FRACTURE SURGERY Left 98,   hardware removed later in year  . EYE SURGERY     bilateral cataract surgery 2007 with lens implant  . fibroid cyst     ovary right removed 1966  . fibroid tumor     1970, right breast removed  . FRACTURE SURGERY     1988 shattered elbow left  . HARDWARE REMOVAL     left elbow 1998  . TONSILLECTOMY  44   and adenoids  . TOTAL HIP ARTHROPLASTY Left 05/15/2015   Procedure: LEFT TOTAL HIP ARTHROPLASTY ANTERIOR APPROACH;  Surgeon: Rod Can, MD;  Location: [facility];  Service: Orthopedics;  Laterality: Left;    MEDICATIONS: . Ascorbic Acid (VITAMIN C PO)  . calcium carbonate (OS-CAL - DOSED IN MG OF ELEMENTAL CALCIUM) 1250 MG tablet  . cholecalciferol (VITAMIN D) 1000 UNITS tablet  . clopidogrel (PLAVIX) 75 MG tablet  . COD LIVER OIL PO  . ezetimibe (ZETIA) 10 MG tablet  . hydrochlorothiazide (MICROZIDE) 12.5 MG capsule  . losartan (COZAAR) 25 MG tablet  . Multiple Vitamins-Minerals (MULTIVITAMINS THER. W/MINERALS) TABS  . nadolol (CORGARD) 20 MG tablet  . Polyethyl Glycol-Propyl Glycol (SYSTANE) 0.4-0.3 % SOLN  . traMADol (ULTRAM) 50 MG tablet  . TURMERIC PO  . VITAMIN A  PO   No current facility-administered medications for this encounter.     Maia Plan [facility] Pre-Surgical Testing [phone] 10/26/18 11:03 AM

## 2018-10-26 NOTE — Anesthesia Preprocedure Evaluation (Addendum)
Anesthesia Evaluation  Patient identified by MRN, date of birth, ID band Patient awake    History of Anesthesia Complications (+) Family history of anesthesia reaction and history of anesthetic complications (sister slow to awaken)  Airway Mallampati: II  TM Distance: >3 FB Neck ROM: Full    Dental  (+) Dental Advisory Given, Implants, Caps   Pulmonary COPD, former smoker,  10/25/2018 novel coronavirus NEG   breath sounds clear to auscultation       Cardiovascular hypertension, Pt. on medications (-) angina Rhythm:Regular Rate:Normal  '19 Stress: normal '18 ECHO: EF 65-70%, valves OK   Neuro/Psych TIA   GI/Hepatic negative GI ROS, Neg liver ROS,   Endo/Other  negative endocrine ROS  Renal/GU negative Renal ROS     Musculoskeletal  (+) Arthritis ,   Abdominal   Peds  Hematology  (+) Blood dyscrasia (possible prophyria variegata: never symptomatic, never diagnosed), ,   Anesthesia Other Findings   Reproductive/Obstetrics                           Anesthesia Physical Anesthesia Plan  ASA: III  Anesthesia Plan: General   Post-op Pain Management: GA combined w/ Regional for post-op pain   Induction:   PONV Risk Score and Plan: 3 and Ondansetron, Dexamethasone and Treatment may vary due to age or medical condition  Airway Management Planned: Oral ETT  Additional Equipment:   Intra-op Plan:   Post-operative Plan: Extubation in OR  Informed Consent: I have reviewed the patients History and Physical, chart, labs and discussed the procedure including the risks, benefits and alternatives for the proposed anesthesia with the patient or authorized representative who has indicated his/her understanding and acceptance.     Dental advisory given  Plan Discussed with: CRNA and Surgeon  Anesthesia Plan Comments: (See PAT note 10/21/2018, Konrad Felix, PA-C Plan routine monitors, GETA with  Exparel interscalene block for post op analgesia)      Anesthesia Quick Evaluation

## 2018-10-27 NOTE — Progress Notes (Signed)
SPOKE W/  _irene Peatross Surg.time confirmed also     SCREENING SYMPTOMS OF COVID 19:   COUGH--no  RUNNY NOSE--- no  SORE THROAT---no  NASAL CONGESTION----no  SNEEZING----no  SHORTNESS OF BREATH---no  DIFFICULTY BREATHING---no  TEMP >100.0 -----no  UNEXPLAINED BODY ACHES------no  CHILLS -------- no  HEADACHES ---------no  LOSS OF SMELL/ TASTE --------no    HAVE YOU OR ANY FAMILY MEMBER TRAVELLED PAST 14 DAYS OUT OF THE   COUNTY---no STATE----no COUNTRY----no  HAVE YOU OR ANY FAMILY MEMBER BEEN EXPOSED TO ANYONE WITH COVID 19? no

## 2018-10-28 ENCOUNTER — Encounter (HOSPITAL_COMMUNITY)
Admission: RE | Disposition: A | Discharge status: Home / Self Care | Payer: Self-pay | Source: Home / Self Care | Attending: Orthopedic Surgery

## 2018-10-28 ENCOUNTER — Inpatient Hospital Stay (HOSPITAL_COMMUNITY): Payer: Medicare Other | Admitting: Physician Assistant

## 2018-10-28 ENCOUNTER — Encounter (HOSPITAL_COMMUNITY): Payer: Self-pay | Admitting: Emergency Medicine

## 2018-10-28 ENCOUNTER — Other Ambulatory Visit: Payer: Self-pay

## 2018-10-28 ENCOUNTER — Inpatient Hospital Stay (HOSPITAL_COMMUNITY): Payer: Medicare Other | Admitting: Anesthesiology

## 2018-10-28 ENCOUNTER — Inpatient Hospital Stay (HOSPITAL_COMMUNITY)
Admission: RE | Admit: 2018-10-28 | Discharge: 2018-10-29 | DRG: 483 | Disposition: A | Discharge status: Home / Self Care | Payer: Medicare Other | Attending: Orthopedic Surgery | Admitting: Orthopedic Surgery

## 2018-10-28 DIAGNOSIS — I10 Essential (primary) hypertension: Secondary | ICD-10-CM | POA: Diagnosis present

## 2018-10-28 DIAGNOSIS — Z96611 Presence of right artificial shoulder joint: Secondary | ICD-10-CM

## 2018-10-28 DIAGNOSIS — Z96642 Presence of left artificial hip joint: Secondary | ICD-10-CM | POA: Diagnosis present

## 2018-10-28 DIAGNOSIS — Z90710 Acquired absence of both cervix and uterus: Secondary | ICD-10-CM | POA: Diagnosis not present

## 2018-10-28 DIAGNOSIS — Z91040 Latex allergy status: Secondary | ICD-10-CM

## 2018-10-28 DIAGNOSIS — Z882 Allergy status to sulfonamides status: Secondary | ICD-10-CM | POA: Diagnosis not present

## 2018-10-28 DIAGNOSIS — I341 Nonrheumatic mitral (valve) prolapse: Secondary | ICD-10-CM | POA: Diagnosis present

## 2018-10-28 DIAGNOSIS — Z7901 Long term (current) use of anticoagulants: Secondary | ICD-10-CM | POA: Diagnosis not present

## 2018-10-28 DIAGNOSIS — Z79899 Other long term (current) drug therapy: Secondary | ICD-10-CM

## 2018-10-28 DIAGNOSIS — Z79891 Long term (current) use of opiate analgesic: Secondary | ICD-10-CM

## 2018-10-28 DIAGNOSIS — M75100 Unspecified rotator cuff tear or rupture of unspecified shoulder, not specified as traumatic: Secondary | ICD-10-CM | POA: Diagnosis present

## 2018-10-28 DIAGNOSIS — Z87891 Personal history of nicotine dependence: Secondary | ICD-10-CM | POA: Diagnosis not present

## 2018-10-28 DIAGNOSIS — M19011 Primary osteoarthritis, right shoulder: Principal | ICD-10-CM | POA: Diagnosis present

## 2018-10-28 HISTORY — PX: REVERSE SHOULDER ARTHROPLASTY: SHX5054

## 2018-10-28 HISTORY — DX: Presence of right artificial shoulder joint: Z96.611

## 2018-10-28 SURGERY — ARTHROPLASTY, SHOULDER, TOTAL, REVERSE
Anesthesia: General | Laterality: Right

## 2018-10-28 MED ORDER — OXYCODONE HCL 5 MG PO TABS
5.0000 mg | ORAL_TABLET | ORAL | Status: DC | PRN
  Administered 2018-10-29: 5 mg via ORAL
  Filled 2018-10-28: qty 1

## 2018-10-28 MED ORDER — MAGNESIUM CITRATE PO SOLN: 1.0000 | Freq: Once | ORAL | Status: DC | PRN

## 2018-10-28 MED ORDER — PHENOL 1.4 % MT LIQD: 1.0000 | OROMUCOSAL | Status: DC | PRN

## 2018-10-28 MED ORDER — METOCLOPRAMIDE HCL 5 MG PO TABS: 5.0000 mg | ORAL_TABLET | Freq: Three times a day (TID) | ORAL | Status: DC | PRN

## 2018-10-28 MED ORDER — PROPOFOL 10 MG/ML IV BOLUS
INTRAVENOUS | Status: DC | PRN
  Administered 2018-10-28: 120 mg via INTRAVENOUS

## 2018-10-28 MED ORDER — ROCURONIUM BROMIDE 10 MG/ML (PF) SYRINGE
PREFILLED_SYRINGE | INTRAVENOUS | Status: DC | PRN
  Administered 2018-10-28: 40 mg via INTRAVENOUS

## 2018-10-28 MED ORDER — FENTANYL CITRATE (PF) 100 MCG/2ML IJ SOLN
INTRAMUSCULAR | Status: DC | PRN
  Administered 2018-10-28 (×2): 75 ug via INTRAVENOUS

## 2018-10-28 MED ORDER — MIDAZOLAM HCL 2 MG/2ML IJ SOLN: 0.5000 mg | Freq: Once | INTRAMUSCULAR | Status: DC | PRN

## 2018-10-28 MED ORDER — ONDANSETRON HCL 4 MG/2ML IJ SOLN
INTRAMUSCULAR | Status: DC | PRN
  Administered 2018-10-28: 4 mg via INTRAVENOUS

## 2018-10-28 MED ORDER — SODIUM CHLORIDE 0.9 % IR SOLN
Status: DC | PRN
  Administered 2018-10-28: 1000 mL

## 2018-10-28 MED ORDER — HYDROMORPHONE HCL 1 MG/ML IJ SOLN: 0.5000 mg | INTRAMUSCULAR | Status: DC | PRN

## 2018-10-28 MED ORDER — EZETIMIBE 10 MG PO TABS
10.0000 mg | ORAL_TABLET | Freq: Every day | ORAL | Status: DC
  Administered 2018-10-28 – 2018-10-29 (×2): 10 mg via ORAL
  Filled 2018-10-28 (×2): qty 1

## 2018-10-28 MED ORDER — POLYETHYLENE GLYCOL 3350 17 G PO PACK: 17.0000 g | PACK | Freq: Every day | ORAL | Status: DC | PRN

## 2018-10-28 MED ORDER — NADOLOL 20 MG PO TABS
10.0000 mg | ORAL_TABLET | Freq: Two times a day (BID) | ORAL | Status: DC
  Administered 2018-10-28 – 2018-10-29 (×2): 10 mg via ORAL
  Filled 2018-10-28 (×3): qty 1

## 2018-10-28 MED ORDER — EPHEDRINE SULFATE-NACL 50-0.9 MG/10ML-% IV SOSY
PREFILLED_SYRINGE | INTRAVENOUS | Status: DC | PRN
  Administered 2018-10-28: 15 mg via INTRAVENOUS
  Administered 2018-10-28 (×2): 5 mg via INTRAVENOUS

## 2018-10-28 MED ORDER — BISACODYL 5 MG PO TBEC: 5.0000 mg | DELAYED_RELEASE_TABLET | Freq: Every day | ORAL | Status: DC | PRN

## 2018-10-28 MED ORDER — CHLORHEXIDINE GLUCONATE 4 % EX LIQD: 60.0000 mL | Freq: Once | CUTANEOUS | Status: DC

## 2018-10-28 MED ORDER — ONDANSETRON HCL 4 MG/2ML IJ SOLN: 4.0000 mg | Freq: Four times a day (QID) | INTRAMUSCULAR | Status: DC | PRN

## 2018-10-28 MED ORDER — TRANEXAMIC ACID-NACL 1000-0.7 MG/100ML-% IV SOLN
1000.0000 mg | INTRAVENOUS | Status: AC
  Administered 2018-10-28: 1000 mg via INTRAVENOUS
  Filled 2018-10-28: qty 100

## 2018-10-28 MED ORDER — LACTATED RINGERS IV SOLN
INTRAVENOUS | Status: DC
  Administered 2018-10-28 (×2): via INTRAVENOUS

## 2018-10-28 MED ORDER — TRAMADOL HCL 50 MG PO TABS: 50.0000 mg | ORAL_TABLET | Freq: Four times a day (QID) | ORAL | Status: DC | PRN

## 2018-10-28 MED ORDER — DIPHENHYDRAMINE HCL 12.5 MG/5ML PO ELIX: 12.5000 mg | ORAL_SOLUTION | ORAL | Status: DC | PRN

## 2018-10-28 MED ORDER — SODIUM CHLORIDE 0.9 % IV SOLN
INTRAVENOUS | Status: DC | PRN
  Administered 2018-10-28: 20 ug/min via INTRAVENOUS

## 2018-10-28 MED ORDER — FENTANYL CITRATE (PF) 250 MCG/5ML IJ SOLN
INTRAMUSCULAR | Status: AC
  Filled 2018-10-28: qty 5

## 2018-10-28 MED ORDER — FENTANYL CITRATE (PF) 100 MCG/2ML IJ SOLN: 25.0000 ug | INTRAMUSCULAR | Status: DC | PRN

## 2018-10-28 MED ORDER — MENTHOL 3 MG MT LOZG: 1.0000 | LOZENGE | OROMUCOSAL | Status: DC | PRN

## 2018-10-28 MED ORDER — PROPOFOL 10 MG/ML IV BOLUS
INTRAVENOUS | Status: AC
  Filled 2018-10-28: qty 20

## 2018-10-28 MED ORDER — STERILE WATER FOR IRRIGATION IR SOLN
Status: DC | PRN
  Administered 2018-10-28: 2000 mL

## 2018-10-28 MED ORDER — PHENYLEPHRINE 40 MCG/ML (10ML) SYRINGE FOR IV PUSH (FOR BLOOD PRESSURE SUPPORT)
PREFILLED_SYRINGE | INTRAVENOUS | Status: DC | PRN
  Administered 2018-10-28: 80 ug via INTRAVENOUS

## 2018-10-28 MED ORDER — OXYCODONE HCL 5 MG PO TABS: 10.0000 mg | ORAL_TABLET | ORAL | Status: DC | PRN

## 2018-10-28 MED ORDER — ONDANSETRON HCL 4 MG PO TABS: 4.0000 mg | ORAL_TABLET | Freq: Four times a day (QID) | ORAL | Status: DC | PRN

## 2018-10-28 MED ORDER — SUCCINYLCHOLINE CHLORIDE 20 MG/ML IJ SOLN
INTRAMUSCULAR | Status: DC | PRN
  Administered 2018-10-28: 100 mg via INTRAVENOUS

## 2018-10-28 MED ORDER — MEPERIDINE HCL 50 MG/ML IJ SOLN: 6.2500 mg | INTRAMUSCULAR | Status: DC | PRN

## 2018-10-28 MED ORDER — GLYCOPYRROLATE PF 0.2 MG/ML IJ SOSY
PREFILLED_SYRINGE | INTRAMUSCULAR | Status: DC | PRN
  Administered 2018-10-28: .2 mg via INTRAVENOUS

## 2018-10-28 MED ORDER — METHOCARBAMOL 500 MG IVPB - SIMPLE MED
500.0000 mg | Freq: Four times a day (QID) | INTRAVENOUS | Status: DC | PRN
  Filled 2018-10-28: qty 50

## 2018-10-28 MED ORDER — ACETAMINOPHEN 325 MG PO TABS: 325.0000 mg | ORAL_TABLET | Freq: Four times a day (QID) | ORAL | Status: DC | PRN

## 2018-10-28 MED ORDER — LOSARTAN POTASSIUM 25 MG PO TABS: 25.0000 mg | ORAL_TABLET | ORAL | Status: DC

## 2018-10-28 MED ORDER — LACTATED RINGERS IV SOLN
INTRAVENOUS | Status: DC
  Administered 2018-10-28: 12:00:00 via INTRAVENOUS

## 2018-10-28 MED ORDER — ALUM & MAG HYDROXIDE-SIMETH 200-200-20 MG/5ML PO SUSP: 30.0000 mL | ORAL | Status: DC | PRN

## 2018-10-28 MED ORDER — PROMETHAZINE HCL 25 MG/ML IJ SOLN: 6.2500 mg | INTRAMUSCULAR | Status: DC | PRN

## 2018-10-28 MED ORDER — BUPIVACAINE LIPOSOME 1.3 % IJ SUSP
INTRAMUSCULAR | Status: DC | PRN
  Administered 2018-10-28: 10 mL via PERINEURAL

## 2018-10-28 MED ORDER — DEXAMETHASONE SODIUM PHOSPHATE 10 MG/ML IJ SOLN
INTRAMUSCULAR | Status: DC | PRN
  Administered 2018-10-28: 5 mg via INTRAVENOUS

## 2018-10-28 MED ORDER — METHOCARBAMOL 500 MG PO TABS
500.0000 mg | ORAL_TABLET | Freq: Four times a day (QID) | ORAL | Status: DC | PRN
  Administered 2018-10-29: 500 mg via ORAL
  Filled 2018-10-28 (×2): qty 1

## 2018-10-28 MED ORDER — METOCLOPRAMIDE HCL 5 MG/ML IJ SOLN: 5.0000 mg | Freq: Three times a day (TID) | INTRAMUSCULAR | Status: DC | PRN

## 2018-10-28 MED ORDER — BUPIVACAINE HCL (PF) 0.5 % IJ SOLN
INTRAMUSCULAR | Status: DC | PRN
  Administered 2018-10-28: 15 mL via PERINEURAL

## 2018-10-28 MED ORDER — LOSARTAN POTASSIUM 25 MG PO TABS
25.0000 mg | ORAL_TABLET | Freq: Two times a day (BID) | ORAL | Status: DC
  Administered 2018-10-28 – 2018-10-29 (×3): 25 mg via ORAL
  Filled 2018-10-28 (×3): qty 1

## 2018-10-28 MED ORDER — SUGAMMADEX SODIUM 200 MG/2ML IV SOLN
INTRAVENOUS | Status: DC | PRN
  Administered 2018-10-28: 200 mg via INTRAVENOUS

## 2018-10-28 MED ORDER — PHENYLEPHRINE HCL (PRESSORS) 10 MG/ML IV SOLN
INTRAVENOUS | Status: AC
  Filled 2018-10-28: qty 9

## 2018-10-28 MED ORDER — DOCUSATE SODIUM 100 MG PO CAPS
100.0000 mg | ORAL_CAPSULE | Freq: Two times a day (BID) | ORAL | Status: DC
  Administered 2018-10-28 – 2018-10-29 (×3): 100 mg via ORAL
  Filled 2018-10-28 (×3): qty 1

## 2018-10-28 MED ORDER — HYDROCHLOROTHIAZIDE 12.5 MG PO CAPS
12.5000 mg | ORAL_CAPSULE | Freq: Every day | ORAL | Status: DC
  Administered 2018-10-28 – 2018-10-29 (×2): 12.5 mg via ORAL
  Filled 2018-10-28 (×2): qty 1

## 2018-10-28 MED ORDER — CEFAZOLIN SODIUM-DEXTROSE 2-4 GM/100ML-% IV SOLN
2.0000 g | INTRAVENOUS | Status: AC
  Administered 2018-10-28: 2 g via INTRAVENOUS
  Filled 2018-10-28: qty 100

## 2018-10-28 SURGICAL SUPPLY — 60 items
BAG ZIPLOCK 12X15 (MISCELLANEOUS) ×3 IMPLANT
BLADE SAW SGTL 83.5X18.5 (BLADE) ×3 IMPLANT
CHLORAPREP W/TINT 26 (MISCELLANEOUS) ×3 IMPLANT
COOLER ICEMAN CLASSIC (MISCELLANEOUS) IMPLANT
COVER SURGICAL LIGHT HANDLE (MISCELLANEOUS) ×3 IMPLANT
COVER WAND RF STERILE (DRAPES) IMPLANT
CUP SUT UNIV REVERS 36 NEUTRAL (Cup) ×3 IMPLANT
DERMABOND ADVANCED (GAUZE/BANDAGES/DRESSINGS) ×2
DERMABOND ADVANCED .7 DNX12 (GAUZE/BANDAGES/DRESSINGS) ×1 IMPLANT
DRAPE INCISE IOBAN 66X45 STRL (DRAPES) IMPLANT
DRAPE ORTHO SPLIT 77X108 STRL (DRAPES) ×4
DRAPE SURG 17X11 SM STRL (DRAPES) ×3 IMPLANT
DRAPE SURG ORHT 6 SPLT 77X108 (DRAPES) ×2 IMPLANT
DRAPE U-SHAPE 47X51 STRL (DRAPES) ×3 IMPLANT
DRSG AQUACEL AG ADV 3.5X 6 (GAUZE/BANDAGES/DRESSINGS) ×3 IMPLANT
DRSG AQUACEL AG ADV 3.5X10 (GAUZE/BANDAGES/DRESSINGS) ×3 IMPLANT
ELECT BLADE TIP CTD 4 INCH (ELECTRODE) ×3 IMPLANT
ELECT REM PT RETURN 15FT ADLT (MISCELLANEOUS) ×3 IMPLANT
FACESHIELD WRAPAROUND (MASK) ×9 IMPLANT
GLENOID UNI REV MOD 24 +2 LAT (Joint) ×3 IMPLANT
GLENOSPHERE 36 +4 LAT/24 (Joint) ×3 IMPLANT
GLOVE BIO SURGEON STRL SZ7.5 (GLOVE) ×3 IMPLANT
GLOVE BIO SURGEON STRL SZ8 (GLOVE) ×3 IMPLANT
GLOVE SS BIOGEL STRL SZ 7 (GLOVE) ×1 IMPLANT
GLOVE SS BIOGEL STRL SZ 7.5 (GLOVE) ×1 IMPLANT
GLOVE SUPERSENSE BIOGEL SZ 7 (GLOVE) ×2
GLOVE SUPERSENSE BIOGEL SZ 7.5 (GLOVE) ×2
GOWN STRL REUS W/TWL LRG LVL3 (GOWN DISPOSABLE) ×6 IMPLANT
INSERT HUMERAL 36 +6 (Shoulder) ×3 IMPLANT
KIT BASIN OR (CUSTOM PROCEDURE TRAY) ×3 IMPLANT
KIT TURNOVER KIT A (KITS) IMPLANT
MANIFOLD NEPTUNE II (INSTRUMENTS) ×3 IMPLANT
NEEDLE TAPERED W/ NITINOL LOOP (MISCELLANEOUS) IMPLANT
NS IRRIG 1000ML POUR BTL (IV SOLUTION) ×3 IMPLANT
PACK SHOULDER (CUSTOM PROCEDURE TRAY) ×3 IMPLANT
PAD ARMBOARD 7.5X6 YLW CONV (MISCELLANEOUS) ×6 IMPLANT
PAD COLD SHLDR WRAP-ON (PAD) IMPLANT
PIN SET MODULAR GLENOID SYSTEM (PIN) ×3 IMPLANT
PROTECTOR NERVE ULNAR (MISCELLANEOUS) ×3 IMPLANT
RESTRAINT HEAD UNIVERSAL NS (MISCELLANEOUS) ×3 IMPLANT
SCREW CENTRAL MOD 30MM (Screw) ×3 IMPLANT
SCREW PERI LOCK 5.5X16 (Screw) ×3 IMPLANT
SCREW PERI LOCK 5.5X24 (Screw) ×3 IMPLANT
SCREW PERI LOCK 5.5X32 (Screw) ×3 IMPLANT
SCREW PERIPHERAL 5.5X20 LOCK (Screw) ×3 IMPLANT
SLING ARM FOAM STRAP LRG (SOFTGOODS) IMPLANT
SLING ARM FOAM STRAP MED (SOFTGOODS) ×3 IMPLANT
SLING ARM IMMOBILIZER MED (SOFTGOODS) ×3 IMPLANT
SPONGE LAP 18X18 RF (DISPOSABLE) IMPLANT
STEM HUMERAL UNIVERS SZ8 (Stem) ×3 IMPLANT
SUCTION FRAZIER HANDLE 12FR (TUBING) ×2
SUCTION TUBE FRAZIER 12FR DISP (TUBING) ×1 IMPLANT
SUT MNCRL AB 3-0 PS2 18 (SUTURE) ×3 IMPLANT
SUT MON AB 2-0 CT1 36 (SUTURE) ×3 IMPLANT
SUT VIC AB 1 CT1 36 (SUTURE) ×3 IMPLANT
SUTURE TAPE 1.3 40 TPR END (SUTURE) ×2 IMPLANT
SUTURETAPE 1.3 40 TPR END (SUTURE) ×6
TOWEL OR 17X26 10 PK STRL BLUE (TOWEL DISPOSABLE) ×6 IMPLANT
TOWEL OR NON WOVEN STRL DISP B (DISPOSABLE) ×3 IMPLANT
WATER STERILE IRR 1000ML POUR (IV SOLUTION) ×6 IMPLANT

## 2018-10-28 NOTE — Anesthesia Procedure Notes (Signed)
Procedure Name: Intubation Performed by: Nayra Coury J, CRNA Pre-anesthesia Checklist: Patient identified, Emergency Drugs available, Suction available, Patient being monitored and Timeout performed Patient Re-evaluated:Patient Re-evaluated prior to induction Oxygen Delivery Method: Circle system utilized Preoxygenation: Pre-oxygenation with 100% oxygen Induction Type: IV induction Ventilation: Mask ventilation without difficulty Laryngoscope Size: Mac and 4 Grade View: Grade II Tube type: Oral Tube size: 7.0 mm Number of attempts: 1 Airway Equipment and Method: Stylet Placement Confirmation: ETT inserted through vocal cords under direct vision,  positive ETCO2 and breath sounds checked- equal and bilateral Secured at: 21 cm Tube secured with: Tape Dental Injury: Teeth and Oropharynx as per pre-operative assessment        

## 2018-10-28 NOTE — Anesthesia Procedure Notes (Signed)
Anesthesia Regional Block: Interscalene brachial plexus block   Pre-Anesthetic Checklist: ,, timeout performed, Correct Patient, Correct Site, Correct Laterality, Correct Procedure, Correct Position, site marked, Risks and benefits discussed,  Surgical consent,  Pre-op evaluation,  At surgeon's request and post-op pain management  Laterality: Right and Upper  Prep: chloraprep       Needles:  Injection technique: Single-shot  Needle Type: Echogenic Stimulator Needle     Needle Length: 9cm  Needle Gauge: 21     Additional Needles:   Procedures:, nerve stimulator,,, ultrasound used (permanent image in chart),,,,   Nerve Stimulator or Paresthesia:  Response: hand twitch, 0.45 mA, 0.1 ms,   Additional Responses:   Narrative:  Start time: 10/28/2018 7:08 AM End time: 10/28/2018 7:15 AM Injection made incrementally with aspirations every 5 mL.  Performed by: Personally  Anesthesiologist: Annye Asa, MD  Additional Notes: Pt identified in Holding room.  Monitors applied. Working IV access confirmed. Sterile prep, drape.  #21ga PNS to twitch at 0.67mA threshold with US guidance.  15cc 0.5% Bupivacaine, Exparel injected incrementally after negative test dose.  Patient asymptomatic, VSS, no heme aspirated, tolerated well.  Jenita Seashore, MD

## 2018-10-28 NOTE — H&P (Signed)
Virgilio Frees    Chief Complaint: right shoulder osteoarthritis HPI: The patient is a 85 y.o. female with end stage right shoulder osteoarthritis and associated severe rotator cuff dysfunction  Past Medical History:  Diagnosis Date  . Arthritis    bilateral hips and bilateral shoulders.  . Cancer ([place])    skin back, nose  . Complication of anesthesia    heart stopped with the anesthesia x1,  difficult to wake up  . Dysrhythmia    if taking certain medications  . Family history of adverse reaction to anesthesia    sister slow to awaken- "almost died last time"  . Frequency of urination   . Hypertension   . Jaundice    as a child  . MVP (mitral valve prolapse)   . Pneumonia 09   hx  . 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 Recurrent upper respiratory infection (URI)    Dx 1st of september with sinus infection, placed on antibiotics at that time    Past Surgical History:  Procedure Laterality Date  . ABDOMINAL HYSTERECTOMY  75   partial  . APPENDECTOMY    . BACK SURGERY  2012   kyphoplasty  . BREAST SURGERY     1982,70rt  . CARDIAC CATHETERIZATION     1994, detected MVP  . ELBOW FRACTURE SURGERY Left 98,   hardware removed later in year  . EYE SURGERY     bilateral cataract surgery 2007 with lens implant  . fibroid cyst     ovary right removed 1966  . fibroid tumor     1970, right breast removed  . FRACTURE SURGERY     1988 shattered elbow left  . HARDWARE REMOVAL     left elbow 1998  . TONSILLECTOMY  44   and adenoids  . TOTAL HIP ARTHROPLASTY Left 05/15/2015   Procedure: LEFT TOTAL HIP ARTHROPLASTY ANTERIOR APPROACH;  Surgeon: Rod Can, MD;  Location: [facility];  Service: Orthopedics;  Laterality: Left;    Family History  Problem Relation Age of Onset  . Anesthesia problems Father     Social History:  reports that she has quit smoking. She has a 1.00 pack-year smoking history. She has never used smokeless  tobacco. She reports that she does not drink alcohol or use drugs.   Medications Prior to Admission  Medication Sig Dispense Refill  . Ascorbic Acid (VITAMIN C PO) Take 1 tablet by mouth daily.    . calcium carbonate (OS-CAL - DOSED IN MG OF ELEMENTAL CALCIUM) 1250 MG tablet Take 1 tablet by mouth daily.      . cholecalciferol (VITAMIN D) 1000 UNITS tablet Take 1,000 Units by mouth daily.     . clopidogrel (PLAVIX) 75 MG tablet Take 37.5 mg by mouth daily.    . COD LIVER OIL PO Take 5 mLs by mouth daily. Take 1 teaspoon    . ezetimibe (ZETIA) 10 MG tablet Take 10 mg by mouth daily.    . hydrochlorothiazide (MICROZIDE) 12.5 MG capsule Take 1 capsule (12.5 mg total) by mouth daily. 30 capsule 0  . losartan (COZAAR) 25 MG tablet Take 25 mg by mouth See admin instructions. Take 1 tablet twice a day with meals. Take 1 tablet at bedtime as needed for "high blood pressure" - when dizzy, heart rate fast, and facial flushing.  0  . Multiple Vitamins-Minerals (MULTIVITAMINS THER. W/MINERALS) TABS Take 1 tablet by mouth daily.      Marland Kitchen  nadolol (CORGARD) 20 MG tablet Take 10 mg by mouth 2 (two) times daily with breakfast and lunch.     Vladimir Faster Glycol-Propyl Glycol (SYSTANE) 0.4-0.3 % SOLN Place 2 drops into both eyes 3 (three) times daily as needed (dry eyes).     . traMADol (ULTRAM) 50 MG tablet Take 25-50 mg by mouth every 12 (twelve) hours as needed for pain.    . TURMERIC PO Take 1 tablet by mouth daily.     Marland Kitchen VITAMIN A PO Take 1 tablet by mouth daily.       Physical Exam: Right shoulder demonstrates painful and severely restricted motion as documented at her recent office visits  Vitals  Temp:  [98.2 F (36.8 C)] 98.2 F (36.8 C) (06/11 0625) Pulse Rate:  [65] 65 (06/11 0625) Resp:  [18] 18 (06/11 0625) BP: (122)/(64) 122/64 (06/11 0625) SpO2:  [100 %] 100 % (06/11 0625) Weight:  [70 kg] 70 kg (06/11 0547)  Assessment/Plan  Impression: right shoulder osteoarthritis  Plan of Action:  Procedure(s): REVERSE SHOULDER ARTHROPLASTY  Kip Cropp M Ayushi Pla 10/28/2018, 6:46 AM Contact # [phone]

## 2018-10-28 NOTE — Op Note (Signed)
10/28/2018  9:30 AM  PATIENT:   Chelsea Mcintyre  85 y.o. female  PRE-OPERATIVE DIAGNOSIS:  right shoulder osteoarthritis with associated severe rotator cuff dysfunction  POST-OPERATIVE DIAGNOSIS: Same  PROCEDURE: Right shoulder reverse arthroplasty utilizing a press-fit size 8 Arthrex stem, a +6 polyethylene insert, a 36/+4 glenosphere on a small baseplate +2.  SURGEON:  Marin Shutter M.D.  ASSISTANTS: Jenetta Loges, PA-C  ANESTHESIA:   General endotracheal and interscalene block with Exparel  EBL: 150 cc  SPECIMEN: None  Drains: None   PATIENT DISPOSITION:  PACU - hemodynamically stable.    PLAN OF CARE: Admit for overnight observation  Brief history:  Chelsea Mcintyre is an 85 year old female who has had chronic and progressively increasing right shoulder pain related to end stage rotator cuff tear arthropathy.  She is brought to the operating this time for planned right shoulder reverse arthroplasty  Preoperatively I counseled Chelsea Mcintyre regarding treatment options as well as the potential risks versus benefits thereof.  Possible surgical complications were reviewed including bleeding, infection, neurovascular injury, persistent pain, loss of motion, anesthetic complication, failure the implant, and possible need for additional surgery.  She understands and accepts and agrees with our planned procedure.  Procedure in detail:  After undergoing routine preop evaluation patient received prophylactic antibiotics and an interscalene block with Exparel was established in the holding area by the anesthesia department.  Placed supine on the operating table underwent smooth induction of a general endotracheal anesthesia.  Placed into the beachchair position and appropriately padded and protected.  Right shoulder girdle region was sterilely prepped and draped in standard fashion.  Timeout was called.  An anterior deltopectoral approach to the right shoulder was made through an 8 cm  incision.  Skin flaps elevated dissection carried deeply deltopectoral interval developed from proximal to distal with a vein taken laterally in the upper centimeter the pectoralis major tenotomized for exposure.  Conjoined 10 mobilized and retracted medially.  Long head biceps tendon tenodesis the upper border the pectoralis major.  Subscapularis and elevated from the lesser tuberosity with a free margin tagged with a pair of suture tape sutures in the proximal segment of the bicep tendon was unroofed and excised and the rotator interval split to the base of the coracoid and the subscap reflected medially.  Capsule attachments from the anterior and infra margins of the humeral neck were divided and humeral head delivered through the wound.  An extra medullary guide was then used to outline the proposed humeral head resection which is then performed with an oscillating saw protecting the rotator cuff superiorly and posteriorly large osteophytes removed from the humeral neck region with a rondure.  Metal cap placed at the cut proximal humeral surface at this point we exposed the glenoid with appropriate retractors and performed a circumferential labral resection.  A guidepin was then directed into the center of the glenoid with an approximate 10 degree inferior tilt and the glenoid was then reamed to a stable subchondral bony bed and then the peripheral reamer was utilized removing all residual bony and soft tissue debris the joint was copes irrigated and all debris was removed.  The central hole of the glenoid was then drilled and tapped and a 30 mm lag screw was assembled onto her baseplate the baseplate was then introduced with excellent fit fixation and the peripheral locking screws were all then placed.  A 36/+4 glenosphere was then impacted on the baseplate and the central locking screw was placed with good fixation.  We then returned our attention to the proximal humerus where the canal was hand reamed and  then broached up to size 8 and metaphyseal reamer used with posterior offset.  A trial implant was then placed in the showed good soft tissue balance and good stability.  Our final implant was then assembled and impacted into the humeral canal and then once again we performed a series reductions and felt that the +6 poly-gave [place] the best soft tissue balance with excellent stability good motion.  The final polyethylene insert was then inserted and final reduction was then performed.  We are very pleased with the overall shoulder motion and stability and soft tissue balance.  Joint was copes irrigated hemostasis was obtained.  The subscapularis was confirmed at good mobility and was then repaired back to the collar of the implant using the suture tape sutures.  The deltopectoral interval was then reapproximated with a series of figure-of-eight #1 Vicryl sutures.  2-0 Vicryl used with subcu layer and intracuticular 3 Monocryl for the skin.  Dermabond was then applied followed by Aquasol dressing right arm was placed in the sling the patient was awakened extubated and taken recovery in stable addition.  Jenetta Loges, PA-C was used as an Environmental consultant throughout this case essential for help with positioning of the patient, position extremity, soft tissue retraction.  Implantation prosthesis, wound closure, and intraoperative decision making.  Metta Clines Larwence Tu MD   Contact # [phone]

## 2018-10-28 NOTE — Anesthesia Postprocedure Evaluation (Addendum)
Anesthesia Post Note  Patient: CYPRESS HINKSON  Procedure(s) Performed: REVERSE SHOULDER ARTHROPLASTY (Right )     Patient location during evaluation: PACU Anesthesia Type: General and Regional Level of consciousness: awake and alert, patient cooperative and oriented Pain management: pain level controlled Vital Signs Assessment: post-procedure vital signs reviewed and stable Respiratory status: spontaneous breathing, nonlabored ventilation and respiratory function stable Cardiovascular status: blood pressure returned to baseline and stable Postop Assessment: no apparent nausea or vomiting Anesthetic complications: no    Last Vitals:  Vitals:   10/28/18 1110 10/28/18 1132  BP:  (!) 142/65  Pulse:  (!) 58  Resp:  16  Temp: 36.4 C (!) 36.4 C  SpO2:  100%    Last Pain:  Vitals:   10/28/18 1132  TempSrc: Oral  PainSc:                  Manoah Deckard,E. Shahzaib Azevedo

## 2018-10-28 NOTE — Transfer of Care (Signed)
Immediate Anesthesia Transfer of Care Note  Patient: Chelsea Mcintyre  Procedure(s) Performed: REVERSE SHOULDER ARTHROPLASTY (Right )  Patient Location: PACU  Anesthesia Type:General  Level of Consciousness: sedated, patient cooperative and responds to stimulation  Airway & Oxygen Therapy: Patient Spontanous Breathing and Patient connected to face mask oxygen  Post-op Assessment: Report given to RN and Post -op Vital signs reviewed and stable  Post vital signs: Reviewed and stable  Last Vitals:  Vitals Value Taken Time  BP 162/80 10/28/18 0935  Temp    Pulse 63 10/28/18 0937  Resp 22 10/28/18 0937  SpO2 100 % 10/28/18 0937  Vitals shown include unvalidated device data.  Last Pain:  Vitals:   10/28/18 0625  TempSrc: Oral  PainSc:       Patients Stated Pain Goal: 3 (35/46/56 8127)  Complications: No apparent anesthesia complications

## 2018-10-28 NOTE — Discharge Instructions (Signed)
° °Kevin M. Supple, M.D., F.A.A.O.S. °Orthopaedic Surgery °Specializing in Arthroscopic and Reconstructive °Surgery of the Shoulder °[phone] °[address] - [place], [place] [address] - Fax [phone] ° ° °POST-OP TOTAL SHOULDER REPLACEMENT INSTRUCTIONS ° °1. Call the office at [phone] to schedule your first post-op appointment 10-14 days from the date of your surgery. ° °2. The bandage over your incision is waterproof. You may begin showering with this dressing on. You may leave this dressing on until first follow up appointment within 2 weeks. We prefer you leave this dressing in place until follow up however after 5-7 days if you are having itching or skin irritation and would like to remove it you may do so. Go slow and tug at the borders gently to break the bond the dressing has with the skin. At this point if there is no drainage it is okay to go without a bandage or you may cover it with a light guaze and tape. You can also expect significant bruising around your shoulder that will drift down your arm and into your chest wall. This is very normal and should resolve over several days. ° ° 3. Wear your sling/immobilizer at all times except to perform the exercises below or to occasionally let your arm dangle by your side to stretch your elbow. You also need to sleep in your sling immobilizer until instructed otherwise. It is ok to remove your sling if you are sitting in a controlled environment and allow your arm to rest in a position of comfort by your side or on your lap with pillows to give your neck and skin a break from the sling. You may remove it to allow arm to dangle by side to shower. If you are up walking around and when you go to sleep at night you need to wear it. ° °4. Range of motion to your elbow, wrist, and hand are encouraged 3-5 times daily. Exercise to your hand and fingers helps to reduce swelling you may experience. ° °5. Utilize ice to the shoulder 3-5 times  minimum a day and additionally if you are experiencing pain. ° °6. Prescriptions for a pain medication and a muscle relaxant are provided for you. It is recommended that if you are experiencing pain that you pain medication alone is not controlling, add the muscle relaxant along with the pain medication which can give additional pain relief. The first 1-2 days is generally the most severe of your pain and then should gradually decrease. As your pain lessens it is recommended that you decrease your use of the pain medications to an "as needed basis'" only and to always comply with the recommended dosages of the pain medications. ° °7. Pain medications can produce constipation along with their use. If you experience this, the use of an over the counter stool softener or laxative daily is recommended.  ° °8. For additional questions or concerns, please do not hesitate to call the office. If after hours there is an answering service to forward your concerns to the physician on call. ° °9.Pain control following an exparel block ° °To help control your post-operative pain you received a nerve block  performed with Exparel which is a long acting anesthetic (numbing agent) which can provide pain relief and sensations of numbness (and relief of pain) in the operative shoulder and arm for up to 3 days. Sometimes it provides mixed relief, meaning you may still have numbness in certain areas of the arm but can still   be able to move  parts of that arm, hand, and fingers. We recommend that your prescribed pain medications  be used as needed. We do not feel it is necessary to "pre medicate" and "stay ahead" of pain.  Taking narcotic pain medications when you are not having any pain can lead to unnecessary and potentially dangerous side effects.    POST-OP EXERCISES  Pendulum Exercises  Perform pendulum exercises while standing and bending at the waist. Support your uninvolved arm on a table or chair and allow your operated  arm to hang freely. Make sure to do these exercises passively - not using you shoulder muscles.  Repeat 20 times. Do 3 sessions per day.     Use the ice machine as much as possible in the first 5-7 days from surgery, then you can wean its use to as needed. The ice typically needs to be replaced every 6 hours, instead of ice you can actually freeze water bottles to put in the cooler and then fill water around them to avoid having to purchase ice. You can have spare water bottles freezing to allow you to rotate them once they have melted. Try to have a thin shirt or light cloth or towel under the ice wrap to protect your skin.

## 2018-10-29 MED ORDER — METHOCARBAMOL 500 MG PO TABS: 500.0000 mg | ORAL_TABLET | Freq: Three times a day (TID) | ORAL | 1 refills | Status: DC | PRN

## 2018-10-29 MED ORDER — ONDANSETRON HCL 4 MG PO TABS: 4.0000 mg | ORAL_TABLET | Freq: Three times a day (TID) | ORAL | 0 refills | Status: DC | PRN

## 2018-10-29 MED ORDER — HYDROCODONE-ACETAMINOPHEN 5-325 MG PO TABS: 1.0000 | ORAL_TABLET | Freq: Four times a day (QID) | ORAL | 0 refills | Status: DC | PRN

## 2018-10-29 NOTE — Evaluation (Signed)
Occupational Therapy Evaluation Patient Details Name: Chelsea Mcintyre MRN: [id number] DOB: Sep 21, 1932 Today's Date: 10/29/2018    History of Present Illness s/p R RTSA.  PMH:  HTN, MVP   Clinical Impression   This 85 year old female was admitted for the above sx. All education was completed.  Pt will follow up with Dr Onnie Graham for further rehab    Follow Up Recommendations  Supervision/Assistance - 24 hour;Follow surgeon's recommendation for DC plan and follow-up therapies    Equipment Recommendations  None recommended by OT    Recommendations for Other Services       Precautions / Restrictions Precautions Precautions: Shoulder;Fall Type of Shoulder Precautions: sling on except for bathing/dressing and exercises.  May be out of sling in controlled environment with pillows behind and under arm.  May use arm during adls P/AA/AROM 20 ER, 45 ABD, and 60 FF.  May move elbow through fingers, do lap slides and gentle pendulums Restrictions RUE Weight Bearing: Non weight bearing      Mobility Bed Mobility               General bed mobility comments: oob  Transfers Overall transfer level: Needs assistance               General transfer comment: min guard for safety, walking to bathroom    Balance                                           ADL either performed or assessed with clinical judgement   ADL Overall ADL's : Needs assistance/impaired Eating/Feeding: Set up   Grooming: Set up   Upper Body Bathing: Minimal assistance;Moderate assistance;Sitting   Lower Body Bathing: Moderate assistance;Sit to/from stand   Upper Body Dressing : Moderate assistance;Sitting   Lower Body Dressing: Moderate assistance;Sit to/from stand   Toilet Transfer: Min guard;Ambulation;Comfort height toilet   Toileting- Clothing Manipulation and Hygiene: Minimal assistance;Sit to/from stand         General ADL Comments: more assistance due to block in effect.   Reviewed protocol, using arm for adls within parameters and exercises.  Performed all.  Pt verbalizes understanding of all education     Vision         Perception     Praxis      Pertinent Vitals/Pain Pain Assessment: No/denies pain     Hand Dominance Left   Extremity/Trunk Assessment Upper Extremity Assessment Upper Extremity Assessment: RUE deficits/detail RUE Deficits / Details: block in effect but able to move joints:  thumb moving only a little           Communication Communication Communication: No difficulties   Cognition Arousal/Alertness: Awake/alert Behavior During Therapy: WFL for tasks assessed/performed Overall Cognitive Status: Within Functional Limits for tasks assessed                                     General Comments       Exercises Exercises: (lapslides, pendulums, elbow to finger ROM)   Shoulder Instructions      Home Living Family/patient expects to be discharged to:: Private residence Living Arrangements: Alone           Home Layout: (living on first floor; rails to get in both sides)     Bathroom Shower/Tub: Walk-in shower  Bathroom Toilet: Handicapped height     Home Equipment: Shower seat(lift chair and bed)   Additional Comments: daughter and daughter in law avail on/off x 3 weeks      Prior Functioning/Environment Level of Independence: Independent                 OT Problem List:        OT Treatment/Interventions:      OT Goals(Current goals can be found in the care plan section) Acute Rehab OT Goals Patient Stated Goal: return to independence OT Goal Formulation: All assessment and education complete, DC therapy  OT Frequency:     Barriers to D/C:            Co-evaluation              AM-PAC OT "6 Clicks" Daily Activity     Outcome Measure Help from another person eating meals?: A Little Help from another person taking care of personal grooming?: A Little Help from another  person toileting, which includes using toliet, bedpan, or urinal?: A Little Help from another person bathing (including washing, rinsing, drying)?: A Lot Help from another person to put on and taking off regular upper body clothing?: A Lot Help from another person to put on and taking off regular lower body clothing?: A Lot 6 Click Score: 15   End of Session    Activity Tolerance: Patient tolerated treatment well Patient left: in chair;with call bell/phone within reach  OT Visit Diagnosis: Muscle weakness (generalized) (M62.81)                Time: 2409-7353 OT Time Calculation (min): 42 min Charges:  OT General Charges $OT Visit: 1 Visit OT Evaluation $OT Eval Low Complexity: 1 Low OT Treatments $Self Care/Home Management : 8-22 mins $Therapeutic Exercise: 8-22 mins  Lesle Chris, OTR/L Acute Rehabilitation Services [phone] [facility] pager [phone] office 10/29/2018  [place] 10/29/2018, 9:22 AM

## 2018-10-29 NOTE — Plan of Care (Signed)
  Problem: Education: Goal: Knowledge of the prescribed therapeutic regimen will improve Outcome: Adequate for Discharge Goal: Understanding of activity limitations/precautions following surgery will improve Outcome: Adequate for Discharge Goal: Individualized Educational Video(s) Outcome: Adequate for Discharge   Problem: Activity: Goal: Ability to tolerate increased activity will improve Outcome: Adequate for Discharge   Problem: Pain Management: Goal: Pain level will decrease with appropriate interventions Outcome: Adequate for Discharge  Home with daughter. RN and patient went over the entire discharge packet. All questions answered.

## 2018-11-01 ENCOUNTER — Encounter (HOSPITAL_COMMUNITY): Payer: Self-pay | Admitting: Orthopedic Surgery

## 2018-11-03 NOTE — Discharge Summary (Signed)
PATIENT ID:      Chelsea Mcintyre  MRN:     [id number] DOB/AGE:    [birth year] / 85 y.o.     DISCHARGE SUMMARY  ADMISSION DATE:    10/28/2018 DISCHARGE DATE:  10/29/2018  ADMISSION DIAGNOSIS: right shoulder osteoarthritis Past Medical History:  Diagnosis Date  . Arthritis    bilateral hips and bilateral shoulders.  . Cancer ([place])    skin back, nose  . Complication of anesthesia    heart stopped with the anesthesia x1,  difficult to wake up  . Dysrhythmia    if taking certain medications  . Family history of adverse reaction to anesthesia    sister slow to awaken- "almost died last time"  . Frequency of urination   . Hypertension   . Jaundice    as a child  . MVP (mitral valve prolapse)   . Pneumonia 09   hx  . 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 Recurrent upper respiratory infection (URI)    Dx 1st of september with sinus infection, placed on antibiotics at that time    DISCHARGE DIAGNOSIS:   Active Problems:   S/P reverse total shoulder arthroplasty, right   PROCEDURE: Procedure(s): REVERSE SHOULDER ARTHROPLASTY on 10/28/2018  CONSULTS:    HISTORY:  See H&P in chart.  HOSPITAL COURSE:  Chelsea Mcintyre is a 85 y.o. admitted on 10/28/2018 with a diagnosis of right shoulder osteoarthritis.  They were brought to the operating room on 10/28/2018 and underwent Procedure(s): [place].    They were given perioperative antibiotics:  Anti-infectives (From admission, onward)   Start     Dose/Rate Route Frequency Ordered Stop   10/28/18 0600  ceFAZolin (ANCEF) IVPB 2g/100 mL premix     2 g 200 mL/hr over 30 Minutes Intravenous On call to O.R. 10/28/18 2376 10/28/18 2831    .  Patient underwent the above named procedure and tolerated it well. The following day they were hemodynamically stable and pain was controlled on oral analgesics. They were neurovascularly intact to the operative extremity. OT was ordered  and worked with patient per protocol. They were medically and orthopaedically stable for discharge on 10/29/2018.    DIAGNOSTIC STUDIES:  RECENT RADIOGRAPHIC STUDIES :  No results found.  RECENT VITAL SIGNS:  No data found.Chelsea Mcintyre  RECENT EKG RESULTS:    Orders placed or performed during the hospital encounter of 10/20/16  . ED EKG  . ED EKG  . EKG 12-Lead  . EKG 12-Lead  . EKG  . EKG    DISCHARGE INSTRUCTIONS:    DISCHARGE MEDICATIONS:   Allergies as of 10/29/2018      Reactions   Sulfa Drugs Cross Reactors Shortness Of Breath, Rash   Gluten Meal Nausea Only   Bothers her stomach   Tape Other (See Comments)   Layer of skin peels off and area becomes raw   Latex Hives   Lisinopril Cough   Amlodipine Rash      Medication List    STOP taking these medications   traMADol 50 MG tablet Commonly known as: ULTRAM     TAKE these medications   calcium carbonate 1250 (500 Ca) MG tablet Commonly known as: OS-CAL - dosed in mg of elemental calcium Take 1 tablet by mouth daily.   cholecalciferol 1000 units tablet Commonly known as: VITAMIN D Take 1,000 Units by mouth daily.   clopidogrel 75 MG tablet Commonly known  as: PLAVIX Take 37.5 mg by mouth daily.   COD LIVER OIL PO Take 5 mLs by mouth daily. Take 1 teaspoon   ezetimibe 10 MG tablet Commonly known as: ZETIA Take 10 mg by mouth daily.   hydrochlorothiazide 12.5 MG capsule Commonly known as: MICROZIDE Take 1 capsule (12.5 mg total) by mouth daily.   HYDROcodone-acetaminophen 5-325 MG tablet Commonly known as: Norco Take 1 tablet by mouth every 6 (six) hours as needed for moderate pain.   losartan 25 MG tablet Commonly known as: COZAAR Take 25 mg by mouth See admin instructions. Take 1 tablet twice a day with meals. Take 1 tablet at bedtime as needed for "high blood pressure" - when dizzy, heart rate fast, and facial flushing.   methocarbamol 500 MG tablet Commonly known as: Robaxin Take 1 tablet (500 mg  total) by mouth every 8 (eight) hours as needed for muscle spasms.   multivitamins ther. w/minerals Tabs tablet Take 1 tablet by mouth daily.   nadolol 20 MG tablet Commonly known as: CORGARD Take 10 mg by mouth 2 (two) times daily with breakfast and lunch.   ondansetron 4 MG tablet Commonly known as: Zofran Take 1 tablet (4 mg total) by mouth every 8 (eight) hours as needed for nausea or vomiting.   Systane 0.4-0.3 % Soln Generic drug: Polyethyl Glycol-Propyl Glycol Place 2 drops into both eyes 3 (three) times daily as needed (dry eyes).   TURMERIC PO Take 1 tablet by mouth daily.   VITAMIN A PO Take 1 tablet by mouth daily.   VITAMIN C PO Take 1 tablet by mouth daily.       FOLLOW UP VISIT:   Follow-up Information    Chelsea Britain, MD.   Specialty: Orthopedic Surgery Why: call to be seen in 10-14 days Contact information: [address] [place] [address] [phone]           DISCHARGE [id number]   DISCHARGE CONDITION:  Chelsea Mcintyre for Dr. Justice Mcintyre 11/03/2018, 1:47 PM

## 2018-12-06 DIAGNOSIS — Z789 Other specified health status: Secondary | ICD-10-CM | POA: Insufficient documentation

## 2018-12-06 HISTORY — DX: Other specified health status: Z78.9

## 2019-03-15 DIAGNOSIS — N1831 Chronic kidney disease, stage 3a: Secondary | ICD-10-CM | POA: Insufficient documentation

## 2019-03-15 HISTORY — DX: Chronic kidney disease, stage 3a: N18.31

## 2019-04-04 ENCOUNTER — Other Ambulatory Visit: Payer: Self-pay | Admitting: Neurosurgery

## 2019-04-06 HISTORY — DX: Hypercalcemia: E83.52

## 2019-04-06 NOTE — Progress Notes (Signed)
[facility] [id number] Tia Alert, [address] AT [place] [id number] [place] [address] Phone: [phone] Fax: [phone]      Your procedure is scheduled on April 11, 2019.  Report to [facility] Main Entrance "A" at 8:20 A.M., and check in at the Admitting office.  Call this number if you have problems the morning of surgery:  [phone]  Call [phone] if you have any questions prior to your surgery date Monday-Friday 8am-4pm    Remember:  Do not eat or drink after midnight the night before your surgery    Take these medicines the morning of surgery with A SIP OF WATER: Apoaequorin (PREVAGEN PO) ezetimibe (ZETIA) HYDROcodone-acetaminophen ([place]) - if needed nadolol (CORGARD)   As of today, STOP taking any Aspirin (unless otherwise instructed by your surgeon), Aleve, Naproxen, Ibuprofen, Motrin, Advil, Goody's, BC's, all herbal medications, fish oil, and all vitamins.    The Morning of Surgery  Do not wear jewelry, make-up or nail polish.  Do not wear lotions, powders, or perfumes or deodorant  Do not shave 48 hours prior to surgery.    Do not bring valuables to the hospital.  [facility] is not responsible for any belongings or valuables.  If you are a smoker, DO NOT Smoke 24 hours prior to surgery  If you wear a CPAP at night please bring your mask, tubing, and machine the morning of surgery   Remember that you must have someone to transport you home after your surgery, and remain with you for 24 hours if you are discharged the same day.   Please bring cases for contacts, glasses, hearing aids, dentures or bridgework because it cannot be worn into surgery.    Leave your suitcase in the car.  After surgery it may be brought to your room.  For patients admitted to the hospital, discharge time will be determined by your treatment team.  Patients discharged the day of surgery will not be allowed to  drive home.    Special instructions:   [place]- Preparing For Surgery  Before surgery, you can play an important role. Because skin is not sterile, your skin needs to be as free of germs as possible. You can reduce the number of germs on your skin by washing with CHG (chlorahexidine gluconate) Soap before surgery.  CHG is an antiseptic cleaner which kills germs and bonds with the skin to continue killing germs even after washing.    Oral Hygiene is also important to reduce your risk of infection.  Remember - BRUSH YOUR TEETH THE MORNING OF SURGERY WITH YOUR REGULAR TOOTHPASTE  Please do not use if you have an allergy to CHG or antibacterial soaps. If your skin becomes reddened/irritated stop using the CHG.  Do not shave (including legs and underarms) for at least 48 hours prior to first CHG shower. It is OK to shave your face.  Please follow these instructions carefully.   1. Shower the NIGHT BEFORE SURGERY and the MORNING OF SURGERY with CHG Soap.   2. If you chose to wash your hair, wash your hair first as usual with your normal shampoo.  3. After you shampoo, rinse your hair and body thoroughly to remove the shampoo.  4. Use CHG as you would any other liquid soap. You can apply CHG directly to the skin and wash gently with a scrungie or a clean washcloth.   5. Apply the CHG  Soap to your body ONLY FROM THE NECK DOWN.  Do not use on open wounds or open sores. Avoid contact with your eyes, ears, mouth and genitals (private parts). Wash Face and genitals (private parts)  with your normal soap.   6. Wash thoroughly, paying special attention to the area where your surgery will be performed.  7. Thoroughly rinse your body with warm water from the neck down.  8. DO NOT shower/wash with your normal soap after using and rinsing off the CHG Soap.  9. Pat yourself dry with a CLEAN TOWEL.  10. Wear CLEAN PAJAMAS to bed the night before surgery, wear comfortable clothes the morning of  surgery  11. Place CLEAN SHEETS on your bed the night of your first shower and DO NOT SLEEP WITH PETS.    Day of Surgery:  Please shower the morning of surgery with the CHG soap Do not apply any deodorants/lotions. Please wear clean clothes to the hospital/surgery center.   Remember to brush your teeth WITH YOUR REGULAR TOOTHPASTE.   Please read over the following fact sheets that you were given.

## 2019-04-07 ENCOUNTER — Other Ambulatory Visit: Payer: Self-pay

## 2019-04-07 ENCOUNTER — Encounter (HOSPITAL_COMMUNITY): Payer: Self-pay

## 2019-04-07 ENCOUNTER — Encounter (HOSPITAL_COMMUNITY)
Admission: RE | Admit: 2019-04-07 | Discharge: 2019-04-07 | Disposition: A | Discharge status: Home / Self Care | Payer: Medicare Other | Source: Ambulatory Visit | Attending: Neurosurgery | Admitting: Neurosurgery

## 2019-04-07 ENCOUNTER — Other Ambulatory Visit (HOSPITAL_COMMUNITY)
Admission: RE | Admit: 2019-04-07 | Discharge: 2019-04-07 | Disposition: A | Discharge status: Home / Self Care | Payer: Medicare Other | Source: Ambulatory Visit | Attending: Neurosurgery | Admitting: Neurosurgery

## 2019-04-07 DIAGNOSIS — Z01818 Encounter for other preprocedural examination: Secondary | ICD-10-CM | POA: Insufficient documentation

## 2019-04-07 DIAGNOSIS — Z7902 Long term (current) use of antithrombotics/antiplatelets: Secondary | ICD-10-CM | POA: Diagnosis not present

## 2019-04-07 DIAGNOSIS — N183 Chronic kidney disease, stage 3 unspecified: Secondary | ICD-10-CM | POA: Diagnosis not present

## 2019-04-07 DIAGNOSIS — Z20828 Contact with and (suspected) exposure to other viral communicable diseases: Secondary | ICD-10-CM | POA: Insufficient documentation

## 2019-04-07 DIAGNOSIS — Z8673 Personal history of transient ischemic attack (TIA), and cerebral infarction without residual deficits: Secondary | ICD-10-CM | POA: Diagnosis not present

## 2019-04-07 LAB — CBC
HCT: 42.5 % (ref 36.0–46.0)
Hemoglobin: 14.1 g/dL (ref 12.0–15.0)
MCH: 31.3 pg (ref 26.0–34.0)
MCHC: 33.2 g/dL (ref 30.0–36.0)
MCV: 94.4 fL (ref 80.0–100.0)
Platelets: 246 10*3/uL (ref 150–400)
RBC: 4.5 MIL/uL (ref 3.87–5.11)
RDW: 12.9 % (ref 11.5–15.5)
WBC: 6.3 10*3/uL (ref 4.0–10.5)
nRBC: 0 % (ref 0.0–0.2)

## 2019-04-07 LAB — BASIC METABOLIC PANEL
Anion gap: 10 (ref 5–15)
BUN: 14 mg/dL (ref 8–23)
CO2: 26 mmol/L (ref 22–32)
Calcium: 9.2 mg/dL (ref 8.9–10.3)
Chloride: 101 mmol/L (ref 98–111)
Creatinine, Ser: 0.8 mg/dL (ref 0.44–1.00)
GFR calc Af Amer: >60 mL/min (ref >60)
GFR calc non Af Amer: >60 mL/min (ref >60)
Glucose, Bld: 86 mg/dL (ref 70–99)
Potassium: 3.6 mmol/L (ref 3.5–5.1)
Sodium: 137 mmol/L (ref 135–145)

## 2019-04-07 LAB — TYPE AND SCREEN
ABO/RH(D): A NEG
Antibody Screen: NEGATIVE

## 2019-04-07 LAB — SURGICAL PCR SCREEN
MRSA, PCR: NEGATIVE
Staphylococcus aureus: NEGATIVE

## 2019-04-07 NOTE — Progress Notes (Signed)
Covid test scheduled for today. No h/o symptoms concerning for Covid. No h/o recent travel. No h/o exposure to Covid positive patients.  PCP - Dr. Gilford Rile  Cardiologist - Dr Atilano Median, [place]  Chest x-ray - denies  EKG - today  Stress Test - 04-27-18  ECHO - 10-21-16  Cardiac Cath - 1994  AICD-denies PM-denies LOOP-denies  Sleep Study -denies  CPAP - NA  LABS-PCR,CBC,BMP,T/S  ASA-denies PLAVIX- LD 04/05/19  ERAS-NA  HA1C-denies Fasting Blood Sugar -  Checks Blood Sugar _____ times a day  Anesthesia-Y  H/o MVP,HTN,Dyrhythmia.  Pt denies having chest pain, sob, or fever at this time. All instructions explained to the pt, with a verbal understanding of the material. Pt agrees to go over the instructions while at home for a better understanding. Pt also instructed to self quarantine after being tested for COVID-19. The opportunity to ask questions was provided.

## 2019-04-07 NOTE — Progress Notes (Signed)
[facility] [id number] Tia Alert, [address] AT [place] [id number] [place] [address] Phone: [phone] Fax: [phone]      Your procedure is scheduled on April 11, 2019.  Report to [facility] Main Entrance "A" at 8:20 A.M., and check in at the Admitting office.  Call this number if you have problems the morning of surgery:  [phone]  Call [phone] if you have any questions prior to your surgery date Monday-Friday 8am-4pm    Remember:  Do not eat or drink after midnight the night before your surgery    Take these medicines the morning of surgery with A SIP OF WATER:  ezetimibe (ZETIA) HYDROcodone-acetaminophen ([place]) - if needed nadolol (CORGARD)   Follow your surgeon's instructions on when to stop PLAVIX.  If no instructions were given by your surgeon then you will need to call the office to get those instructions.   As of today, STOP taking any Aspirin (unless otherwise instructed by your surgeon), Aleve, Naproxen, Ibuprofen, Motrin, Advil, Goody's, BC's, all herbal medications, fish oil, and all vitamins.    The Morning of Surgery  Do not wear jewelry, make-up or nail polish.  Do not wear lotions, powders, or perfumes or deodorant  Do not shave 48 hours prior to surgery.    Do not bring valuables to the hospital.  [facility] is not responsible for any belongings or valuables.  If you are a smoker, DO NOT Smoke 24 hours prior to surgery  If you wear a CPAP at night please bring your mask, tubing, and machine the morning of surgery   Remember that you must have someone to transport you home after your surgery, and remain with you for 24 hours if you are discharged the same day.   Please bring cases for contacts, glasses, hearing aids, dentures or bridgework because it cannot be worn into surgery.    Leave your suitcase in the car.  After surgery it may be brought to your room.  For patients  admitted to the hospital, discharge time will be determined by your treatment team.  Patients discharged the day of surgery will not be allowed to drive home.    Special instructions:   [place]- Preparing For Surgery  Before surgery, you can play an important role. Because skin is not sterile, your skin needs to be as free of germs as possible. You can reduce the number of germs on your skin by washing with CHG (chlorahexidine gluconate) Soap before surgery.  CHG is an antiseptic cleaner which kills germs and bonds with the skin to continue killing germs even after washing.    Oral Hygiene is also important to reduce your risk of infection.  Remember - BRUSH YOUR TEETH THE MORNING OF SURGERY WITH YOUR REGULAR TOOTHPASTE  Please do not use if you have an allergy to CHG or antibacterial soaps. If your skin becomes reddened/irritated stop using the CHG.  Do not shave (including legs and underarms) for at least 48 hours prior to first CHG shower. It is OK to shave your face.  Please follow these instructions carefully.   1. Shower the NIGHT BEFORE SURGERY and the MORNING OF SURGERY with CHG Soap.   2. If you chose to wash your hair, wash your hair first as usual with your normal shampoo.  3. After you shampoo, rinse your hair and body thoroughly to remove the shampoo.  4. Use CHG as  you would any other liquid soap. You can apply CHG directly to the skin and wash gently with a scrungie or a clean washcloth.   5. Apply the CHG Soap to your body ONLY FROM THE NECK DOWN.  Do not use on open wounds or open sores. Avoid contact with your eyes, ears, mouth and genitals (private parts). Wash Face and genitals (private parts)  with your normal soap.   6. Wash thoroughly, paying special attention to the area where your surgery will be performed.  7. Thoroughly rinse your body with warm water from the neck down.  8. DO NOT shower/wash with your normal soap after using and rinsing off the CHG  Soap.  9. Pat yourself dry with a CLEAN TOWEL.  10. Wear CLEAN PAJAMAS to bed the night before surgery, wear comfortable clothes the morning of surgery  11. Place CLEAN SHEETS on your bed the night of your first shower and DO NOT SLEEP WITH PETS.    Day of Surgery:  Please shower the morning of surgery with the CHG soap Do not apply any deodorants/lotions. Please wear clean clothes to the hospital/surgery center.   Remember to brush your teeth WITH YOUR REGULAR TOOTHPASTE.   Please read over the fact sheets that you were given.

## 2019-04-08 LAB — NOVEL CORONAVIRUS, NAA (HOSP ORDER, SEND-OUT TO REF LAB; TAT 18-24 HRS): SARS-CoV-2, NAA: NOT DETECTED

## 2019-04-08 NOTE — Progress Notes (Signed)
Anesthesia Chart Review:  Seen by PCP 04/06/19, discussed upcoming surgery. "Patient has history of CVA for which she was taking the Plavix for daily. She also has a history of chronic kidney disease stage III. It was noted in her chart that she had a negative cardiac stress test in December of last year. Her ejection fraction was approximately 55%.Marland KitchenMarland KitchenPatient is denying any chest pains or shortness of breath. No nausea vomiting or diarrhea. No abdominal pain. No fevers or chills. The patient and I did discuss going off of her Plavix approximately 5 days prior to her procedure on Monday. Patient states she stopped taking it this morning."  Pt was seen by cardiology at [facility] 11/19 for preop risk stratification for shoulder surgery. She had a normal nuclear stress and was cleared for surgery. Advised to followup with PCP.  Preop labs reviewed, WNL.  EKG 04/07/19: Normal sinus rhythm. Rate 66. Normal ECG. No significant change since last tracing.  Stress Test 04/27/18 Impressions 1. Pharmacologic stress nuclear study is normal 2. No scar or ischemia detected by nuclear imaging. The gated study showed the ejection fraction was 55-60%.   Echo 10/21/16 Study Conclusions  - Left ventricle: The cavity size was normal. Wall thickness was increased in a pattern of mild LVH. There was focal basal hypertrophy. Systolic function was vigorous. The estimated ejection fraction was in the range of 65% to 70%. Features are consistent with a pseudonormal left ventricular filling pattern, with concomitant abnormal relaxation and increased filling pressure (grade 2 diastolic dysfunction). Doppler parameters are consistent with high ventricular filling pressure. - Mitral valve: Calcified annulus. Mildly thickened leaflets . - Right ventricle: The cavity size was normal. Wall thickness was normal. Systolic function was normal. - Atrial septum: Doppler and agitated saline contrast study  showed no atrial level shunt, in the baseline state. - Pericardium, extracardiac: A trivial pericardial effusion was identified posterior to the heart.   Wynonia Musty [facility] Short Stay Center/Anesthesiology Phone [phone] 04/08/2019 12:08 PM

## 2019-04-08 NOTE — Anesthesia Preprocedure Evaluation (Addendum)
Anesthesia Evaluation  Patient identified by MRN, date of birth, ID band Patient awake    Reviewed: Allergy & Precautions, H&P , NPO status , Patient's Chart, lab work & pertinent test results, reviewed documented beta blocker date and time   Airway Mallampati: III  TM Distance: >3 FB Neck ROM: Full    Dental no notable dental hx. (+) Teeth Intact, Dental Advisory Given   Pulmonary neg pulmonary ROS, former smoker,    Pulmonary exam normal breath sounds clear to auscultation       Cardiovascular hypertension, Pt. on medications and Pt. on home beta blockers + Valvular Problems/Murmurs MVP  Rhythm:Regular Rate:Normal     Neuro/Psych TIAnegative psych ROS   GI/Hepatic negative GI ROS, Neg liver ROS,   Endo/Other  negative endocrine ROS  Renal/GU negative Renal ROS  negative genitourinary   Musculoskeletal  (+) Arthritis , Osteoarthritis,    Abdominal   Peds  Hematology negative hematology ROS (+)   Anesthesia Other Findings   Reproductive/Obstetrics negative OB ROS                           Anesthesia Physical Anesthesia Plan  ASA: II  Anesthesia Plan: General   Post-op Pain Management:    Induction: Intravenous  PONV Risk Score and Plan: 4 or greater and Ondansetron, Dexamethasone and Treatment may vary due to age or medical condition  Airway Management Planned: Oral ETT  Additional Equipment:   Intra-op Plan:   Post-operative Plan: Extubation in OR  Informed Consent: I have reviewed the patients History and Physical, chart, labs and discussed the procedure including the risks, benefits and alternatives for the proposed anesthesia with the patient or authorized representative who has indicated his/her understanding and acceptance.     Dental advisory given  Plan Discussed with: CRNA  Anesthesia Plan Comments: (Seen by PCP 04/06/19, discussed upcoming surgery. "Patient has  history of CVA for which she was taking the Plavix for daily. She also has a history of chronic kidney disease stage III. It was noted in her chart that she had a negative cardiac stress test in December of last year. Her ejection fraction was approximately 55%.Marland KitchenMarland KitchenPatient is denying any chest pains or shortness of breath. No nausea vomiting or diarrhea. No abdominal pain. No fevers or chills. The patient and I did discuss going off of her Plavix approximately 5 days prior to her procedure on Monday. Patient states she stopped taking it this morning."  Pt was seen by cardiology at [facility] 11/19 for preop risk stratification for shoulder surgery. She had a normal nuclear stress and was cleared for surgery. Advised to followup with PCP.  Preop labs reviewed, WNL.  EKG 04/07/19: Normal sinus rhythm. Rate 66. Normal ECG. No significant change since last tracing.  Stress Test 04/27/18 Impressions 1. Pharmacologic stress nuclear study is normal 2. No scar or ischemia detected by nuclear imaging. The gated study showed the ejection fraction was 55-60%.   Echo 10/21/16 Study Conclusions  - Left ventricle: The cavity size was normal. Wall thickness was increased in a pattern of mild LVH. There was focal basal hypertrophy. Systolic function was vigorous. The estimated ejection fraction was in the range of 65% to 70%. Features are consistent with a pseudonormal left ventricular filling pattern, with concomitant abnormal relaxation and increased filling pressure (grade 2 diastolic dysfunction). Doppler parameters are consistent with high ventricular filling pressure. - Mitral valve: Calcified annulus. Mildly thickened leaflets . - Right ventricle: The  cavity size was normal. Wall thickness was normal. Systolic function was normal. - Atrial septum: Doppler and agitated saline contrast study showed no atrial level shunt, in the baseline state. - Pericardium, extracardiac: A trivial  pericardial effusion was identified posterior to the heart.)       Anesthesia Quick Evaluation

## 2019-04-11 ENCOUNTER — Ambulatory Visit (HOSPITAL_COMMUNITY)
Admission: RE | Admit: 2019-04-11 | Discharge: 2019-04-12 | Disposition: A | Discharge status: Home / Self Care | Payer: Medicare Other | Source: Ambulatory Visit | Attending: Neurosurgery | Admitting: Neurosurgery

## 2019-04-11 ENCOUNTER — Ambulatory Visit (HOSPITAL_COMMUNITY): Payer: Medicare Other | Admitting: Anesthesiology

## 2019-04-11 ENCOUNTER — Encounter (HOSPITAL_COMMUNITY)
Admission: RE | Disposition: A | Discharge status: Home / Self Care | Payer: Self-pay | Source: Ambulatory Visit | Attending: Neurosurgery

## 2019-04-11 ENCOUNTER — Ambulatory Visit (HOSPITAL_COMMUNITY): Payer: Medicare Other

## 2019-04-11 ENCOUNTER — Other Ambulatory Visit: Payer: Self-pay

## 2019-04-11 ENCOUNTER — Encounter (HOSPITAL_COMMUNITY): Payer: Self-pay

## 2019-04-11 ENCOUNTER — Ambulatory Visit (HOSPITAL_COMMUNITY): Payer: Medicare Other | Admitting: Physician Assistant

## 2019-04-11 DIAGNOSIS — I341 Nonrheumatic mitral (valve) prolapse: Secondary | ICD-10-CM | POA: Insufficient documentation

## 2019-04-11 DIAGNOSIS — S22000G Wedge compression fracture of unspecified thoracic vertebra, subsequent encounter for fracture with delayed healing: Secondary | ICD-10-CM

## 2019-04-11 DIAGNOSIS — I10 Essential (primary) hypertension: Secondary | ICD-10-CM | POA: Insufficient documentation

## 2019-04-11 DIAGNOSIS — Z79899 Other long term (current) drug therapy: Secondary | ICD-10-CM | POA: Insufficient documentation

## 2019-04-11 DIAGNOSIS — M1612 Unilateral primary osteoarthritis, left hip: Secondary | ICD-10-CM | POA: Insufficient documentation

## 2019-04-11 DIAGNOSIS — Z91040 Latex allergy status: Secondary | ICD-10-CM | POA: Insufficient documentation

## 2019-04-11 DIAGNOSIS — Z7902 Long term (current) use of antithrombotics/antiplatelets: Secondary | ICD-10-CM | POA: Diagnosis not present

## 2019-04-11 DIAGNOSIS — M4854XA Collapsed vertebra, not elsewhere classified, thoracic region, initial encounter for fracture: Secondary | ICD-10-CM | POA: Diagnosis not present

## 2019-04-11 DIAGNOSIS — Z882 Allergy status to sulfonamides status: Secondary | ICD-10-CM | POA: Insufficient documentation

## 2019-04-11 DIAGNOSIS — Z888 Allergy status to other drugs, medicaments and biological substances status: Secondary | ICD-10-CM | POA: Insufficient documentation

## 2019-04-11 DIAGNOSIS — M1611 Unilateral primary osteoarthritis, right hip: Secondary | ICD-10-CM | POA: Diagnosis not present

## 2019-04-11 DIAGNOSIS — Z419 Encounter for procedure for purposes other than remedying health state, unspecified: Secondary | ICD-10-CM

## 2019-04-11 HISTORY — DX: Wedge compression fracture of unspecified thoracic vertebra, subsequent encounter for fracture with delayed healing: S22.000G

## 2019-04-11 HISTORY — PX: KYPHOPLASTY: SHX5884

## 2019-04-11 SURGERY — KYPHOPLASTY
Anesthesia: General | Site: Spine Thoracic | Laterality: Bilateral

## 2019-04-11 MED ORDER — SODIUM CHLORIDE 0.9 % IV SOLN
250.0000 mL | INTRAVENOUS | Status: DC
  Administered 2019-04-11: 250 mL via INTRAVENOUS

## 2019-04-11 MED ORDER — LACTATED RINGERS IV SOLN
INTRAVENOUS | Status: DC
  Administered 2019-04-11: 08:00:00 via INTRAVENOUS

## 2019-04-11 MED ORDER — CEFAZOLIN SODIUM-DEXTROSE 2-4 GM/100ML-% IV SOLN
2.0000 g | Freq: Three times a day (TID) | INTRAVENOUS | Status: AC
  Administered 2019-04-11 (×2): 2 g via INTRAVENOUS
  Filled 2019-04-11 (×2): qty 100

## 2019-04-11 MED ORDER — CEFAZOLIN SODIUM-DEXTROSE 2-4 GM/100ML-% IV SOLN: 2.0000 g | INTRAVENOUS | Status: DC

## 2019-04-11 MED ORDER — ONDANSETRON HCL 4 MG/2ML IJ SOLN
INTRAMUSCULAR | Status: DC | PRN
  Administered 2019-04-11: 4 mg via INTRAVENOUS

## 2019-04-11 MED ORDER — LIDOCAINE 2% (20 MG/ML) 5 ML SYRINGE
INTRAMUSCULAR | Status: AC
  Filled 2019-04-11: qty 5

## 2019-04-11 MED ORDER — PROPOFOL 10 MG/ML IV BOLUS
INTRAVENOUS | Status: DC | PRN
  Administered 2019-04-11: 80 mg via INTRAVENOUS

## 2019-04-11 MED ORDER — DOCUSATE SODIUM 100 MG PO CAPS
100.0000 mg | ORAL_CAPSULE | Freq: Two times a day (BID) | ORAL | Status: DC
  Administered 2019-04-11: 100 mg via ORAL
  Filled 2019-04-11: qty 1

## 2019-04-11 MED ORDER — ACETAMINOPHEN 500 MG PO TABS
1000.0000 mg | ORAL_TABLET | Freq: Once | ORAL | Status: AC
  Administered 2019-04-11: 1000 mg via ORAL
  Filled 2019-04-11: qty 2

## 2019-04-11 MED ORDER — BISACODYL 10 MG RE SUPP: 10.0000 mg | Freq: Every day | RECTAL | Status: DC | PRN

## 2019-04-11 MED ORDER — CEFAZOLIN SODIUM-DEXTROSE 2-4 GM/100ML-% IV SOLN
INTRAVENOUS | Status: AC
  Filled 2019-04-11: qty 100

## 2019-04-11 MED ORDER — EPHEDRINE SULFATE-NACL 50-0.9 MG/10ML-% IV SOSY
PREFILLED_SYRINGE | INTRAVENOUS | Status: DC | PRN
  Administered 2019-04-11: 15 mg via INTRAVENOUS

## 2019-04-11 MED ORDER — ACETAMINOPHEN 500 MG PO TABS
1000.0000 mg | ORAL_TABLET | Freq: Four times a day (QID) | ORAL | Status: DC
  Administered 2019-04-11 – 2019-04-12 (×3): 1000 mg via ORAL
  Filled 2019-04-11 (×3): qty 2

## 2019-04-11 MED ORDER — BACITRACIN ZINC 500 UNIT/GM EX OINT
TOPICAL_OINTMENT | CUTANEOUS | Status: AC
  Filled 2019-04-11: qty 28.35

## 2019-04-11 MED ORDER — LACTATED RINGERS IV SOLN: INTRAVENOUS | Status: DC | PRN

## 2019-04-11 MED ORDER — 0.9 % SODIUM CHLORIDE (POUR BTL) OPTIME
TOPICAL | Status: DC | PRN
  Administered 2019-04-11: 1000 mL

## 2019-04-11 MED ORDER — CYCLOBENZAPRINE HCL 5 MG PO TABS: 5.0000 mg | ORAL_TABLET | Freq: Three times a day (TID) | ORAL | Status: DC | PRN

## 2019-04-11 MED ORDER — SUCCINYLCHOLINE CHLORIDE 200 MG/10ML IV SOSY
PREFILLED_SYRINGE | INTRAVENOUS | Status: DC | PRN
  Administered 2019-04-11: 80 mg via INTRAVENOUS

## 2019-04-11 MED ORDER — NADOLOL 20 MG PO TABS
10.0000 mg | ORAL_TABLET | Freq: Two times a day (BID) | ORAL | Status: DC
  Filled 2019-04-11 (×2): qty 1

## 2019-04-11 MED ORDER — SODIUM CHLORIDE 0.9% FLUSH
3.0000 mL | Freq: Two times a day (BID) | INTRAVENOUS | Status: DC
  Administered 2019-04-11: 3 mL via INTRAVENOUS

## 2019-04-11 MED ORDER — LOSARTAN POTASSIUM 25 MG PO TABS
12.5000 mg | ORAL_TABLET | Freq: Two times a day (BID) | ORAL | Status: DC
  Administered 2019-04-11: 12.5 mg via ORAL
  Filled 2019-04-11 (×3): qty 0.5

## 2019-04-11 MED ORDER — BUPIVACAINE-EPINEPHRINE 0.5% -1:200000 IJ SOLN
INTRAMUSCULAR | Status: AC
  Filled 2019-04-11: qty 1

## 2019-04-11 MED ORDER — ONDANSETRON HCL 4 MG/2ML IJ SOLN: 4.0000 mg | Freq: Four times a day (QID) | INTRAMUSCULAR | Status: DC | PRN

## 2019-04-11 MED ORDER — OXYCODONE HCL 5 MG PO TABS: 5.0000 mg | ORAL_TABLET | ORAL | Status: DC | PRN

## 2019-04-11 MED ORDER — IOPAMIDOL (ISOVUE-300) INJECTION 61%
INTRAVENOUS | Status: DC | PRN
  Administered 2019-04-11: 50 mL

## 2019-04-11 MED ORDER — ROCURONIUM BROMIDE 10 MG/ML (PF) SYRINGE
PREFILLED_SYRINGE | INTRAVENOUS | Status: AC
  Filled 2019-04-11: qty 10

## 2019-04-11 MED ORDER — ROCURONIUM BROMIDE 10 MG/ML (PF) SYRINGE
PREFILLED_SYRINGE | INTRAVENOUS | Status: DC | PRN
  Administered 2019-04-11: 30 mg via INTRAVENOUS

## 2019-04-11 MED ORDER — FENTANYL CITRATE (PF) 250 MCG/5ML IJ SOLN
INTRAMUSCULAR | Status: AC
  Filled 2019-04-11: qty 5

## 2019-04-11 MED ORDER — POLYVINYL ALCOHOL 1.4 % OP SOLN
1.0000 [drp] | OPHTHALMIC | Status: DC | PRN
  Filled 2019-04-11: qty 15

## 2019-04-11 MED ORDER — BACITRACIN ZINC 500 UNIT/GM EX OINT
TOPICAL_OINTMENT | CUTANEOUS | Status: DC | PRN
  Administered 2019-04-11: 1 via TOPICAL

## 2019-04-11 MED ORDER — PHENOL 1.4 % MT LIQD: 1.0000 | OROMUCOSAL | Status: DC | PRN

## 2019-04-11 MED ORDER — EZETIMIBE 10 MG PO TABS
10.0000 mg | ORAL_TABLET | Freq: Every day | ORAL | Status: DC
  Filled 2019-04-11: qty 1

## 2019-04-11 MED ORDER — DEXAMETHASONE SODIUM PHOSPHATE 10 MG/ML IJ SOLN
INTRAMUSCULAR | Status: AC
  Filled 2019-04-11: qty 1

## 2019-04-11 MED ORDER — SUCCINYLCHOLINE CHLORIDE 200 MG/10ML IV SOSY
PREFILLED_SYRINGE | INTRAVENOUS | Status: AC
  Filled 2019-04-11: qty 20

## 2019-04-11 MED ORDER — PHENYLEPHRINE 40 MCG/ML (10ML) SYRINGE FOR IV PUSH (FOR BLOOD PRESSURE SUPPORT)
PREFILLED_SYRINGE | INTRAVENOUS | Status: DC | PRN
  Administered 2019-04-11 (×2): 120 ug via INTRAVENOUS
  Administered 2019-04-11: 80 ug via INTRAVENOUS

## 2019-04-11 MED ORDER — BUPIVACAINE-EPINEPHRINE 0.5% -1:200000 IJ SOLN
INTRAMUSCULAR | Status: DC | PRN
  Administered 2019-04-11: 10 mL

## 2019-04-11 MED ORDER — ONDANSETRON HCL 4 MG PO TABS: 4.0000 mg | ORAL_TABLET | Freq: Four times a day (QID) | ORAL | Status: DC | PRN

## 2019-04-11 MED ORDER — DEXAMETHASONE SODIUM PHOSPHATE 10 MG/ML IJ SOLN
INTRAMUSCULAR | Status: DC | PRN
  Administered 2019-04-11: 4 mg via INTRAVENOUS

## 2019-04-11 MED ORDER — SUGAMMADEX SODIUM 200 MG/2ML IV SOLN
INTRAVENOUS | Status: DC | PRN
  Administered 2019-04-11: 140 mg via INTRAVENOUS

## 2019-04-11 MED ORDER — CHLORHEXIDINE GLUCONATE CLOTH 2 % EX PADS: 6.0000 | MEDICATED_PAD | Freq: Once | CUTANEOUS | Status: DC

## 2019-04-11 MED ORDER — MORPHINE SULFATE (PF) 4 MG/ML IV SOLN: 4.0000 mg | INTRAVENOUS | Status: DC | PRN

## 2019-04-11 MED ORDER — ACETAMINOPHEN 650 MG RE SUPP: 650.0000 mg | RECTAL | Status: DC | PRN

## 2019-04-11 MED ORDER — MENTHOL 3 MG MT LOZG: 1.0000 | LOZENGE | OROMUCOSAL | Status: DC | PRN

## 2019-04-11 MED ORDER — ONDANSETRON HCL 4 MG/2ML IJ SOLN
INTRAMUSCULAR | Status: AC
  Filled 2019-04-11: qty 2

## 2019-04-11 MED ORDER — POLYETHYL GLYCOL-PROPYL GLYCOL 0.4-0.3 % OP SOLN: 2.0000 [drp] | Freq: Three times a day (TID) | OPHTHALMIC | Status: DC | PRN

## 2019-04-11 MED ORDER — FENTANYL CITRATE (PF) 100 MCG/2ML IJ SOLN: 25.0000 ug | INTRAMUSCULAR | Status: DC | PRN

## 2019-04-11 MED ORDER — LIDOCAINE 2% (20 MG/ML) 5 ML SYRINGE
INTRAMUSCULAR | Status: DC | PRN
  Administered 2019-04-11: 60 mg via INTRAVENOUS

## 2019-04-11 MED ORDER — HYDROCODONE-ACETAMINOPHEN 5-325 MG PO TABS
1.0000 | ORAL_TABLET | ORAL | Status: DC | PRN
  Administered 2019-04-11: 1 via ORAL
  Filled 2019-04-11: qty 1

## 2019-04-11 MED ORDER — HYDROCHLOROTHIAZIDE 12.5 MG PO CAPS: 12.5000 mg | ORAL_CAPSULE | Freq: Every day | ORAL | Status: DC

## 2019-04-11 MED ORDER — ACETAMINOPHEN 325 MG PO TABS: 650.0000 mg | ORAL_TABLET | ORAL | Status: DC | PRN

## 2019-04-11 MED ORDER — FENTANYL CITRATE (PF) 100 MCG/2ML IJ SOLN
INTRAMUSCULAR | Status: DC | PRN
  Administered 2019-04-11: 50 ug via INTRAVENOUS

## 2019-04-11 MED ORDER — ZOLPIDEM TARTRATE 5 MG PO TABS: 5.0000 mg | ORAL_TABLET | Freq: Every evening | ORAL | Status: DC | PRN

## 2019-04-11 MED ORDER — OXYCODONE HCL 5 MG PO TABS: 10.0000 mg | ORAL_TABLET | ORAL | Status: DC | PRN

## 2019-04-11 MED ORDER — SODIUM CHLORIDE 0.9% FLUSH: 3.0000 mL | INTRAVENOUS | Status: DC | PRN

## 2019-04-11 SURGICAL SUPPLY — 42 items
BENZOIN TINCTURE PRP APPL 2/3 (GAUZE/BANDAGES/DRESSINGS) ×3 IMPLANT
BLADE SURG 15 STRL LF DISP TIS (BLADE) ×1 IMPLANT
BLADE SURG 15 STRL SS (BLADE) ×2
CARTRIDGE OIL MAESTRO DRILL (MISCELLANEOUS) ×1 IMPLANT
CEMENT KYPHON CX01A KIT/MIXER (Cement) ×2 IMPLANT
CLOSURE WOUND 1/2 X4 (GAUZE/BANDAGES/DRESSINGS) ×1
COVER WAND RF STERILE (DRAPES) ×3 IMPLANT
DIFFUSER DRILL AIR PNEUMATIC (MISCELLANEOUS) ×3 IMPLANT
DRAPE C-ARM 42X72 X-RAY (DRAPES) ×3 IMPLANT
DRAPE HALF SHEET 40X57 (DRAPES) ×3 IMPLANT
DRAPE INCISE IOBAN 66X45 STRL (DRAPES) ×3 IMPLANT
DRAPE LAPAROTOMY 100X72X124 (DRAPES) ×3 IMPLANT
DRAPE SURG 17X23 STRL (DRAPES) ×12 IMPLANT
DRAPE WARM FLUID 44X44 (DRAPES) ×3 IMPLANT
DRSG OPSITE POSTOP 4X6 (GAUZE/BANDAGES/DRESSINGS) ×2 IMPLANT
GAUZE 4X4 16PLY RFD (DISPOSABLE) ×3 IMPLANT
GAUZE SPONGE 4X4 12PLY STRL (GAUZE/BANDAGES/DRESSINGS) ×3 IMPLANT
GLOVE BIOGEL PI IND STRL 7.0 (GLOVE) IMPLANT
GLOVE BIOGEL PI IND STRL 7.5 (GLOVE) IMPLANT
GLOVE BIOGEL PI INDICATOR 7.0 (GLOVE) ×6
GLOVE BIOGEL PI INDICATOR 7.5 (GLOVE) ×6
GLOVE SS N UNI LF 7.0 STRL (GLOVE) ×6 IMPLANT
GLOVE SS N UNI LF 7.5 STRL (GLOVE) ×6 IMPLANT
GLOVE SS N UNI LF 8.0 STRL (GLOVE) ×6 IMPLANT
GLOVE SS N UNI LF 8.5 STRL (GLOVE) ×6 IMPLANT
GOWN STRL REUS W/ TWL LRG LVL3 (GOWN DISPOSABLE) IMPLANT
GOWN STRL REUS W/ TWL XL LVL3 (GOWN DISPOSABLE) IMPLANT
GOWN STRL REUS W/TWL LRG LVL3 (GOWN DISPOSABLE) ×4
GOWN STRL REUS W/TWL XL LVL3 (GOWN DISPOSABLE) ×2
KIT BASIN OR (CUSTOM PROCEDURE TRAY) ×3 IMPLANT
KIT TURNOVER KIT B (KITS) ×3 IMPLANT
NEEDLE HYPO 22GX1.5 SAFETY (NEEDLE) ×3 IMPLANT
NS IRRIG 1000ML POUR BTL (IV SOLUTION) ×3 IMPLANT
OIL CARTRIDGE MAESTRO DRILL (MISCELLANEOUS) ×3
PACK EENT II TURBAN DRAPE (CUSTOM PROCEDURE TRAY) ×3 IMPLANT
PAD ARMBOARD 7.5X6 YLW CONV (MISCELLANEOUS) ×9 IMPLANT
STRIP CLOSURE SKIN 1/2X4 (GAUZE/BANDAGES/DRESSINGS) ×2 IMPLANT
SUT VIC AB 3-0 SH 8-18 (SUTURE) ×5 IMPLANT
SYR CONTROL 10ML LL (SYRINGE) ×3 IMPLANT
TOWEL GREEN STERILE (TOWEL DISPOSABLE) ×3 IMPLANT
TOWEL GREEN STERILE FF (TOWEL DISPOSABLE) ×3 IMPLANT
TRAY KYPHOPAK 15/2 EXPRESS (KITS) ×4 IMPLANT

## 2019-04-11 NOTE — Transfer of Care (Signed)
Immediate Anesthesia Transfer of Care Note  Patient: Chelsea Mcintyre  Procedure(s) Performed: KYPHOPLASTY BILATERAL, THORACIC SEVEN- THORACIC EIGHT (Bilateral Spine Thoracic)  Patient Location: PACU  Anesthesia Type:General  Level of Consciousness: awake, alert  and oriented  Airway & Oxygen Therapy: Patient Spontanous Breathing and Patient connected to nasal cannula oxygen  Post-op Assessment: Report given to RN, Post -op Vital signs reviewed and stable and Patient moving all extremities  Post vital signs: Reviewed and stable  Last Vitals:  Vitals Value Taken Time  BP 169/82 04/11/19 1202  Temp    Pulse 63 04/11/19 1203  Resp 17 04/11/19 1203  SpO2 100 % 04/11/19 1203  Vitals shown include unvalidated device data.  Last Pain:  Vitals:   04/11/19 0833  PainSc: 5          Complications: No apparent anesthesia complications

## 2019-04-11 NOTE — Progress Notes (Signed)
Subjective: The patient is somnolent but easily arousable.  She is in no apparent distress.  She looks well.  Objective: Vital signs in last 24 hours: Temp:  [97.9 F (36.6 C)-98 F (36.7 C)] (P) 98 F (36.7 C) (11/23 1202) Pulse Rate:  [63-67] 63 (11/23 1203) Resp:  [12-17] 17 (11/23 1203) BP: (162-169)/(82-90) 169/82 (11/23 1202) SpO2:  [99 %-100 %] 100 % (11/23 1203) Weight:  [69.6 kg] 69.6 kg (11/23 0833) Estimated body mass index is 25.53 kg/m as calculated from the following:   Height as of this encounter: 5\' 5"  (1.651 m).   Weight as of this encounter: 69.6 kg.   Intake/Output from previous day: No intake/output data recorded. Intake/Output this shift: Total I/O In: 900 [I.V.:900] Out: 5 [Blood:5]  Physical exam the patient is somnolent but arousable.  She is moving her lower extremities well.  Lab Results: No results for input(s): WBC, HGB, HCT, PLT in the last 72 hours. BMET No results for input(s): NA, K, CL, CO2, GLUCOSE, BUN, CREATININE, CALCIUM in the last 72 hours.  Studies/Results: No results found.  Assessment/Plan: The patient is doing well.  I spoke with her daughter.  LOS: 0 days     Ophelia Charter 04/11/2019, 12:08 PM

## 2019-04-11 NOTE — Anesthesia Postprocedure Evaluation (Signed)
Anesthesia Post Note  Patient: Chelsea Mcintyre  Procedure(s) Performed: KYPHOPLASTY BILATERAL, THORACIC SEVEN- THORACIC EIGHT (Bilateral Spine Thoracic)     Patient location during evaluation: PACU Anesthesia Type: General Level of consciousness: awake and alert Pain management: pain level controlled Vital Signs Assessment: post-procedure vital signs reviewed and stable Respiratory status: spontaneous breathing, nonlabored ventilation and respiratory function stable Cardiovascular status: blood pressure returned to baseline and stable Postop Assessment: no apparent nausea or vomiting Anesthetic complications: no    Last Vitals:  Vitals:   04/11/19 1300 04/11/19 1302  BP:  (!) 168/84  Pulse: 61 61  Resp: 14 10  Temp:    SpO2: 100% 100%    Last Pain:  Vitals:   04/11/19 1300  PainSc: 0-No pain      LLE Sensation: Full sensation (04/11/19 1300)   RLE Sensation: Full sensation (04/11/19 1300)      Giah Fickett,W. EDMOND

## 2019-04-11 NOTE — Op Note (Signed)
Brief history: The patient is an 85 year old white female who has complained of thoracic spine pain.  She failed medical management and was worked up with thoracic x-rays and a thoracic MRI which demonstrated a T7 and T8 compression fractures.  I discussed the various treatment options with her.  She has decided to proceed with T7 and T8 kyphoplasty's after weighing the risks, benefits and alternatives to surgery.  Preop diagnosis: T7 and T8 compression fracture, thoracic spine pain  Postop diagnosis: The same  Procedure: T7 and T8 kyphoplasty's  Surgeon: Dr. Earle Gell  Assistant: None  Anesthesia: General endotracheal  Estimated blood loss: Minimal  Specimens: None  Drains: None  Complications: None  Description of procedure: The patient was brought to the operating room by the anesthesia team.  General endotracheal anesthesia was induced.  She was turned to the prone position on the chest rolls.  Her thoracic region was prepared with DuraPrep.  Sterile drapes were applied.  I then injected the area to be incised with Marcaine with epinephrine solution.  I should scalpel to make small incisions of the bilateral T7 and T8 pedicles.  Under biplanar fluoroscopy I cannulated the bilateral T7 and T8 pedicles with the Jamshidi needle.  I then remove the stylette and drilled through the cannulas under fluoroscopic guidance.  I then remove the drills.  I inserted balloons, inflated balloons under fluoroscopic guidance,deflated the balloons, and remove the balloons.  I then injected bone cement through the cannulas under biplanar fluoroscopy.  After I was satisfied with fill we remove the cannulas.  I reapproximated the patient's subcutaneous tissue with interrupted 3-0 Vicryl suture.  The skin with Steri-Strips and benzoin.  The wound was then coated with bacitracin ointment.  A sterile dressing was applied.  The drapes were removed.  By report all sponge, instrument, and needle counts were correct  at the end of this case.

## 2019-04-11 NOTE — Anesthesia Procedure Notes (Signed)
Procedure Name: Intubation Date/Time: 04/11/2019 10:31 AM Performed by: Leonor Liv, CRNA Pre-anesthesia Checklist: Patient identified, Emergency Drugs available, Suction available and Patient being monitored Patient Re-evaluated:Patient Re-evaluated prior to induction Oxygen Delivery Method: Circle System Utilized Preoxygenation: Pre-oxygenation with 100% oxygen Induction Type: IV induction Ventilation: Mask ventilation without difficulty Laryngoscope Size: Mac and 3 Grade View: Grade II Tube type: Oral Tube size: 7.0 mm Number of attempts: 1 Airway Equipment and Method: Stylet and Oral airway Placement Confirmation: ETT inserted through vocal cords under direct vision,  positive ETCO2 and breath sounds checked- equal and bilateral Secured at: 21 cm Tube secured with: Tape Dental Injury: Teeth and Oropharynx as per pre-operative assessment

## 2019-04-11 NOTE — H&P (Signed)
Subjective: The patient is an 85 year old white female who has complained of midthoracic pain.  She has failed medical management.  She was worked up with x-rays and a thoracic MRI which demonstrated an acute T7 and T8 compression fracture.  I discussed the various treatment options with her.  She has decided to proceed with kyphoplasty's.  Past Medical History:  Diagnosis Date  . Arthritis    bilateral hips and bilateral shoulders.  . Cancer ([place])    skin back, nose  . Complication of anesthesia    heart stopped with the anesthesia x1,  difficult to wake up  . Dysrhythmia    if taking certain medications  . Family history of adverse reaction to anesthesia    sister slow to awaken- "almost died last time"  . Frequency of urination   . Hypertension   . Jaundice    as a child  . MVP (mitral valve prolapse)   . Pneumonia 09   hx  . 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 Recurrent upper respiratory infection (URI)    Dx 1st of september with sinus infection, placed on antibiotics at that time    Past Surgical History:  Procedure Laterality Date  . ABDOMINAL HYSTERECTOMY  75   partial  . APPENDECTOMY    . BACK SURGERY  2012   kyphoplasty  . BREAST SURGERY     1982,70rt  . CARDIAC CATHETERIZATION     1994, detected MVP  . ELBOW FRACTURE SURGERY Left 98,   hardware removed later in year  . EYE SURGERY     bilateral cataract surgery 2007 with lens implant  . fibroid cyst     ovary right removed 1966  . fibroid tumor     1970, right breast removed  . FRACTURE SURGERY     1988 shattered elbow left  . HARDWARE REMOVAL     left elbow 1998  . REVERSE SHOULDER ARTHROPLASTY Right 10/28/2018   Procedure: REVERSE SHOULDER ARTHROPLASTY;  Surgeon: Justice Britain, MD;  Location: [facility];  Service: Orthopedics;  Laterality: Right;  176min  . TONSILLECTOMY  44   and adenoids  . TOTAL HIP ARTHROPLASTY Left 05/15/2015   Procedure: LEFT TOTAL HIP  ARTHROPLASTY ANTERIOR APPROACH;  Surgeon: Rod Can, MD;  Location: [facility];  Service: Orthopedics;  Laterality: Left;    Allergies  Allergen Reactions  . Sulfa Drugs Cross Reactors Shortness Of Breath and Rash  . Gluten Meal Nausea Only    Bothers her stomach   . Tape Other (See Comments)    Layer of skin peels off and area becomes raw  . Latex Hives  . Lisinopril Cough  . Amlodipine Rash    Social History   Tobacco Use  . Smoking status: Former Smoker    Packs/day: 0.50    Years: 2.00    Pack years: 1.00  . Smokeless tobacco: Never Used  . Tobacco comment: off and on, non smoker presently  Substance Use Topics  . Alcohol use: No    Family History  Problem Relation Age of Onset  . Anesthesia problems Father    Prior to Admission medications   Medication Sig Start Date End Date Taking? Authorizing Provider  Apoaequorin (PREVAGEN PO) Take 1 tablet by mouth daily.   Yes [provider]  Ascorbic Acid (VITAMIN C) 1000 MG tablet Take 1,000 mg by mouth daily.   Yes [provider]  Beta Carotene (VITAMIN A)  25000 UNIT capsule Take 25,000 Units by mouth daily.   Yes [provider]  Calcium-Magnesium-Zinc (CAL-MAG-ZINC PO) Take 2 tablets by mouth daily.   Yes [provider]  Cholecalciferol (VITAMIN D3) 250 MCG (10000 UT) TABS Take 10,000 Units by mouth daily.   Yes [provider]  clopidogrel (PLAVIX) 75 MG tablet Take 37.5 mg by mouth daily.   Yes [provider]  Digestive Enzymes (MULTI-ENZYME PO) Take 1 capsule by mouth daily.   Yes [provider]  ezetimibe (ZETIA) 10 MG tablet Take 10 mg by mouth daily.   Yes [provider]  GLUCOSAMINE-CHONDROITIN-MSM PO Take 1 tablet by mouth 2 (two) times daily.   Yes [provider]  hydrochlorothiazide (MICROZIDE) 12.5 MG capsule Take 1 capsule (12.5 mg total) by mouth daily. 10/22/16  Yes Hillary Bow, MD  HYDROcodone-acetaminophen (NORCO) 5-325 MG  tablet Take 1 tablet by mouth every 6 (six) hours as needed for moderate pain. Patient taking differently: Take 0.5-1 tablets by mouth 3 (three) times daily as needed (severe pain. (typically at bedtime)).  10/29/18  Yes Shuford, Olivia Mackie, PA-C  Liniments (BLUE-EMU SUPER STRENGTH EX) Apply 1 application topically 4 (four) times daily as needed (pain.).    Yes [provider]  losartan (COZAAR) 25 MG tablet Take 12.5 mg by mouth 2 (two) times daily.  09/28/16  Yes [provider]  Multiple Vitamin (MULTIVITAMIN WITH MINERALS) TABS tablet Take 1 tablet by mouth daily. Women's Multivitamin   Yes [provider]  nadolol (CORGARD) 20 MG tablet Take 10 mg by mouth 2 (two) times daily with breakfast and lunch.    Yes [provider]  Omega 3-6-9 Fatty Acids (TRIPLE OMEGA-3-6-9) CAPS Take 1 capsule by mouth daily.   Yes [provider]  Polyethyl Glycol-Propyl Glycol (SYSTANE) 0.4-0.3 % SOLN Place 2 drops into both eyes 3 (three) times daily as needed (dry eyes).    Yes [provider]  TURMERIC PO Take 1 tablet by mouth daily.    Yes [provider]  vitamin E 1000 UNIT capsule Take 1,000 Units by mouth daily.   Yes [provider]  amoxicillin (AMOXIL) 500 MG capsule Take 2,000 mg by mouth See admin instructions. Take 4 capsules (2000 mg) by mouth 1 hour prior to dental work    [provider]  COD LIVER OIL PO Take 5 mLs by mouth daily. Take 1 teaspoon    [provider]  methocarbamol (ROBAXIN) 500 MG tablet Take 1 tablet (500 mg total) by mouth every 8 (eight) hours as needed for muscle spasms. Patient not taking: Reported on 04/04/2019 10/29/18   Shuford, Olivia Mackie, PA-C  ondansetron (ZOFRAN) 4 MG tablet Take 1 tablet (4 mg total) by mouth every 8 (eight) hours as needed for nausea or vomiting. Patient not taking: Reported on 04/04/2019 10/29/18   Shuford, Olivia Mackie, PA-C     Review of Systems  Positive ROS: As above  All  other systems have been reviewed and were otherwise negative with the exception of those mentioned in the HPI and as above.  Objective: Vital signs in last 24 hours: Temp:  [97.9 F (36.6 C)] 97.9 F (36.6 C) (11/23 0815) Pulse Rate:  [67] 67 (11/23 0815) Resp:  [17] 17 (11/23 0815) BP: (162)/(90) 162/90 (11/23 0815) SpO2:  [100 %] 100 % (11/23 0815) Weight:  [69.6 kg] 69.6 kg (11/23 0833) Estimated body mass index is 25.53 kg/m as calculated from the following:   Height as of this  encounter: 5\' 5"  (1.651 m).   Weight as of this encounter: 69.6 kg.   General Appearance: Alert Head: Normocephalic, without obvious abnormality, atraumatic Eyes: PERRL, conjunctiva/corneas clear, EOM's intact,    Ears: Normal  Throat: Normal  Neck: Supple, Back: unremarkable Lungs: Clear to auscultation bilaterally, respirations unlabored Heart: Regular rate and rhythm, no murmur, rub or gallop Abdomen: Soft, non-tender Extremities: Extremities normal, atraumatic, no cyanosis or edema Skin: unremarkable  NEUROLOGIC:   Mental status: alert and oriented,Motor Exam - grossly normal Sensory Exam - grossly normal Reflexes:  Coordination - grossly normal Gait - grossly normal Balance - grossly normal Cranial Nerves: I: smell Not tested  II: visual acuity  OS: Normal  OD: Normal   II: visual fields Full to confrontation  II: pupils Equal, round, reactive to light  III,VII: ptosis None  III,IV,VI: extraocular muscles  Full ROM  V: mastication Normal  V: facial light touch sensation  Normal  V,VII: corneal reflex  Present  VII: facial muscle function - upper  Normal  VII: facial muscle function - lower Normal  VIII: hearing Not tested  IX: soft palate elevation  Normal  IX,X: gag reflex Present  XI: trapezius strength  5/5  XI: sternocleidomastoid strength 5/5  XI: neck flexion strength  5/5  XII: tongue strength  Normal    Data Review Lab Results  Component Value Date   WBC 6.3  04/07/2019   HGB 14.1 04/07/2019   HCT 42.5 04/07/2019   MCV 94.4 04/07/2019   PLT 246 04/07/2019   Lab Results  Component Value Date   NA 137 04/07/2019   K 3.6 04/07/2019   CL 101 04/07/2019   CO2 26 04/07/2019   BUN 14 04/07/2019   CREATININE 0.80 04/07/2019   GLUCOSE 86 04/07/2019   Lab Results  Component Value Date   INR 1.08 10/20/2016    Assessment/Plan: T7 and T8 compression fracture, thoracic spine pain: I have discussed the situation with the patient.  I have reviewed her imaging studies with her and pointed out the abnormalities.  We have discussed the various treatment options including surgery.  I have described the surgical treatment option of a T7 and T8 kyphoplasty.  I have shown her surgical models.  I have given her a surgical pamphlet.  We have discussed the risks, benefits, alternatives, expected postoperative course, and likelihood of achieving our goals with surgery.  I have answered all her questions.  She has decided to proceed with a T7 and T8 kyphoplasty.   Ophelia Charter 04/11/2019 9:49 AM

## 2019-04-12 ENCOUNTER — Encounter (HOSPITAL_COMMUNITY): Payer: Self-pay | Admitting: Neurosurgery

## 2019-04-12 DIAGNOSIS — M4854XA Collapsed vertebra, not elsewhere classified, thoracic region, initial encounter for fracture: Secondary | ICD-10-CM | POA: Diagnosis not present

## 2019-04-12 MED ORDER — HYDROCODONE-ACETAMINOPHEN 5-325 MG PO TABS: 1.0000 | ORAL_TABLET | ORAL | 0 refills | Status: DC | PRN

## 2019-04-12 MED ORDER — DOCUSATE SODIUM 100 MG PO CAPS: 100.0000 mg | ORAL_CAPSULE | Freq: Two times a day (BID) | ORAL | 0 refills | Status: DC

## 2019-04-12 NOTE — Evaluation (Signed)
Occupational Therapy Evaluation Patient Details Name: Chelsea Mcintyre MRN: [id number] DOB: Dec 10, 1932 Today's Date: 04/12/2019    History of Present Illness 85 yo female s/p T7-8 kyphoplasty for T7-8 compression fx after fall.   Clinical Impression   PTA, pt was living alone and was independent and using RW after recent fall. Pt currently requiring Supervision for ADLs and functional mobility with RW. Provided education and handout on back precautions, bed mobility, grooming, LB ADLs, toileting, and shower transfer. Pt verbalized and demonstrated understanding. Recommend dc home once medically stable per physician. All acute OT needs met and will sign off. Thank you.      No OT follow up;Supervision/Assistance - 24 hour    Equipment Recommendations  None recommended by OT    Recommendations for Other Services PT consult     Precautions / Restrictions Precautions Precautions: Fall;Back Precaution Booklet Issued: Yes (comment) Precaution Comments: Reviewed back precautions and handout in full Restrictions Weight Bearing Restrictions: No      Mobility Bed Mobility               General bed mobility comments: Pt sitting at EOB upon arrival. Reviewed bed mobility and log roll technique; pt verablized understanding  Transfers Overall transfer level: Needs assistance Equipment used: None Transfers: Sit to/from Stand Sit to Stand: Supervision         General transfer comment: Supervision for safety    Balance Overall balance assessment: Mild deficits observed, not formally tested                                         ADL either performed or assessed with clinical judgement   ADL Overall ADL's : Needs assistance/impaired                                       General ADL Comments: Pt performing ADLs and functional mobility at Supervision level. Provided pt with handout and education on back precautions, bed mobility, LB ADLs,  toileting, oral care, and shower transfer. Pt demonstrating and verablizing understanding     Vision         Perception     Praxis      Pertinent Vitals/Pain Pain Assessment: Faces Faces Pain Scale: Hurts little more Pain Location: Back Pain Descriptors / Indicators: Sore Pain Intervention(s): Monitored during session;Limited activity within patient's tolerance;Repositioned     Hand Dominance Left   Extremity/Trunk Assessment Upper Extremity Assessment Upper Extremity Assessment: Overall WFL for tasks assessed   Lower Extremity Assessment Lower Extremity Assessment: Defer to PT evaluation   Cervical / Trunk Assessment Cervical / Trunk Assessment: Other exceptions Cervical / Trunk Exceptions: S/p back sx   Communication Communication Communication: No difficulties   Cognition Arousal/Alertness: Awake/alert Behavior During Therapy: WFL for tasks assessed/performed Overall Cognitive Status: Within Functional Limits for tasks assessed                                     General Comments       Exercises     Shoulder Instructions      Home Living Family/patient expects to be discharged to:: Private residence Living Arrangements: Alone Available Help at Discharge: Family;Available PRN/intermittently Type of Home: House Home Access: Stairs to  enter Entrance Stairs-Number of Steps: 4 back door, which she normally uses Entrance Stairs-Rails: Can reach both Home Layout: Multi-level;Able to live on main level with bedroom/bathroom(Sewing room on second floor. doesnlt need to go to basement)     Bathroom Shower/Tub: Occupational psychologist: Handicapped height     Home Equipment: Clinical cytogeneticist - 2 wheels          Prior Functioning/Environment Level of Independence: Independent        Comments: ADLs and IADLs        OT Problem List: Decreased activity tolerance;Impaired balance (sitting and/or standing);Decreased knowledge  of precautions;Decreased knowledge of use of DME or AE      OT Treatment/Interventions:      OT Goals(Current goals can be found in the care plan section) Acute Rehab OT Goals Patient Stated Goal: "Go home later today" OT Goal Formulation: All assessment and education complete, DC therapy  OT Frequency:     Barriers to D/C:            Co-evaluation              AM-PAC OT "6 Clicks" Daily Activity     Outcome Measure Help from another person eating meals?: None Help from another person taking care of personal grooming?: None Help from another person toileting, which includes using toliet, bedpan, or urinal?: None Help from another person bathing (including washing, rinsing, drying)?: None Help from another person to put on and taking off regular upper body clothing?: None Help from another person to put on and taking off regular lower body clothing?: None 6 Click Score: 24   End of Session Equipment Utilized During Treatment: Rolling walker Nurse Communication: Mobility status;Precautions  Activity Tolerance: Patient tolerated treatment well Patient left: (in hallway with PT)  OT Visit Diagnosis: Unsteadiness on feet (R26.81);Other abnormalities of gait and mobility (R26.89);Muscle weakness (generalized) (M62.81)                Time: 5615-3794 OT Time Calculation (min): 12 min Charges:  OT General Charges $OT Visit: 1 Visit OT Evaluation $OT Eval Low Complexity: Leigh, OTR/L Acute Rehab Pager: [phone] Office: [place] 04/12/2019, 8:20 AM

## 2019-04-12 NOTE — Discharge Summary (Signed)
Physician Discharge Summary  Patient ID: Chelsea Mcintyre MRN: [id number] DOB/AGE: 01-05-1933 85 y.o.  Admit date: 04/11/2019 Discharge date: 04/12/2019  Admission Diagnoses: T7 and T8 compression fracture, thoracic spine pain  Discharge Diagnoses: The same Active Problems:   Thoracic compression fracture, with delayed healing, subsequent encounter   Discharged Condition: good  Hospital Course: I performed a T7 and T8 kyphoplasty on the patient on 04/11/2019.  The surgery went well.  The patient's postoperative course was unremarkable.  On postoperative day #1 she requested discharge to home.  She was given written and oral discharge instructions.  All her questions were answered.  Consults: PT, OT, care management Significant Diagnostic Studies: None Treatments: T7 and T8 kyphoplasty Discharge Exam: Blood pressure (!) 121/46, pulse 72, temperature 98 F (36.7 C), temperature source Oral, resp. rate 16, height 5\' 5"  (1.651 m), weight 69.6 kg, SpO2 97 %. The patient is alert and pleasant.  Her strength is normal.  Her dressing has a small old bloodstain.  Disposition: Home  Discharge Instructions    Call MD for:  difficulty breathing, headache or visual disturbances   Complete by: As directed    Call MD for:  extreme fatigue   Complete by: As directed    Call MD for:  hives   Complete by: As directed    Call MD for:  persistant dizziness or light-headedness   Complete by: As directed    Call MD for:  persistant nausea and vomiting   Complete by: As directed    Call MD for:  redness, tenderness, or signs of infection (pain, swelling, redness, odor or green/yellow discharge around incision site)   Complete by: As directed    Call MD for:  severe uncontrolled pain   Complete by: As directed    Call MD for:  temperature >100.4   Complete by: As directed    Diet - low sodium heart healthy   Complete by: As directed    Discharge instructions   Complete by: As directed    Call [phone] for a followup appointment. Take a stool softener while you are using pain medications.   Driving Restrictions   Complete by: As directed    Do not drive for 2 weeks.   Increase activity slowly   Complete by: As directed    Lifting restrictions   Complete by: As directed    Do not lift more than 5 pounds. No excessive bending or twisting.   May shower / Bathe   Complete by: As directed    Remove the dressing for 3 days after surgery.  You may shower, but leave the incision alone.   Remove dressing in 48 hours   Complete by: As directed    Your stitches are under the scan and will dissolve by themselves. The Steri-Strips will fall off after you take a few showers. Do not rub back or pick at the wound, Leave the wound alone.     Allergies as of 04/12/2019      Reactions   Sulfa Drugs Cross Reactors Shortness Of Breath, Rash   Gluten Meal Nausea Only   Bothers her stomach   Tape Other (See Comments)   Layer of skin peels off and area becomes raw   Latex Hives   Lisinopril Cough   Amlodipine Rash      Medication List    TAKE these medications   amoxicillin 500 MG capsule Commonly known as: AMOXIL Take 2,000 mg by mouth See admin instructions. Take 4  capsules (2000 mg) by mouth 1 hour prior to dental work   Mobridge 1 application topically 4 (four) times daily as needed (pain.).   CAL-MAG-ZINC PO Take 2 tablets by mouth daily.   clopidogrel 75 MG tablet Commonly known as: PLAVIX Take 37.5 mg by mouth daily.   COD LIVER OIL PO Take 5 mLs by mouth daily. Take 1 teaspoon   docusate sodium 100 MG capsule Commonly known as: COLACE Take 1 capsule (100 mg total) by mouth 2 (two) times daily.   ezetimibe 10 MG tablet Commonly known as: ZETIA Take 10 mg by mouth daily.   GLUCOSAMINE-CHONDROITIN-MSM PO Take 1 tablet by mouth 2 (two) times daily.   hydrochlorothiazide 12.5 MG capsule Commonly known as: MICROZIDE Take 1 capsule  (12.5 mg total) by mouth daily.   HYDROcodone-acetaminophen 5-325 MG tablet Commonly known as: Norco Take 1 tablet by mouth every 4 (four) hours as needed for moderate pain. What changed: when to take this   losartan 25 MG tablet Commonly known as: COZAAR Take 12.5 mg by mouth 2 (two) times daily.   methocarbamol 500 MG tablet Commonly known as: Robaxin Take 1 tablet (500 mg total) by mouth every 8 (eight) hours as needed for muscle spasms.   MULTI-ENZYME PO Take 1 capsule by mouth daily.   multivitamin with minerals Tabs tablet Take 1 tablet by mouth daily. Women's Multivitamin   nadolol 20 MG tablet Commonly known as: CORGARD Take 10 mg by mouth 2 (two) times daily with breakfast and lunch.   ondansetron 4 MG tablet Commonly known as: Zofran Take 1 tablet (4 mg total) by mouth every 8 (eight) hours as needed for nausea or vomiting.   PREVAGEN PO Take 1 tablet by mouth daily.   Systane 0.4-0.3 % Soln Generic drug: Polyethyl Glycol-Propyl Glycol Place 2 drops into both eyes 3 (three) times daily as needed (dry eyes).   Triple Omega-3-6-9 Caps Take 1 capsule by mouth daily.   TURMERIC PO Take 1 tablet by mouth daily.   vitamin A 25000 UNIT capsule Take 25,000 Units by mouth daily.   vitamin C 1000 MG tablet Take 1,000 mg by mouth daily.   Vitamin D3 250 MCG (10000 UT) Tabs Take 10,000 Units by mouth daily.   vitamin E 1000 UNIT capsule Take 1,000 Units by mouth daily.        Signed: Ophelia Charter 04/12/2019, 7:52 AM

## 2019-04-12 NOTE — Plan of Care (Signed)
Patient alert and oriented, mae's well, voiding adequate amount of urine, swallowing without difficulty, no c/o pain at time of discharge. Patient discharged home with family. Script and discharged instructions given to patient. Patient and family stated understanding of instructions given. Patient has an appointment with Dr. Jenkins   

## 2019-04-12 NOTE — Evaluation (Signed)
Physical Therapy Evaluation and Discharge Patient Details Name: Chelsea Mcintyre MRN: [id number] DOB: 1933-02-15 Today's Date: 04/12/2019   History of Present Illness  85 yo female s/p T7-8 kyphoplasty for T7-8 compression fx after fall.  Clinical Impression  Patient is s/p above surgery resulting in the deficits listed below (see PT Problem List). Pt functioning at supervision level and will have daughter available to assist for a few days. Pt with good understanding of precautions and is safe with and without RW for ambulation. Pt with no further acute PT needs. PT SIGNING OFF. Please re-consult if needed  Follow Up Recommendations No PT follow up;Supervision - Intermittent    Equipment Recommendations  None recommended by PT    Recommendations for Other Services       Precautions / Restrictions Precautions Precautions: Fall;Back Precaution Booklet Issued: Yes (comment) Precaution Comments: pt able to recall 3/3 precautions however requires verbal cues to adhere to "no twisting" during function Restrictions Weight Bearing Restrictions: No      Mobility  Bed Mobility Overal bed mobility: Needs Assistance Bed Mobility: Rolling;Sidelying to Sit;Sit to Sidelying Rolling: Supervision Sidelying to sit: Supervision     Sit to sidelying: Supervision General bed mobility comments: practiced log rolling with HOB flat instead of HOB elevated as she was doing at home. Educated on importance of not twisting  Transfers Overall transfer level: Needs assistance Equipment used: None Transfers: Sit to/from Stand Sit to Stand: Supervision         General transfer comment: Supervision for safety, no trunk flexion  Ambulation/Gait Ambulation/Gait assistance: Min guard;Supervision Gait Distance (Feet): 200 Feet Assistive device: Rolling walker (2 wheeled);None Gait Pattern/deviations: Step-through pattern;Decreased stride length Gait velocity: wfl Gait velocity interpretation:  >2.62 ft/sec, indicative of community ambulatory General Gait Details: pt min guard with ambulation without AD. Pt with more upright posture without RW. Verbal cues when using RW to stay in walker and not increased bilat UE WBing/dependence on walker causing it to be pushed to far forward/inceased trunk flexion. pt also with increased speed with RW  Stairs Stairs: Yes Stairs assistance: Min guard Stair Management: One rail Left;Alternating pattern;Forwards Number of Stairs: 4 General stair comments: good technique  Wheelchair Mobility    Modified Rankin (Stroke Patients Only)       Balance Overall balance assessment: Mild deficits observed, not formally tested                                           Pertinent Vitals/Pain Pain Assessment: 0-10 Pain Score: 3  Faces Pain Scale: Hurts little more Pain Location: Back Pain Descriptors / Indicators: Sore Pain Intervention(s): Monitored during session    Home Living Family/patient expects to be discharged to:: Private residence Living Arrangements: Alone Available Help at Discharge: Family;Available PRN/intermittently Type of Home: House Home Access: Stairs to enter Entrance Stairs-Rails: Can reach both Entrance Stairs-Number of Steps: 4 back door, which she normally uses Home Layout: Multi-level;Able to live on main level with bedroom/bathroom(Sewing room on second floor. doesnlt need to go to basement) Home Equipment: Clinical cytogeneticist - 2 wheels Additional Comments: daughter and daughter in law avail on/off x 3 weeks    Prior Function Level of Independence: Independent         Comments: ADLs and IADLs     Hand Dominance   Dominant Hand: Left    Extremity/Trunk Assessment   Upper Extremity Assessment  Upper Extremity Assessment: Overall WFL for tasks assessed    Lower Extremity Assessment Lower Extremity Assessment: Generalized weakness    Cervical / Trunk Assessment Cervical / Trunk  Assessment: Other exceptions Cervical / Trunk Exceptions: S/p back sx  Communication   Communication: No difficulties  Cognition Arousal/Alertness: Awake/alert Behavior During Therapy: WFL for tasks assessed/performed Overall Cognitive Status: Within Functional Limits for tasks assessed                                        General Comments General comments (skin integrity, edema, etc.): vss    Exercises     Assessment/Plan    PT Assessment Patent does not need any further PT services  PT Problem List Decreased strength;Decreased balance;Decreased mobility;Decreased activity tolerance;Decreased safety awareness       PT Treatment Interventions      PT Goals (Current goals can be found in the Care Plan section)  Acute Rehab PT Goals Patient Stated Goal: "Go home later today" PT Goal Formulation: All assessment and education complete, DC therapy    Frequency     Barriers to discharge        Co-evaluation               AM-PAC PT "6 Clicks" Mobility  Outcome Measure Help needed turning from your back to your side while in a flat bed without using bedrails?: None Help needed moving from lying on your back to sitting on the side of a flat bed without using bedrails?: None Help needed moving to and from a bed to a chair (including a wheelchair)?: None Help needed standing up from a chair using your arms (e.g., wheelchair or bedside chair)?: None Help needed to walk in hospital room?: None Help needed climbing 3-5 steps with a railing? : A Little 6 Click Score: 23    End of Session   Activity Tolerance: Patient tolerated treatment well Patient left: in bed;with call bell/phone within reach Nurse Communication: Mobility status PT Visit Diagnosis: Unsteadiness on feet (R26.81)    [id number] PT Time Calculation (min) (ACUTE ONLY): 14 min   Charges:   PT Evaluation $PT Eval Low Complexity: 1 Low          Kittie Plater, PT, DPT Acute  Rehabilitation Services Pager #: [phone] Office #: [phone]   Berline Lopes 04/12/2019, 9:05 AM

## 2020-10-30 IMAGING — RF DG THORACIC SPINE 2V
1 series · 1 of 1 positions shown · non-contrast
Comparison: Plain films thoracic spine 03/12/2019.

CLINICAL DATA: Intraoperative imaging for T7 and T8 kyphoplasty.

EXAM:
OPERATIVE THORACIC SPINE 2 VIEW(S)

[Series 1: run · 1 of 1 slices shown]
[im 1/1]
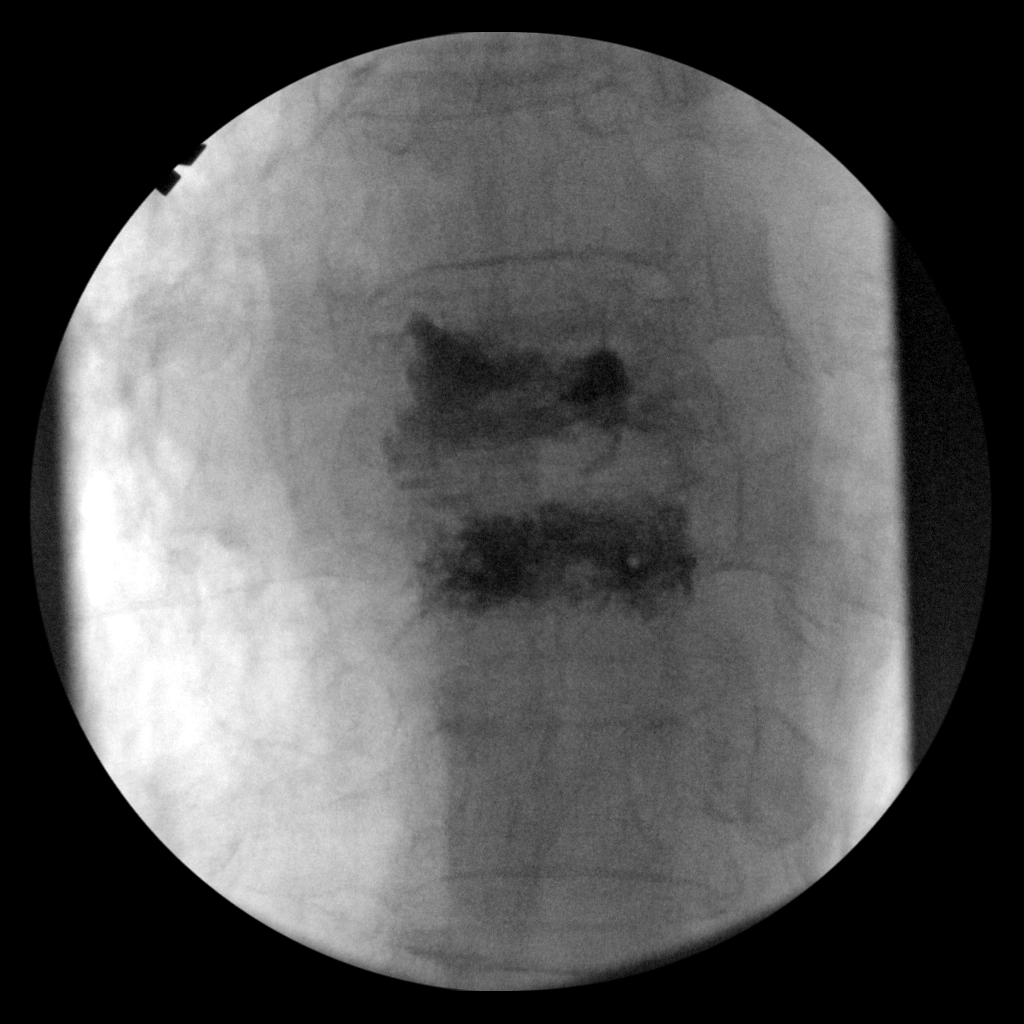

[1 of 1 positions shown; findings below may reference images not displayed]

FINDINGS: Two views of the midthoracic spine demonstrate methylmethacrylate
for treatment of 2 contiguous compression fractures at T7 and T8.
IMPRESSION: Intraoperative imaging for T7 and T8 vertebral augmentation.

## 2020-10-30 IMAGING — RF DG C-ARM 1-60 MIN
2 series · 2 of 2 positions shown · non-contrast
Comparison: Plain films thoracic spine 03/12/2019.

CLINICAL DATA: Intraoperative imaging for T7 and T8 kyphoplasty.

EXAM:
OPERATIVE THORACIC SPINE 2 VIEW(S)

[Series 1: run · 1 of 1 slices shown (1 of 2)]
[im 1/1]
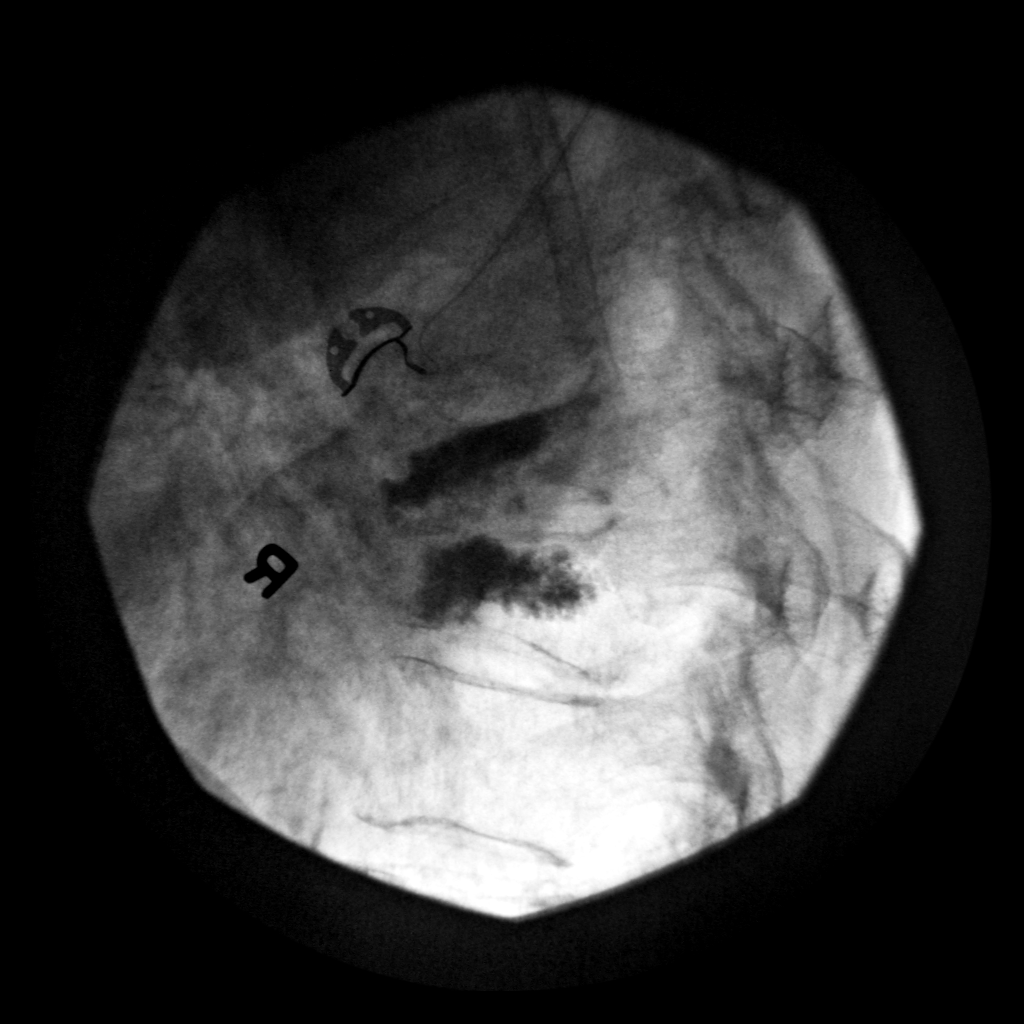

[Series 1: run · 1 of 1 slices shown (2 of 2)]
[im 1/1]
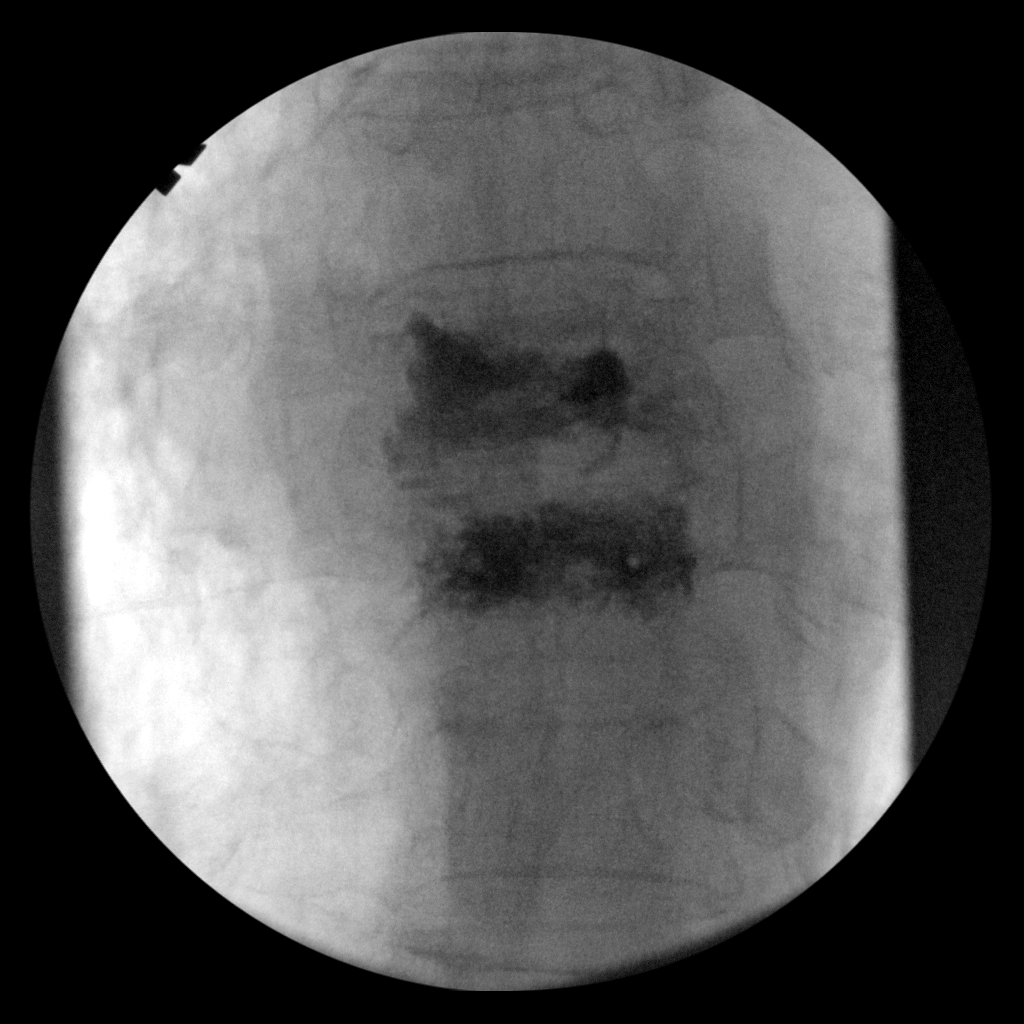

[2 of 2 positions shown; findings below may reference images not displayed]

FINDINGS: Two views of the midthoracic spine demonstrate methylmethacrylate
for treatment of 2 contiguous compression fractures at T7 and T8.
IMPRESSION: Intraoperative imaging for T7 and T8 vertebral augmentation.

## 2020-12-10 DIAGNOSIS — Z8616 Personal history of COVID-19: Secondary | ICD-10-CM

## 2020-12-10 HISTORY — DX: Personal history of COVID-19: Z86.16

## 2021-08-14 DIAGNOSIS — R2232 Localized swelling, mass and lump, left upper limb: Secondary | ICD-10-CM | POA: Insufficient documentation

## 2021-08-14 HISTORY — DX: Localized swelling, mass and lump, left upper limb: R22.32

## 2021-08-28 ENCOUNTER — Other Ambulatory Visit: Payer: Self-pay | Admitting: Radiology

## 2021-08-30 ENCOUNTER — Encounter: Payer: Self-pay | Admitting: *Deleted

## 2021-08-30 ENCOUNTER — Telehealth: Payer: Self-pay | Admitting: Hematology and Oncology

## 2021-08-30 ENCOUNTER — Encounter: Payer: Self-pay | Admitting: Internal Medicine

## 2021-08-30 DIAGNOSIS — C50411 Malignant neoplasm of upper-outer quadrant of right female breast: Secondary | ICD-10-CM | POA: Insufficient documentation

## 2021-08-30 DIAGNOSIS — D0511 Intraductal carcinoma in situ of right breast: Secondary | ICD-10-CM | POA: Insufficient documentation

## 2021-08-30 DIAGNOSIS — Z170 Estrogen receptor positive status [ER+]: Secondary | ICD-10-CM

## 2021-08-30 HISTORY — DX: Estrogen receptor positive status (ER+): Z17.0

## 2021-08-30 HISTORY — DX: Intraductal carcinoma in situ of right breast: D05.11

## 2021-08-30 NOTE — Telephone Encounter (Signed)
LVM for daughter Stanton Kidney to return my call in reference to upcoming clinic appointment and letting her know I could get her in sooner than later ?

## 2021-09-02 NOTE — Progress Notes (Signed)
[facility] ?CONSULT NOTE ? ?Patient Care Team: ?Raina Mina., MD as PCP - General (Internal Medicine) ?Rockwell Germany, RN as Oncology Nurse Navigator ?Mauro Kaufmann, RN as Oncology Nurse Navigator ?Stark Klein, MD as Consulting Physician (General Surgery) ?Nicholas Lose, MD as Consulting Physician (Hematology and Oncology) ?Gery Pray, MD as Consulting Physician (Radiation Oncology) ? ?CHIEF COMPLAINTS/PURPOSE OF CONSULTATION:  ?Newly diagnosed breast cancer Dcis right breast cancer ? ?HISTORY OF PRESENTING ILLNESS:  ?Chelsea Mcintyre 88 y.o. female is here because of recent diagnosis of right breast Dcis  ?Patient had a palpable abnormality which turned out to be a lipoma.  But incidentally the mammograms detected calcifications in the right breast upper outer quadrant measuring 1.4 cm.  Stereotactic biopsy revealed low to intermediate grade DCIS with calcifications and necrosis that was ER/PR positive.  She was presented this morning to the multidisciplinary tumor board and she is here today to discuss her treatment plan.  She is accompanied by her daughter who is a Lexicographer. ? ?I reviewed her records extensively and collaborated the history with the patient. ? ?SUMMARY OF ONCOLOGIC HISTORY: ?Oncology History  ?Ductal carcinoma in situ (DCIS) of right breast  ?08/28/2021 Initial Diagnosis  ? Screening mammogram detected calcifications in the right breast UOQ spanning 1.4 cm, stereotactic biopsy revealed low-grade to intermediate grade DCIS with calcifications and necrosis, ER positive ?  ?09/04/2021 Cancer Staging  ? Staging form: Breast, AJCC 8th Edition ?- Clinical stage from 09/04/2021: Stage 0 (cTis (DCIS), cN0, cM0, G2, ER+, PR+, HER2: Not Assessed) - Signed by Nicholas Lose, MD on 09/04/2021 ?Stage prefix: Initial diagnosis ?Histologic grading system: 3 grade system ? ?  ? ? ? ?MEDICAL HISTORY:  ?Past Medical History:  ?Diagnosis Date  ? Arthritis   ? bilateral hips and bilateral  shoulders.  ? Cancer ([place])   ? skin back, nose  ? Complication of anesthesia   ? heart stopped with the anesthesia x1,  difficult to wake up  ? Dysrhythmia   ? if taking certain medications  ? Family history of adverse reaction to anesthesia   ? sister slow to awaken- "almost died last time"  ? Frequency of urination   ? Hypertension   ? Jaundice   ? as a child  ? MVP (mitral valve prolapse)   ? Pneumonia 09  ? hx  ? 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 family history of pt thinks she might have as well  ? Recurrent upper respiratory infection (URI)   ? Dx 1st of september with sinus infection, placed on antibiotics at that time  ? ? ?SURGICAL HISTORY: ?Past Surgical History:  ?Procedure Laterality Date  ? ABDOMINAL HYSTERECTOMY  73  ? partial  ? APPENDECTOMY    ? BACK SURGERY  2012  ? kyphoplasty  ? BREAST SURGERY    ? 1982,70rt  ? CARDIAC CATHETERIZATION    ? 1994, detected MVP  ? ELBOW FRACTURE SURGERY Left 98,  ? hardware removed later in year  ? EYE SURGERY    ? bilateral cataract surgery 2007 with lens implant  ? fibroid cyst    ? ovary right removed 1966  ? fibroid tumor    ? 1970, right breast removed  ? FRACTURE SURGERY    ? 1988 shattered elbow left  ? HARDWARE REMOVAL    ? left elbow 1998  ? KYPHOPLASTY Bilateral 04/11/2019  ? Procedure: KYPHOPLASTY BILATERAL, THORACIC SEVEN- THORACIC EIGHT;  Surgeon: Newman Pies, MD;  Location: [facility];  Service: Neurosurgery;  Laterality: Bilateral;  KYPHOPLASTY BILATERAL, THORACIC 7- THORACIC 8  ? REVERSE SHOULDER ARTHROPLASTY Right 10/28/2018  ? Procedure: REVERSE SHOULDER ARTHROPLASTY;  Surgeon: Justice Britain, MD;  Location: [facility];  Service: Orthopedics;  Laterality: Right;  145mn  ? TONSILLECTOMY  44  ? and adenoids  ? TOTAL HIP ARTHROPLASTY Left 05/15/2015  ? Procedure: LEFT TOTAL HIP ARTHROPLASTY ANTERIOR APPROACH;  Surgeon: BRod Can MD;  Location[place] [facility]  Service: Orthopedics;  Laterality: Left;  ? ? ?SOCIAL HISTORY: ?Social History  ? ?Socioeconomic History   ? Marital status: Widowed  ?  Spouse name: Not on file  ? Number of children: Not on file  ? Years of education: Not on file  ? Highest education level: Not on file  ?Occupational History  ? Not on file  ?Tobacco Use  ? Smoking status: Former  ?  Packs/day: 0.50  ?  Years: 2.00  ?  Pack years: 1.00  ?  Types: Cigarettes  ? Smokeless tobacco: Never  ? Tobacco comments:  ?  off and on, non smoker presently  ?Vaping Use  ? Vaping Use: Never used  ?Substance and Sexual Activity  ? Alcohol use: No  ? Drug use: No  ? Sexual activity: Not on file  ?Other Topics Concern  ? Not on file  ?Social History Narrative  ? Not on file  ? ?Social Determinants of Health  ? ?Financial Resource Strain: Not on file  ?Food Insecurity: Not on file  ?Transportation Needs: Not on file  ?Physical Activity: Not on file  ?Stress: Not on file  ?Social Connections: Not on file  ?Intimate Partner Violence: Not on file  ? ? ?FAMILY HISTORY: ?Family History  ?Problem Relation Age of Onset  ? Anesthesia problems Father   ? ? ?ALLERGIES:  is allergic to hrt base [hormone cream base], sulfa drugs cross reactors, gluten meal, tape, ace inhibitors, latex, lisinopril, nsaids, sulphasomidine, amlodipine, and barbiturates. ? ?MEDICATIONS:  ?Current Outpatient Medications  ?Medication Sig Dispense Refill  ? amoxicillin (AMOXIL) 500 MG capsule Take 2,000 mg by mouth See admin instructions. Take 4 capsules (2000 mg) by mouth 1 hour prior to dental work    ? clopidogrel (PLAVIX) 75 MG tablet Take 37.5 mg by mouth daily.    ? docusate sodium (COLACE) 100 MG capsule Take 1 capsule (100 mg total) by mouth 2 (two) times daily. 60 capsule 0  ? ezetimibe (ZETIA) 10 MG tablet Take 10 mg by mouth daily.    ? hydrochlorothiazide (MICROZIDE) 12.5 MG capsule Take 1 capsule (12.5 mg total) by mouth daily. 30 capsule 0  ? losartan (COZAAR) 25 MG tablet Take 12.5 mg by mouth 2 (two) times daily.   0  ? nadolol (CORGARD) 20 MG tablet Take 10 mg by mouth 2 (two) times daily  with breakfast and lunch.     ? Multiple Vitamin (MULTIVITAMIN WITH MINERALS) TABS tablet Take 1 tablet by mouth daily. Women's Multivitamin    ? Omega 3-6-9 Fatty Acids (TRIPLE OMEGA-3-6-9) CAPS Take 1 capsule by mouth daily.    ? ondansetron (ZOFRAN) 4 MG tablet Take 1 tablet (4 mg total) by mouth every 8 (eight) hours as needed for nausea or vomiting. (Patient not taking: Reported on 04/04/2019) 10 tablet 0  ? ?No current facility-administered medications for this visit.  ? ? ?REVIEW OF SYSTEMS:   ?Constitutional: Denies fevers, chills or abnormal night sweats ?All other systems were reviewed with the patient and are negative. ? ?PHYSICAL EXAMINATION: ?ECOG PERFORMANCE  STATUS: 1 - Symptomatic but completely ambulatory ? ?Vitals:  ? 09/04/21 0838  ?BP: 131/71  ?Pulse: 69  ?Resp: 17  ?Temp: 97.6 ?F (36.4 ?C)  ?SpO2: 99%  ? ?Filed Weights  ? 09/04/21 0838  ?Weight: 156 lb 4.8 oz (70.9 kg)  ? ?LABORATORY DATA:  ?I have reviewed the data as listed ?Lab Results  ?Component Value Date  ? WBC 5.0 09/04/2021  ? HGB 14.0 09/04/2021  ? HCT 42.6 09/04/2021  ? MCV 92.4 09/04/2021  ? PLT 198 09/04/2021  ? ?Lab Results  ?Component Value Date  ? NA 143 09/04/2021  ? K 4.1 09/04/2021  ? CL 105 09/04/2021  ? CO2 32 09/04/2021  ? ? ?RADIOGRAPHIC STUDIES: ?I have personally reviewed the radiological reports and agreed with the findings in the report. ? ?ASSESSMENT AND PLAN:  ?Ductal carcinoma in situ (DCIS) of right breast ?08/28/2021:Screening mammogram detected calcifications in the right breast UOQ spanning 1.4 cm, stereotactic biopsy revealed low-grade to intermediate grade DCIS with calcifications and necrosis, ER positive ? ?Pathology review: I discussed with the patient the difference between DCIS and invasive breast cancer. It is considered a precancerous lesion. DCIS is classified as a 0. It is generally detected through mammograms as calcifications. We discussed the significance of grades and its impact on prognosis. We  also discussed the importance of ER and PR receptors and their implications to adjuvant treatment options. Prognosis of DCIS dependence on grade, comedo necrosis. It is anticipated that if not treated, 20-3

## 2021-09-03 NOTE — Progress Notes (Signed)
?Radiation Oncology         [phone] ?________________________________ ? ?Multidisciplinary Breast Oncology Clinic [facility]) ?Initial Outpatient Consultation ? ?Name: Chelsea Mcintyre MRN: [id number]  ?Date: 09/04/2021  DOB: June 17, 1932 ? ?UX:LKGMWN, Chelsea Mcintyre., MD  Stark Klein, MD  ? ?REFERRING PHYSICIAN: Stark Klein, MD ? ?DIAGNOSIS: The encounter diagnosis was Ductal carcinoma in situ (DCIS) of right breast. ? ?Stage 0 (cTis (DCIS), cN0, cM0) Right Breast UOQ, Low-intermediate grade ductal carcinoma in-situ, ER+ / PR+ / Her2 not assessed ? ?  ICD-10-CM   ?1. Ductal carcinoma in situ (DCIS) of right breast  D05.11   ?  ? ? ?HISTORY OF PRESENT ILLNESS::Chelsea Mcintyre is a 88 y.o. female who is presenting to the office today for evaluation of her newly diagnosed breast cancer. She is accompanied by her daughter who is a Engineer, drilling. She is doing well overall.  ? ?The patient presented with a palpable left breast/lower axillary lump x 2 weeks. Subsequent bilateral diagnostic mammography with tomography at [facility] on 08/16/21 revealed grouped fine linear calcifications in the UOQ of the right breast, middle depth, suspicious of malignancy, and measuring 1.4 cm collectively. The palpable mass felt on examination was found to likely represent a benign lipoma vs accessory fat pad. ? ?Biopsy if the upper outer right breast (6 cmfn) on 08/28/21 showed: low-intermediate grade ductal carcinoma in-situ (apocrine type) with calcifications and central necrosis. Prognostic indicators significant for: estrogen receptor, 100% positive and progesterone receptor, 95% positive, both with moderate to strong staining intensity. HER2 not assessed. ? ?Menarche: 88 years old ?Age at first live birth: 88 years old ?GP: 2 ?LMP: 1973 ?Contraceptive: yes, she reports taking the pill when she was 74-51 years old (on the provided for she indicated that after she stopped taking the pill her menstrual flow was interrupted).  ?HRT: never used  ? ?The  patient was referred today for presentation in the multidisciplinary conference.  Radiology studies and pathology slides were presented there for review and discussion of treatment options.  A consensus was discussed regarding potential next steps. ? ?PREVIOUS RADIATION THERAPY: No ? ?PAST MEDICAL HISTORY:  ?Past Medical History:  ?Diagnosis Date  ? Arthritis   ? bilateral hips and bilateral shoulders.  ? Cancer ([place])   ? skin back, nose  ? Complication of anesthesia   ? heart stopped with the anesthesia x1,  difficult to wake up  ? Dysrhythmia   ? if taking certain medications  ? Family history of adverse reaction to anesthesia   ? sister slow to awaken- "almost died last time"  ? Frequency of urination   ? Hypertension   ? Jaundice   ? as a child  ? MVP (mitral valve prolapse)   ? Pneumonia 09  ? hx  ? 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 family history of pt thinks she might have as well  ? Recurrent upper respiratory infection (URI)   ? Dx 1st of september with sinus infection, placed on antibiotics at that time  ? ? ?PAST SURGICAL HISTORY: ?Past Surgical History:  ?Procedure Laterality Date  ? ABDOMINAL HYSTERECTOMY  73  ? partial  ? APPENDECTOMY    ? BACK SURGERY  2012  ? kyphoplasty  ? BREAST SURGERY    ? 1982,70rt  ? CARDIAC CATHETERIZATION    ? 1994, detected MVP  ? ELBOW FRACTURE SURGERY Left 98,  ? hardware removed later in year  ? EYE SURGERY    ? bilateral cataract surgery 2007 with lens implant  ?  fibroid cyst    ? ovary right removed 1966  ? fibroid tumor    ? 1970, right breast removed  ? FRACTURE SURGERY    ? 1988 shattered elbow left  ? HARDWARE REMOVAL    ? left elbow 1998  ? KYPHOPLASTY Bilateral 04/11/2019  ? Procedure: KYPHOPLASTY BILATERAL, THORACIC SEVEN- THORACIC EIGHT;  Surgeon: Newman Pies, MD;  Location: [facility];  Service: Neurosurgery;  Laterality: Bilateral;  KYPHOPLASTY BILATERAL, THORACIC 7- THORACIC 8  ? REVERSE SHOULDER ARTHROPLASTY Right 10/28/2018  ? Procedure: REVERSE SHOULDER  ARTHROPLASTY;  Surgeon: Justice Britain, MD;  Location: [facility];  Service: Orthopedics;  Laterality: Right;  165mn  ? TONSILLECTOMY  42  ? and adenoids  ? TOTAL HIP ARTHROPLASTY Left 05/15/2015  ? Procedure: LEFT TOTAL HIP ARTHROPLASTY ANTERIOR APPROACH;  Surgeon: BRod Can MD;  [place] [facility]  Service: Orthopedics;  Laterality: Left;  ? ? ?FAMILY HISTORY:  ?Family History  ?Problem Relation Age of Onset  ? Acute myelogenous leukemia Mother 734 ? Anesthesia problems Father   ? Lung cancer Father 754 ?     smoked  ? Pancreatic cancer Maternal Aunt   ?     dx. 632s ? Prostate cancer Maternal Uncle   ?     dx. 60s, metastatic  ? Breast cancer Paternal Aunt   ?     dx. 523s ? Breast cancer Paternal Grandmother   ? ? ?SOCIAL HISTORY:  ?Social History  ? ?Socioeconomic History  ? Marital status: Widowed  ?  Spouse name: Not on file  ? Number of children: Not on file  ? Years of education: Not on file  ? Highest education level: Not on file  ?Occupational History  ? Not on file  ?Tobacco Use  ? Smoking status: Former  ?  Packs/day: 0.50  ?  Years: 2.00  ?  Pack years: 1.00  ?  Types: Cigarettes  ? Smokeless tobacco: Never  ? Tobacco comments:  ?  off and on, non smoker presently  ?Vaping Use  ? Vaping Use: Never used  ?Substance and Sexual Activity  ? Alcohol use: No  ? Drug use: No  ? Sexual activity: Not on file  ?Other Topics Concern  ? Not on file  ?Social History Narrative  ? Not on file  ? ?Social Determinants of Health  ? ?Financial Resource Strain: Not on file  ?Food Insecurity: Not on file  ?Transportation Needs: Not on file  ?Physical Activity: Not on file  ?Stress: Not on file  ?Social Connections: Not on file  ? ? ?ALLERGIES:  ?Allergies  ?Allergen Reactions  ? Hrt Base [Hormone Cream Base] Shortness Of Breath  ?  All hormone replacement medications   ? Sulfa Drugs Cross Reactors Shortness Of Breath and Rash  ? Gluten Meal Nausea Only  ?  Bothers her stomach ?  ? Tape Other (See Comments)  ?  Layer of  skin peels off and area becomes raw  ? Ace Inhibitors Cough  ? Latex Hives  ? Lisinopril Cough  ? Nsaids   ? Statins   ?  Other reaction(s): Myalgias (intolerance)  ? Sulphasomidine   ? Amlodipine Rash  ? Barbiturates Rash  ? ? ?MEDICATIONS:  ?Current Outpatient Medications  ?Medication Sig Dispense Refill  ? amoxicillin (AMOXIL) 500 MG capsule Take 2,000 mg by mouth See admin instructions. Take 4 capsules (2000 mg) by mouth 1 hour prior to dental work    ? clopidogrel (PLAVIX) 75 MG  tablet Take 37.5 mg by mouth daily.    ? docusate sodium (COLACE) 100 MG capsule Take 1 capsule (100 mg total) by mouth 2 (two) times daily. 60 capsule 0  ? ezetimibe (ZETIA) 10 MG tablet Take 10 mg by mouth daily.    ? hydrochlorothiazide (MICROZIDE) 12.5 MG capsule Take 1 capsule (12.5 mg total) by mouth daily. 30 capsule 0  ? losartan (COZAAR) 25 MG tablet Take 12.5 mg by mouth 2 (two) times daily.   0  ? Multiple Vitamin (MULTIVITAMIN WITH MINERALS) TABS tablet Take 1 tablet by mouth daily. Women's Multivitamin    ? nadolol (CORGARD) 20 MG tablet Take 10 mg by mouth 2 (two) times daily with breakfast and lunch.     ? Omega 3-6-9 Fatty Acids (TRIPLE OMEGA-3-6-9) CAPS Take 1 capsule by mouth daily.    ? ondansetron (ZOFRAN) 4 MG tablet Take 1 tablet (4 mg total) by mouth every 8 (eight) hours as needed for nausea or vomiting. (Patient not taking: Reported on 04/04/2019) 10 tablet 0  ? ?No current facility-administered medications for this encounter.  ? ? ?REVIEW OF SYSTEMS: A 10+ POINT REVIEW OF SYSTEMS WAS OBTAINED including neurology, dermatology, psychiatry, cardiac, respiratory, lymph, extremities, GI, GU, musculoskeletal, constitutional, reproductive, HEENT. On the provided form, she reports occasional night sweats, loss of sleep, fatigue that effects her daily activities, upper back pain (throbbing/stabbing pain), muscle cramping, wearing glasses when she works, hearing loss, wearing hearing aids, ringing in ears, sinus  problems, a drippy nose since she had COVID in 2022, some difficulty swallowing since she suffered a stroke in 2018, feet swelling, poor circulation in her legs, SOB at rest and when walking (mainly up the stairs), in

## 2021-09-04 ENCOUNTER — Inpatient Hospital Stay: Payer: Medicare PPO | Attending: Hematology and Oncology

## 2021-09-04 ENCOUNTER — Inpatient Hospital Stay: Payer: Medicare PPO | Admitting: Emergency Medicine

## 2021-09-04 ENCOUNTER — Inpatient Hospital Stay: Payer: Medicare PPO | Admitting: Licensed Clinical Social Worker

## 2021-09-04 ENCOUNTER — Ambulatory Visit: Payer: Medicare PPO | Admitting: Physical Therapy

## 2021-09-04 ENCOUNTER — Other Ambulatory Visit: Payer: Self-pay

## 2021-09-04 ENCOUNTER — Inpatient Hospital Stay: Payer: Medicare PPO | Admitting: Hematology and Oncology

## 2021-09-04 ENCOUNTER — Encounter: Payer: Self-pay | Admitting: *Deleted

## 2021-09-04 ENCOUNTER — Ambulatory Visit
Admission: RE | Admit: 2021-09-04 | Discharge: 2021-09-04 | Disposition: A | Discharge status: Home / Self Care | Payer: Medicare PPO | Source: Ambulatory Visit | Attending: Radiation Oncology | Admitting: Radiation Oncology

## 2021-09-04 ENCOUNTER — Inpatient Hospital Stay: Payer: Medicare PPO | Admitting: Genetic Counselor

## 2021-09-04 ENCOUNTER — Encounter: Payer: Self-pay | Admitting: Genetic Counselor

## 2021-09-04 ENCOUNTER — Other Ambulatory Visit: Payer: Self-pay | Admitting: General Surgery

## 2021-09-04 DIAGNOSIS — R197 Diarrhea, unspecified: Secondary | ICD-10-CM | POA: Insufficient documentation

## 2021-09-04 DIAGNOSIS — D0511 Intraductal carcinoma in situ of right breast: Secondary | ICD-10-CM

## 2021-09-04 DIAGNOSIS — Z800 Family history of malignant neoplasm of digestive organs: Secondary | ICD-10-CM

## 2021-09-04 DIAGNOSIS — I10 Essential (primary) hypertension: Secondary | ICD-10-CM | POA: Insufficient documentation

## 2021-09-04 DIAGNOSIS — Z803 Family history of malignant neoplasm of breast: Secondary | ICD-10-CM

## 2021-09-04 DIAGNOSIS — Z170 Estrogen receptor positive status [ER+]: Secondary | ICD-10-CM | POA: Diagnosis not present

## 2021-09-04 DIAGNOSIS — H9190 Unspecified hearing loss, unspecified ear: Secondary | ICD-10-CM | POA: Insufficient documentation

## 2021-09-04 DIAGNOSIS — Z9189 Other specified personal risk factors, not elsewhere classified: Secondary | ICD-10-CM

## 2021-09-04 DIAGNOSIS — Z79899 Other long term (current) drug therapy: Secondary | ICD-10-CM | POA: Diagnosis not present

## 2021-09-04 DIAGNOSIS — Z87891 Personal history of nicotine dependence: Secondary | ICD-10-CM | POA: Diagnosis not present

## 2021-09-04 DIAGNOSIS — M7989 Other specified soft tissue disorders: Secondary | ICD-10-CM | POA: Insufficient documentation

## 2021-09-04 HISTORY — DX: Diarrhea, unspecified: R19.7

## 2021-09-04 HISTORY — DX: Family history of malignant neoplasm of digestive organs: Z80.0

## 2021-09-04 HISTORY — DX: Other specified soft tissue disorders: M79.89

## 2021-09-04 HISTORY — DX: Unspecified hearing loss, unspecified ear: H91.90

## 2021-09-04 HISTORY — DX: Family history of malignant neoplasm of breast: Z80.3

## 2021-09-04 HISTORY — DX: Other specified personal risk factors, not elsewhere classified: Z91.89

## 2021-09-04 LAB — CMP (CANCER CENTER ONLY)
ALT: 14 U/L (ref 0–44)
AST: 21 U/L (ref 15–41)
Albumin: 4.2 g/dL (ref 3.5–5.0)
Alkaline Phosphatase: 81 U/L (ref 38–126)
Anion gap: 6 (ref 5–15)
BUN: 22 mg/dL (ref 8–23)
CO2: 32 mmol/L (ref 22–32)
Calcium: 9.7 mg/dL (ref 8.9–10.3)
Chloride: 105 mmol/L (ref 98–111)
Creatinine: 0.83 mg/dL (ref 0.44–1.00)
GFR, Estimated: >60 mL/min (ref >60)
Glucose, Bld: 92 mg/dL (ref 70–99)
Potassium: 4.1 mmol/L (ref 3.5–5.1)
Sodium: 143 mmol/L (ref 135–145)
Total Bilirubin: 0.6 mg/dL (ref 0.3–1.2)
Total Protein: 7 g/dL (ref 6.5–8.1)

## 2021-09-04 LAB — CBC WITH DIFFERENTIAL (CANCER CENTER ONLY)
Abs Immature Granulocytes: 0.03 10*3/uL (ref 0.00–0.07)
Basophils Absolute: 0 10*3/uL (ref 0.0–0.1)
Basophils Relative: 1 %
Eosinophils Absolute: 0.1 10*3/uL (ref 0.0–0.5)
Eosinophils Relative: 2 %
HCT: 42.6 % (ref 36.0–46.0)
Hemoglobin: 14 g/dL (ref 12.0–15.0)
Immature Granulocytes: 1 %
Lymphocytes Relative: 21 %
Lymphs Abs: 1.1 10*3/uL (ref 0.7–4.0)
MCH: 30.4 pg (ref 26.0–34.0)
MCHC: 32.9 g/dL (ref 30.0–36.0)
MCV: 92.4 fL (ref 80.0–100.0)
Monocytes Absolute: 0.6 10*3/uL (ref 0.1–1.0)
Monocytes Relative: 11 %
Neutro Abs: 3.3 10*3/uL (ref 1.7–7.7)
Neutrophils Relative %: 64 %
Platelet Count: 198 10*3/uL (ref 150–400)
RBC: 4.61 MIL/uL (ref 3.87–5.11)
RDW: 12.9 % (ref 11.5–15.5)
WBC Count: 5 10*3/uL (ref 4.0–10.5)
nRBC: 0 % (ref 0.0–0.2)

## 2021-09-04 LAB — GENETIC SCREENING ORDER

## 2021-09-04 NOTE — Research (Signed)
MTG-015 - Tissue and Bodily Fluids: Translational Medicine: Discovery and Evaluation of Biomarkers/Pharmacogenomics for the Diagnosis and Personalized Management of Patients  ? ?INTRO STUDY/CONSENTS ? ?MT3505-A ? ?Patient Chelsea Mcintyre was identified by MD Lindi Adie as a potential candidate for the above listed study.  This Clinical Research Nurse met with Chelsea Mcintyre, [id number], on 09/04/21 in a manner and location that ensures patient privacy to discuss participation in the above listed research study.  Patient is Accompanied by her daughter Chelsea Mcintyre .  A copy of the informed consent document and separate HIPAA Authorization was provided to the patient.  Patient reads, speaks, and understands [place].   ?Patient was provided with the business card of this Nurse and encouraged to contact the research team with any questions.  Approximately 25 minutes were spent with the patient reviewing the informed consent documents.  Patient was provided the option of taking informed consent documents home to review and was encouraged to review at their convenience with their support network, including other care providers. Patient took the consent documents home to review.  Will f/u with patient in the next few days to determine interest. ? ?Wells Guiles 'LizaNeysa Bonito, RN, BSN ?Clinical Research Nurse I ?09/04/21 ?11:16 AM ? ? ?

## 2021-09-04 NOTE — Progress Notes (Signed)
REFERRING PROVIDER: ?Chelsea Lose, MD ?[address] ?[place], [place] [address] ? ?PRIMARY PROVIDER:  ?Chelsea Mcintyre., MD ? ?PRIMARY REASON FOR VISIT:  ?1. Ductal carcinoma in situ (DCIS) of right breast   ?2. Family history of pancreatic cancer   ?3. Family history of breast cancer   ? ?HISTORY OF PRESENT ILLNESS:   ?Chelsea Mcintyre, a 88 y.o. female, was seen for a [facility] cancer genetics consultation at the request of Dr. Lindi Mcintyre due to a personal and family history of cancer.  Chelsea Mcintyre presents to clinic today to discuss the possibility of a hereditary predisposition to cancer, to discuss genetic testing, and to further clarify her future cancer risks, as well as potential cancer risks for family members.  ? ?In April 2023, at the age of 9, Chelsea Mcintyre was diagnosed with ductal carcinoma in situ of the right breast. ? ?CANCER HISTORY:  ?Oncology History  ?Ductal carcinoma in situ (DCIS) of right breast  ?08/28/2021 Initial Diagnosis  ? Screening mammogram detected calcifications in the right breast UOQ spanning 1.4 cm, stereotactic biopsy revealed low-grade to intermediate grade DCIS with calcifications and necrosis, ER positive ?  ?09/04/2021 Cancer Staging  ? Staging form: Breast, AJCC 8th Edition ?- Clinical stage from 09/04/2021: Stage 0 (cTis (DCIS), cN0, cM0, G2, ER+, PR+, HER2: Not Assessed) - Signed by Chelsea Lose, MD on 09/04/2021 ?Stage prefix: Initial diagnosis ?Histologic grading system: 3 grade system ? ?  ? ? ? ?RISK FACTORS:  ?Menarche was at age 63.  ?First live birth at age 80.  ?OCP use for approximately <1 year.  ?Ovaries intact: one.  ?Uterus intact: no.  ?Menopausal status: postmenopausal.  ?HRT use: 0 years. ?Colonoscopy: yes;  2009 . ?Mammogram within the last year: yes. ? ?Past Medical History:  ?Diagnosis Date  ? Arthritis   ? bilateral hips and bilateral shoulders.  ? Cancer ([place])   ? skin back, nose  ? Complication of anesthesia   ? heart stopped with the anesthesia x1,   difficult to wake up  ? Dysrhythmia   ? if taking certain medications  ? Family history of adverse reaction to anesthesia   ? sister slow to awaken- "almost died last time"  ? Frequency of urination   ? Hypertension   ? Jaundice   ? as a child  ? MVP (mitral valve prolapse)   ? Pneumonia 09  ? hx  ? 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 family history of pt thinks she might have as well  ? Recurrent upper respiratory infection (URI)   ? Dx 1st of september with sinus infection, placed on antibiotics at that time  ? ? ?Past Surgical History:  ?Procedure Laterality Date  ? ABDOMINAL HYSTERECTOMY  75  ? partial  ? APPENDECTOMY    ? BACK SURGERY  2012  ? kyphoplasty  ? BREAST SURGERY    ? 1982,70rt  ? CARDIAC CATHETERIZATION    ? 1994, detected MVP  ? ELBOW FRACTURE SURGERY Left 98,  ? hardware removed later in year  ? EYE SURGERY    ? bilateral cataract surgery 2007 with lens implant  ? fibroid cyst    ? ovary right removed 1966  ? fibroid tumor    ? 1970, right breast removed  ? FRACTURE SURGERY    ? 1988 shattered elbow left  ? HARDWARE REMOVAL    ? left elbow 1998  ? KYPHOPLASTY Bilateral 04/11/2019  ? Procedure: KYPHOPLASTY BILATERAL, THORACIC SEVEN- THORACIC EIGHT;  Surgeon: Chelsea Mcintyre,  Chelsea Filbert, MD;  Location: [facility];  Service: Neurosurgery;  Laterality: Bilateral;  KYPHOPLASTY BILATERAL, THORACIC 7- THORACIC 8  ? REVERSE SHOULDER ARTHROPLASTY Right 10/28/2018  ? Procedure: REVERSE SHOULDER ARTHROPLASTY;  Surgeon: Chelsea Britain, MD;  Location: [facility];  Service: Orthopedics;  Laterality: Right;  147mn  ? TONSILLECTOMY  44  ? and adenoids  ? TOTAL HIP ARTHROPLASTY Left 05/15/2015  ? Procedure: LEFT TOTAL HIP ARTHROPLASTY ANTERIOR APPROACH;  Surgeon: BRod Can MD;  Location: [facility]  Service: Orthopedics;  Laterality: Left;  ? ? ?Social History  ? ?Socioeconomic History  ? Marital status: Widowed  ?  Spouse name: Not on file  ? Number of children: Not on file  ? Years of education: Not on file  ? Highest education level:  Not on file  ?Occupational History  ? Not on file  ?Tobacco Use  ? Smoking status: Former  ?  Packs/day: 0.50  ?  Years: 2.00  ?  Pack years: 1.00  ?  Types: Cigarettes  ? Smokeless tobacco: Never  ? Tobacco comments:  ?  off and on, non smoker presently  ?Vaping Use  ? Vaping Use: Never used  ?Substance and Sexual Activity  ? Alcohol use: No  ? Drug use: No  ? Sexual activity: Not on file  ?Other Topics Concern  ? Not on file  ?Social History Narrative  ? Not on file  ? ?Social Determinants of Health  ? ?Financial Resource Strain: Not on file  ?Food Insecurity: Not on file  ?Transportation Needs: Not on file  ?Physical Activity: Not on file  ?Stress: Not on file  ?Social Connections: Not on file  ?  ? ?FAMILY HISTORY:  ?We obtained a detailed, 4-generation family history.  Significant diagnoses are listed below: ?Family History  ?Problem Relation Age of Onset  ? Acute myelogenous leukemia Mother 733 ? Anesthesia problems Father   ? Lung cancer Father 77 ?     smoked  ? Pancreatic cancer Maternal Aunt   ?     dx. 625s ? Prostate cancer Maternal Uncle   ?     dx. 60s, metastatic  ? Breast cancer Paternal Aunt   ?     dx. 59s ? Breast cancer Paternal Grandmother   ? ? ? ?Chelsea Mcintyre's mother was diagnosed with myelogenous leukemia at age 86 she died at age 86 Her maternal aunt was diagnosed with pancreatic cancer in her 637s she is deceased. Her maternal uncle was diagnosed with metastatic prostate cancer in his 61s he is deceased. Her father was diagnosed with lung cancer at age 86 he smoked, he died at age 86 Her paternal aunt was diagnosed with breast cancer in her 549s she is deceased. Her paternal grandmother was diagnosed with breast cancer at an unknown age, she is deceased. Ms. JNewkirkis unaware of previous family history of genetic testing for hereditary cancer risks. There is no reported Ashkenazi Jewish ancestry. ? ?GENETIC COUNSELING ASSESSMENT: Ms. JPolhamusis a 88y.o. female with a personal and  family history of cancer which is somewhat suggestive of a hereditary predisposition to cancer. We, therefore, discussed and recommended the following at today's visit.  ? ?DISCUSSION: We discussed that 5 - 10% of cancer is hereditary, with most cases of breast cancer associated with BRCA1/2.  There are other genes that can be associated with hereditary breast cancer syndromes.  We discussed that testing is beneficial for several reasons including knowing how to follow individuals after  completing their treatment, identifying whether potential treatment options would be beneficial, and understanding if other family members could be at risk for cancer and allowing them to undergo genetic testing.  ? ?We reviewed the characteristics, features and inheritance patterns of hereditary cancer syndromes. We also discussed genetic testing, including the appropriate family members to test, the process of testing, insurance coverage and turn-around-time for results. We discussed the implications of a negative, positive, carrier and/or variant of uncertain significant result. We recommended Chelsea Mcintyre pursue genetic testing for a panel that includes genes associated with breast, pancreatic, and prostate cancer.  ? ?Chelsea Mcintyre elected to have [employer]. The CustomNext-Cancer+RNAinsight panel offered by [facility] includes sequencing and rearrangement analysis for the following 47 genes: APC, ATM, AXIN2, BARD1, BMPR1A, BRCA1, BRCA2, BRIP1, CDH1, CDK4, CDKN2A, CHEK2, CTNNA1, DICER1, EPCAM, GREM1, HOXB13, KIT, MEN1, MLH1, MSH2, MSH3, MSH6, MUTYH, NBN, NF1, NTHL1, PALB2, PDGFRA, PMS2, POLD1, POLE, PTEN, RAD50, RAD51C, RAD51D, SDHA, SDHB, SDHC, SDHD, SMAD4, SMARCA4, STK11, TP53, TSC1, TSC2, and VHL.  RNA data is routinely analyzed for use in variant interpretation for all genes. ? ?Based on Chelsea Mcintyre's personal and family history of cancer, she meets medical criteria for genetic testing. Despite that she meets  criteria, she may still have an out of pocket cost. We discussed that if her out of pocket cost for testing is over $100, the laboratory will call and confirm whether she wants to proceed with testing.  If the

## 2021-09-04 NOTE — Assessment & Plan Note (Signed)
08/28/2021:Screening mammogram detected calcifications in the right breast UOQ spanning 1.4 cm, stereotactic biopsy revealed low-grade to intermediate grade DCIS with calcifications and necrosis, ER positive ? ?Pathology review: I discussed with the patient the difference between DCIS and invasive breast cancer. It is considered a precancerous lesion. DCIS is classified as a 0. It is generally detected through mammograms as calcifications. We discussed the significance of grades and its impact on prognosis. We also discussed the importance of ER and PR receptors and their implications to adjuvant treatment options. Prognosis of DCIS dependence on grade, comedo necrosis. It is anticipated that if not treated, 20-30% of DCIS can develop into invasive breast cancer. ? ?Recommendation: ?1. Breast conserving surgery ?2.Followed by antiestrogen therapy with tamoxifen ?5 years ?Radiation oncology felt that adjuvant radiation is of much benefit given her age and favorable subtype ? ?Tamoxifen counseling: We discussed the risks and benefits of tamoxifen. These include but not limited to insomnia, hot flashes, mood changes, vaginal dryness, and weight gain. Although rare, serious side effects including endometrial cancer, risk of blood clots were also discussed. We strongly believe that the benefits far outweigh the risks. Patient understands these risks and consented to starting treatment. Planned treatment duration is 5 years. ? ?Return to clinic after surgery to discuss the final pathology report and come up with an adjuvant treatment plan. ? ? ?

## 2021-09-05 ENCOUNTER — Other Ambulatory Visit: Payer: Self-pay

## 2021-09-05 ENCOUNTER — Encounter (HOSPITAL_BASED_OUTPATIENT_CLINIC_OR_DEPARTMENT_OTHER): Payer: Self-pay | Admitting: General Surgery

## 2021-09-05 ENCOUNTER — Telehealth: Payer: Self-pay | Admitting: Hematology and Oncology

## 2021-09-05 NOTE — Telephone Encounter (Signed)
.  Called patient to schedule appointment per 4/20 inbasket, patient is aware of date and time.  Calendar mailed to pt per daughter request ?

## 2021-09-06 ENCOUNTER — Telehealth: Payer: Self-pay | Admitting: *Deleted

## 2021-09-06 ENCOUNTER — Encounter: Payer: Self-pay | Admitting: *Deleted

## 2021-09-06 NOTE — Telephone Encounter (Signed)
Called pt regarding [place] from 09/04/21. Request return with questions or needs. Contact information provided. ?

## 2021-09-09 ENCOUNTER — Telehealth: Payer: Self-pay | Admitting: Emergency Medicine

## 2021-09-09 DIAGNOSIS — D0511 Intraductal carcinoma in situ of right breast: Secondary | ICD-10-CM

## 2021-09-09 NOTE — Telephone Encounter (Signed)
MTG-015 - Tissue and Bodily Fluids: Translational Medicine: Discovery and Evaluation of Biomarkers/Pharmacogenomics for the Diagnosis and Personalized Management of Patients  ? ?Contacted patient/her daughter to f/u on potential interest in study.  Pt is interested in participating.  Scheduled for her to come in after appts with [facility] on 09/12/21 around 1230 for consents and lab draw.  Pt denies any questions/concerns at this time.  Appts and orders are in. ? ?Wells Guiles 'Learta Codding' Phung Kotas, RN, BSN ?Clinical Research Nurse I ?09/09/21 ?12:06 PM ? ?

## 2021-09-11 NOTE — Progress Notes (Signed)
? ?Patient Care Team: ?Raina Mina., MD as PCP - General (Internal Medicine) ?Rockwell Germany, RN as Oncology Nurse Navigator ?Mauro Kaufmann, RN as Oncology Nurse Navigator ?Stark Klein, MD as Consulting Physician (General Surgery) ?Nicholas Lose, MD as Consulting Physician (Hematology and Oncology) ?Gery Pray, MD as Consulting Physician (Radiation Oncology) ? ?DIAGNOSIS:  ?Encounter Diagnosis  ?Name Primary?  ? Ductal carcinoma in situ (DCIS) of right breast Yes  ? ? ?SUMMARY OF ONCOLOGIC HISTORY: ?Oncology History  ?Ductal carcinoma in situ (DCIS) of right breast  ?08/28/2021 Initial Diagnosis  ? Screening mammogram detected calcifications in the right breast UOQ spanning 1.4 cm, stereotactic biopsy revealed low-grade to intermediate grade DCIS with calcifications and necrosis, ER positive ?  ?09/04/2021 Cancer Staging  ? Staging form: Breast, AJCC 8th Edition ?- Clinical stage from 09/04/2021: Stage 0 (cTis (DCIS), cN0, cM0, G2, ER+, PR+, HER2: Not Assessed) - Signed by Nicholas Lose, MD on 09/04/2021 ?Stage prefix: Initial diagnosis ?Histologic grading system: 3 grade system ? ?  ? Genetic Testing  ? [facility] CustomNext Panel was Negative. Report date is 09/16/2021. ? ?The CustomNext-Cancer+RNAinsight panel offered by [facility] includes sequencing and rearrangement analysis for the following 47 genes:  APC, ATM, AXIN2, BARD1, BMPR1A, BRCA1, BRCA2, BRIP1, CDH1, CDK4, CDKN2A, CHEK2, CTNNA1, DICER1, EPCAM, GREM1, HOXB13, KIT, MEN1, MLH1, MSH2, MSH3, MSH6, MUTYH, NBN, NF1, NTHL1, PALB2, PDGFRA, PMS2, POLD1, POLE, PTEN, RAD50, RAD51C, RAD51D, SDHA, SDHB, SDHC, SDHD, SMAD4, SMARCA4, STK11, TP53, TSC1, TSC2, and VHL.  RNA data is routinely analyzed for use in variant interpretation for all genes. ?  ?09/13/2021 Surgery  ? Right lumpectomy: No residual DCIS or invasive cancer at the site of biopsy.  ADH, based on biopsy material, DCIS grade 2 ER 100%, PR 95% ?Left axillary soft tissue mass dissection: 0/5 lymph  nodes in the left axilla ?  ? ? ?CHIEF COMPLIANT:  Follow-up after surgery. ? ? ?INTERVAL HISTORY: CREASIE LACOSSE is a 88 y.o. female is here because of recent diagnosis of right breast Dcis. She presents to the clinic today for a follow-up.  She reports mild to moderate discomfort for which she took some pain medication and she appears to be doing quite well. ? ?ALLERGIES:  is allergic to hrt base [hormone cream base], sulfa drugs cross reactors, gluten meal, tape, ace inhibitors, latex, lisinopril, nsaids, statins, sulphasomidine, amlodipine, and barbiturates. ? ?MEDICATIONS:  ?Current Outpatient Medications  ?Medication Sig Dispense Refill  ? amoxicillin (AMOXIL) 500 MG capsule Take 2,000 mg by mouth See admin instructions. Take 4 capsules (2000 mg) by mouth 1 hour prior to dental work    ? ASCORBIC ACID PO Take 1 tablet by mouth daily.    ? calcium carbonate (OS-CAL - DOSED IN MG OF ELEMENTAL CALCIUM) 1250 (500 Ca) MG tablet calcium carbonate 500 mg calcium (1,250 mg) tablet ?  1 by oral route.    ? Cholecalciferol 25 MCG (1000 UT) tablet cholecalciferol (vitamin D3) 25 mcg (1,000 unit) tablet ?  1000 units by oral route.    ? clopidogrel (PLAVIX) 75 MG tablet Take 37.5 mg by mouth daily.    ? diclofenac Sodium (VOLTAREN) 1 % GEL Apply topically.    ? docusate sodium (COLACE) 100 MG capsule Take 1 capsule (100 mg total) by mouth 2 (two) times daily. 60 capsule 0  ? ezetimibe (ZETIA) 10 MG tablet Take 10 mg by mouth daily.    ? hydrochlorothiazide (MICROZIDE) 12.5 MG capsule Take 1 capsule (12.5 mg total) by mouth daily.  30 capsule 0  ? losartan (COZAAR) 25 MG tablet Take 12.5 mg by mouth 2 (two) times daily.   0  ? Multiple Vitamin (MULTIVITAMIN WITH MINERALS) TABS tablet Take 1 tablet by mouth daily. Women's Multivitamin    ? nadolol (CORGARD) 20 MG tablet Take 10 mg by mouth 2 (two) times daily with breakfast and lunch.     ? Omega 3-6-9 Fatty Acids (TRIPLE OMEGA-3-6-9) CAPS Take 1 capsule by mouth daily.     ? ?No current facility-administered medications for this visit.  ? ? ?PHYSICAL EXAMINATION: ?ECOG PERFORMANCE STATUS: 1 - Symptomatic but completely ambulatory ? ?Vitals:  ? 09/25/21 0953  ?BP: (!) 148/77  ?Pulse: 87  ?Resp: 18  ?Temp: 97.8 ?F (36.6 ?C)  ?SpO2: 100%  ? ?Filed Weights  ? 09/25/21 0953  ?Weight: 159 lb 6.4 oz (72.3 kg)  ? ?  ? ?LABORATORY DATA:  ?I have reviewed the data as listed ? ?  Latest Ref Rng & Units 09/04/2021  ?  8:17 AM 04/07/2019  ? 10:15 AM 10/21/2018  ? 11:18 AM  ?CMP  ?Glucose 70 - 99 mg/dL 92   86   93    ?BUN 8 - 23 mg/dL _0 ?Creatinine 0.44 - 1.00 mg/dL 0.83   0.80   0.69    ?Sodium 135 - 145 mmol/L 143   137   141    ?Potassium 3.5 - 5.1 mmol/L 4.1   3.6   3.6    ?Chloride 98 - 111 mmol/L 105   101   105    ?CO2 22 - 32 mmol/L 32   26   29    ?Calcium 8.9 - 10.3 mg/dL 9.7   9.2   8.8    ?Total Protein 6.5 - 8.1 g/dL 7.0      ?Total Bilirubin 0.3 - 1.2 mg/dL 0.6      ?Alkaline Phos 38 - 126 U/L 81      ?AST 15 - 41 U/L 21      ?ALT 0 - 44 U/L 14      ? ? ?Lab Results  ?Component Value Date  ? WBC 5.0 09/04/2021  ? HGB 14.0 09/04/2021  ? HCT 42.6 09/04/2021  ? MCV 92.4 09/04/2021  ? PLT 198 09/04/2021  ? NEUTROABS 3.3 09/04/2021  ? ? ?ASSESSMENT & PLAN:  ?Ductal carcinoma in situ (DCIS) of right breast ?09/13/2021: ?Right lumpectomy: No residual DCIS or invasive cancer at the site of biopsy.  ADH, based on biopsy material, DCIS grade 2 ER 100%, PR 95% ?Left axillary soft tissue mass dissection: 0/5 lymph nodes in the left axilla ? ?Pathology counseling: I discussed the final pathology report of the patient provided  a copy of this report. I discussed the margins.  We also discussed the final staging along with previously performed ER/PR testing. ? ?Treatment plan: ?Given her age and history of stroke we discussed the pros and cons of doing antiestrogen therapy and decided that she would not get much benefit. ?Radiation had previously informed her that there is no role for  radiation. ? ?Return to clinic on an as-needed basis.  She will follow-up with her surgeon for annual checkups.  I sent an order for annual mammogram for April 2024. ? ? ? ?Orders Placed This Encounter  ?Procedures  ? MM DIAG BREAST TOMO BILATERAL  ?  Standing Status:   Future  ?  Standing Expiration Date:   09/26/2022  ?  Order Specific Question:   Reason for Exam (SYMPTOM  OR DIAGNOSIS REQUIRED)  ?  Answer:   history of dcis  ?  Order Specific Question:   Preferred imaging location?  ?  Answer:   External  ?  Comments:   [facility]  ? ?The patient has a good understanding of the overall plan. she agrees with it. she will call with any problems that may develop before the next visit here. ?Total time spent: 30 mins including face to face time and time spent for planning, charting and co-ordination of care ? ? Harriette Ohara, MD ?09/25/21 ? ? ? I Gardiner Coins am scribing for Dr. Lindi Adie ? ?I have reviewed the above documentation for accuracy and completeness, and I agree with the above. ?  ?

## 2021-09-12 ENCOUNTER — Encounter (HOSPITAL_BASED_OUTPATIENT_CLINIC_OR_DEPARTMENT_OTHER)
Admission: RE | Admit: 2021-09-12 | Discharge: 2021-09-12 | Disposition: A | Discharge status: Home / Self Care | Payer: Medicare PPO | Source: Ambulatory Visit | Attending: General Surgery | Admitting: General Surgery

## 2021-09-12 ENCOUNTER — Encounter: Payer: Self-pay | Admitting: Emergency Medicine

## 2021-09-12 ENCOUNTER — Inpatient Hospital Stay: Payer: Medicare PPO

## 2021-09-12 ENCOUNTER — Inpatient Hospital Stay: Payer: Medicare PPO | Admitting: Emergency Medicine

## 2021-09-12 DIAGNOSIS — C50411 Malignant neoplasm of upper-outer quadrant of right female breast: Secondary | ICD-10-CM | POA: Diagnosis present

## 2021-09-12 DIAGNOSIS — Z170 Estrogen receptor positive status [ER+]: Secondary | ICD-10-CM | POA: Diagnosis not present

## 2021-09-12 DIAGNOSIS — R920 Mammographic microcalcification found on diagnostic imaging of breast: Secondary | ICD-10-CM | POA: Diagnosis not present

## 2021-09-12 DIAGNOSIS — Z7902 Long term (current) use of antithrombotics/antiplatelets: Secondary | ICD-10-CM | POA: Diagnosis not present

## 2021-09-12 DIAGNOSIS — D0511 Intraductal carcinoma in situ of right breast: Secondary | ICD-10-CM

## 2021-09-12 DIAGNOSIS — Z8673 Personal history of transient ischemic attack (TIA), and cerebral infarction without residual deficits: Secondary | ICD-10-CM | POA: Diagnosis not present

## 2021-09-12 DIAGNOSIS — N6041 Mammary duct ectasia of right breast: Secondary | ICD-10-CM | POA: Diagnosis not present

## 2021-09-12 LAB — RESEARCH LABS

## 2021-09-12 NOTE — Research (Signed)
MTG-015 - Tissue and Bodily Fluids: Translational Medicine: Discovery and Evaluation of Biomarkers/Pharmacogenomics for the Diagnosis and Personalized Management of Patients  ? ?ELIGIBILITY CHECK ? ?Confirmed verbally the following with the patient today (09/12/21) before consent: ?- Patient has never had treatment for this or any previous cancer.  The patient confirms a history of pre-cancerous and non-melanoma skin issues on nose and back. ?- Patient has no prior cancer diagnosis. ?- Patient has not had a blood transfusion within the last 30 days. ?- Patient has not any non-cancer system immune modulation therapy within last 60 days. ?- Patient has no history of organ transplant, current or prior pregnancy within last 12 months.  Pt has not had  ?- Patient has ability and is willing to give informed consent. ?- Patient has no current active Covid-19 infection. ?- Patient has no history/current HIV, Hepatitis (A/D/E), TB, prion disorder, or other infectious pathogen. ?  ?Patient agreed to f/u call 12-14 months post enrollment. ?  ?Patient gave blood sample today, received $50 gift card. ? ?Wells Guiles 'Learta Codding' Evalene Vath, RN, BSN ?Clinical Research Nurse I ?09/12/21 ?1:50 PM ? ?

## 2021-09-12 NOTE — Research (Signed)
MTG-015 - Tissue and Bodily Fluids: Translational Medicine: Discovery and Evaluation of Biomarkers/Pharmacogenomics for the Diagnosis and Personalized Management of Patients  ? ?MT 3505-A ? ?Patient Chelsea Mcintyre, was registered to the above listed study, assigned study #[id number]. Randomization is not required for this study. ? ?Wells Guiles 'Learta Codding' Breia Ocampo, RN, BSN ?Clinical Research Nurse I ?09/12/21 ?4:00 PM ? ? ?

## 2021-09-12 NOTE — Research (Signed)
MTG-015 - Tissue and Bodily Fluids: Translational Medicine: Discovery and Evaluation of Biomarkers/Pharmacogenomics for the Diagnosis and Personalized Management of Patients  ? ?This Nurse has reviewed this patient's inclusion and exclusion criteria as a second review and confirms Chelsea Mcintyre is eligible for study participation. Patient may continue with enrollment. ? ?Vickii Penna, RN, BSN, CPN ?Clinical Research Nurse I ?[phone] ? ?09/12/2021 1:32 PM ? ?

## 2021-09-12 NOTE — Research (Signed)
MTG-015 - Tissue and Bodily Fluids: Translational Medicine: Discovery and Evaluation of Biomarkers/Pharmacogenomics for the Diagnosis and Personalized Management of Patients  ? ?MT 3505-A ? ?CONSENT ? ?Patient Chelsea Mcintyre was identified by MD Lindi Adie as a potential candidate for the above listed study.  This Clinical Research Nurse met with Chelsea Mcintyre, [name] on 09/12/21 in a manner and location that ensures patient privacy to discuss participation in the above listed research study.  Patient is Unaccompanied.  Patient was previously provided with informed consent documents.  Patient confirmed they have read the informed consent documents. ? ?As outlined in the informed consent form, this Nurse and Virgilio Frees discussed the purpose of the research study, the investigational nature of the study, study procedures and requirements for study participation, potential risks and benefits of study participation, as well as alternatives to participation.  This study is not blinded or double-blinded. The patient understands participation is voluntary and they may withdraw from study participation at any time.  This study does not involve randomization.  This study does not involve an investigational drug or device. This study does not involve a placebo. Patient understands enrollment is pending full eligibility review.  ? ?Confidentiality and how the patient's information will be used as part of study participation were discussed.  Patient was informed there is reimbursement provided for their time and effort spent on trial participation.  The patient is encouraged to discuss research study participation with their insurance provider to determine what costs they may incur as part of study participation, including research related injury.   ? ?All questions were answered to patient's satisfaction.  The informed consent and separate HIPAA Authorization was reviewed page by page.  The patient's mental and  emotional status is appropriate to provide informed consent, and the patient verbalizes an understanding of study participation.  Patient has agreed to participate in the above listed research study and has voluntarily signed the informed consent version date 04/08/2021 and separate HIPAA Authorization, version date 04/08/21 (revised 04/08/2021)  on 09/12/21 at 1:07 PM.  The patient was provided with a copy of the signed informed consent form and separate HIPAA Authorization for their reference.  No study specific procedures were obtained prior to the signing of the informed consent document.  Approximately 15 minutes were spent with the patient reviewing the informed consent documents.  Patient was not requested to complete a Release of Information form. ? ?Wells Guiles 'Learta Codding' Eagle Pitta, RN, BSN ?Clinical Research Nurse I ?09/12/21 ?1:55 PM ? ? ?

## 2021-09-12 NOTE — Research (Signed)
MTG-015 - Tissue and Bodily Fluids: Translational Medicine: Discovery and Evaluation of Biomarkers/Pharmacogenomics for the Diagnosis and Personalized Management of Patients  ? ?Data Collection Questions Worksheet ? ?Date and time of last meal prior to blood collection: 1015 am ? ?Does the patient drink beer, wine, or liquor? No ? ?Does the patient smoke? Former, quit [phone] (unsure of exact date), cigarettes only, 2 years smoked, 1/2 pack per day, 1 pack year ? ?Menopausal status- Postmenopausal with date of LMP Aug 19, 1971 (hysterectomy) ? ?Family history of cancer in immediate family: ?Mother, leukemia, 1990 diagnosed, deceased ?Maternal grandmother, breast, 1937/08/18 diagnosed, deceased ?Father, lung, 1987 diagnosed, deceased ?Paternal aunt, breast, 1992 diagnosed, deceased ?Maternal aunt, pancreatic, unknown date of diagnosis, deceased ?Maternal uncle, prostate, unknown date of diagnosis, deceased ? ?Vaccinated for Covid-19? Yes, Moderna manufacturer, 3 doses total, last dose 07/19/2019 ? ?Have you ever tested positive for COVID? Yes ?Date of positive test- July 2022 ?Date last COVID test taken- 07/19/2019 ? ?Other vaccinations in past year? None ? ?Wells Guiles 'Learta Codding' Neysa Bonito, RN, BSN ?Clinical Research Nurse I ?09/12/21 ?2:37 PM ? ?

## 2021-09-12 NOTE — H&P (Signed)
? ?REFERRING PHYSICIAN: Luan Pulling, MD ? ?PROVIDER: Georgianne Fick, MD ? ?Care Team: Patient Care Team: ?Raina Mina, MD as PCP - General (Internal Medicine) ?Georgianne Fick, MD as Consulting Provider (Surgical Oncology) ?Rulon Eisenmenger, MD (Hematology and Oncology) ?Blair Promise, MD (Radiation Oncology) ?Allyson Sabal, MD (Radiology)  ? ?MRN: [id number] ?DOB: 01-23-1933 ?DATE OF ENCOUNTER: 09/04/2021 ? ?Subjective  ? ?Chief Complaint: Breast Cancer ? ? ?History of Present Illness: ?Chelsea Mcintyre is a 88 y.o. female who is seen today as an office consultation at the request of Dr. Lindi Adie for evaluation of Breast Cancer ? ?Pt presents with new diagnosis of right breast cancer 08/2021. She had a question of a lump in the left breast/axilla. Diagnostic imaging showed a 1.4 cm of calcs in the UOQ of the right breast. Core needle biopsy showed intermediate grade DCIS with necrosis.  ? ?She is a retired Pharmacist, [employer] education. Her daughter is a pediatrician and she has a sister who is a Marine scientist.  ? ?She had menarche at age 63 and menopause at age 38 with hysterectomy She is a G40P2 with first child at age 51. She used OCPs briefly, but couldn't tolerate them.  ? ?She has had issues with anesthesia before where she had a code event. 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  ? ?Diagnostic mammogram: [facility] 08/16/3032 ?1.4 cm grouped fine linear calcifications in the right breast UOQ, anterior middle depth are suspicious for malignancy. Palpable area is lipoma vs accessory fat pad in axilla.  ? ?Pathology core needle biopsy: 08/28/21  ?DUCTAL CARCINOMA IN SITU (DCIS), APOCRINE TYPE, GRADE I-II (LOW-INTERMEDIATE), WITH CALCIFICATIONS ?AND CENTRAL NECROSIS. ? ?Receptors: ?ER prelim positive ?PR prelim positive ? ?Review of Systems: ?A complete review of systems was obtained from the patient. I have reviewed this information and discussed as  appropriate with the patient. See HPI as well for other ROS. ? ?Review of Systems  ?Constitutional: Positive for malaise/fatigue.  ?Night sweats  ?HENT: Positive for hearing loss, sinus pain and tinnitus.  ?Respiratory: Positive for shortness of breath.  ?Cardiovascular: Positive for leg swelling.  ?Gastrointestinal: Negative.  ?Genitourinary:  ?Incontinence  ?Musculoskeletal: Positive for back pain.  ?Neurological: Positive for weakness (post CVA).  ?Endo/Heme/Allergies:  ?On blood thinners  ?Psychiatric/Behavioral: Negative.  ?All other systems reviewed and are negative. ? ?Medical History: ?Past Medical History:  ?Diagnosis Date  ? History of stroke  ? Hyperlipidemia  ? ?Patient Active Problem List  ?Diagnosis  ? Malignant neoplasm of upper-outer quadrant of right breast in female, estrogen receptor positive (CMS-HCC)  ? Essential hypertension  ? History of CVA (cerebrovascular accident)  ? Hearing loss  ? Leg swelling  ? Diarrhea  ? Axillary mass, left  ? H/O complications due to general anesthesia  ? ?Past Surgical History:  ?Procedure Laterality Date  ? KYPHOPLASTY N/A 04/09/2011  ? JOINT REPLACEMENT Left 05/15/2015  ?Total hip arthroplasty  ? APPENDECTOMY  ? HYSTERECTOMY N/A  ?1976  ? ? ?Allergies  ?Allergen Reactions  ? Latex Hives and Rash  ? Sulfa (Sulfonamide Antibiotics) Rash and Shortness Of Breath  ? Adhesive Tape-Silicones Other (See Comments)  ?Layer of skin peels off and area becomes raw  ? Gluten Nausea And Vomiting and Nausea  ?Bothers her stomach ?Bothers her stomach ? ? Ace Inhibitors Cough and Other (See Comments)  ?Unknown ? ? Alendronate Other (See Comments)  ?unknown  ? Clarithromycin Other (See  Comments)  ?Unknown  ? Dicyclomine Other (See Comments)  ?Unknown  ? Irbesartan Other (See Comments)  ?unknown  ? Nsaids (Non-Steroidal Anti-Inflammatory Drug) Other (See Comments)  ? Statins-Hmg-Coa Reductase Inhibitors Other (See Comments)  ? Amlodipine Rash  ? ?Current Outpatient Medications:  ?  calcium carbonate 500 mg calcium (1,250 mg) tablet, calcium carbonate 500 mg calcium (1,250 mg) tablet 1 by oral route., Disp: , Rfl:  ? cholecalciferol (VITAMIN D3) 1000 unit tablet, cholecalciferol (vitamin D3) 25 mcg (1,000 unit) tablet 1000 units by oral route., Disp: , Rfl:  ? clopidogreL (PLAVIX) 75 mg tablet, clopidogrel 75 mg tablet TK 1 T PO QD, Disp: , Rfl:  ? ezetimibe (ZETIA) 10 mg tablet, ezetimibe 10 mg tablet TK 1 T PO QD, Disp: , Rfl:  ? hydroCHLOROthiazide (MICROZIDE) 12.5 mg capsule, hydrochlorothiazide 12.5 mg capsule TK 1 C PO D, Disp: , Rfl:  ? HYDROcodone-acetaminophen (NORCO) 5-325 mg tablet, hydrocodone 5 mg-acetaminophen 325 mg tablet TK 1 T PO Q 4 H PRF MODERATE PAIN, Disp: , Rfl:  ? losartan (COZAAR) 25 MG tablet, losartan 25 mg tablet TK 1 T PO BID, Disp: , Rfl:  ? multivitamin tablet, Take 1 tablet by mouth once daily, Disp: , Rfl:  ? nadoloL (CORGARD) 20 MG tablet, nadolol 20 mg tablet TK 1 T PO QD, Disp: , Rfl:  ? peg 400-propylene glycol (SYSTANE ULTRA) 0.4-0.3 % drops, Systane (PF) 0.4 %-0.3 % eye drops in a dropperette 2 [drp]s by ophthalmic route., Disp: , Rfl:  ? vitamin E 1000 UNIT capsule, Take by mouth, Disp: , Rfl:  ? ?Family History  ?Problem Relation Age of Onset  ? Hyperlipidemia (Elevated cholesterol) Father  ? Stroke Brother  ? High blood pressure (Hypertension) Brother  ? ? ?Social History  ? ?Tobacco Use  ?Smoking Status Former  ? Packs/day: 0.50  ? Years: 2.00  ? Pack years: 1.00  ? Types: Cigarettes  ?Smokeless Tobacco Never  ?Tobacco Comments  ?Quit "in college."  ? ? ?Social History  ? ?Socioeconomic History  ? Marital status: Widowed  ?Tobacco Use  ? Smoking status: Former  ?Packs/day: 0.50  ?Years: 2.00  ?Pack years: 1.00  ?Types: Cigarettes  ? Smokeless tobacco: Never  ? Tobacco comments:  ?Quit "in college."  ?Vaping Use  ? Vaping Use: Never used  ?Substance and Sexual Activity  ? Alcohol use: Never  ? Drug use: Never  ? ?Objective:  ? ?Vitals:  ?09/04/21 0930   ?BP: 131/71  ?Pulse: 69  ?Resp: 17  ?Temp: 36.4 ?C (97.6 ?F)  ?Weight: 70.9 kg (156 lb 4.8 oz)  ?Height: 165.1 cm ('5\' 5"'$ )  ? ?Body mass index is 26.01 kg/m?. ? ?Gen: No acute distress. Well nourished and well groomed.  ?Neurological: Alert and oriented to person, place, and time. Coordination normal.  ?Head: Normocephalic and atraumatic.  ?Eyes: Conjunctivae are normal. Pupils are equal, round, and reactive to light. No scleral icterus.  ?Neck: Normal range of motion. Neck supple. No tracheal deviation or thyromegaly present.  ?Cardiovascular: Normal rate, regular rhythm, normal heart sounds and intact distal pulses. Exam reveals no gallop and no friction rub. No murmur heard. ?Breast: some faint bruising right upper breast. No palpable masses. No LAD. No nipple retraction or nipple discharge. Left breast also benign. Left axillary mass present that is getting larger. It is 2x2 cm.  ?Respiratory: Effort normal. No respiratory distress. No chest wall tenderness. Breath sounds normal. No wheezes, rales or rhonchi.  ?GI: Soft. Bowel sounds are normal. The  abdomen is soft and nontender. There is no rebound and no guarding.  ?Musculoskeletal: Normal range of motion. Extremities are nontender.  ?Lymphadenopathy: No cervical, preauricular, postauricular or axillary adenopathy is present ?Skin: Skin is warm and dry. No rash noted. No diaphoresis. No erythema. No pallor. No clubbing, cyanosis, or edema.  ?Psychiatric: Normal mood and affect. Behavior is normal. Judgment and thought content normal.  ? ?Labs ?CBC and CMET normal 09/04/21 ? ?Assessment and Plan:  ? ?Malignant neoplasm of upper-outer quadrant of right breast in female, estrogen receptor positive (CMS-HCC) ?Pt has a new diagnosis of clinical stage 0 right breast cancer. ? ?Would recommend seed localized lumpectomy. ? ?Due to age, radiation would most likely be optional depending on final pathology. ? ?Antiestrogen tx would be recommended.  ? ?The surgical  procedure was described to the patient. I discussed the incision type and location and that we would need radiology involved on with a wire or seed marker and/or sentinel node.  ? ?The risks and benefits of the proce

## 2021-09-12 NOTE — Anesthesia Preprocedure Evaluation (Addendum)
Anesthesia Evaluation  ?Patient identified by MRN, date of birth, ID band ?Patient awake ? ? ? ?Reviewed: ?Allergy & Precautions, NPO status , Patient's Chart, lab work & pertinent test results ? ?Airway ?Mallampati: II ? ?TM Distance: >3 FB ?Neck ROM: Full ? ? ? Dental ? ?(+) Teeth Intact, Caps, Dental Advisory Given ?  ?Pulmonary ?former smoker,  ?  ?breath sounds clear to auscultation ? ? ? ? ? ? Cardiovascular ?hypertension, + dysrhythmias  ?Rhythm:Regular Rate:Normal ? ? ?  ?Neuro/Psych ?TIACVA   ? GI/Hepatic ?negative GI ROS, Neg liver ROS,   ?Endo/Other  ?negative endocrine ROS ? Renal/GU ?negative Renal ROS  ? ?  ?Musculoskeletal ? ?(+) Arthritis ,  ? Abdominal ?Normal abdominal exam  (+)   ?Peds ? Hematology ?  ?Anesthesia Other Findings ? ? Reproductive/Obstetrics ? ?  ? ? ? ? ? ? ? ? ? ? ? ? ? ?  ?  ? ? ? ? ? ? ?Anesthesia Physical ?Anesthesia Plan ? ?ASA: 3 ? ?Anesthesia Plan: General  ? ?Post-op Pain Management:   ? ?Induction: Intravenous ? ?PONV Risk Score and Plan: 4 or greater and Ondansetron and Treatment may vary due to age or medical condition ? ?Airway Management Planned: LMA ? ?Additional Equipment: None ? ?Intra-op Plan:  ? ?Post-operative Plan: Extubation in OR ? ?Informed Consent: I have reviewed the patients History and Physical, chart, labs and discussed the procedure including the risks, benefits and alternatives for the proposed anesthesia with the patient or authorized representative who has indicated his/her understanding and acceptance.  ? ? ? ?Dental advisory given ? ?Plan Discussed with: CRNA ? 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 There is a statement in the posting that patient needs a TIVA for anesthesia.  Although a TIVA is a reasonable option for this patient's anesthetic plan, it is not required or necessary based on the patient's history. She has received GETA several times in the recent past  and done well. Final plan per anesthesiologist on day of surgery. Patient was reassured that she will be cared for appropriately and that the medications we typically use for an anesthetic are not associated with an acute porphyria. Will avoid known triggering agents such as barbiturates, sulfonamides, ketorolac, etc. Norton Blizzard, MD  ?)  ? ? ? ? ?Anesthesia Quick Evaluation ? ?

## 2021-09-12 NOTE — Progress Notes (Signed)

## 2021-09-13 ENCOUNTER — Ambulatory Visit (HOSPITAL_BASED_OUTPATIENT_CLINIC_OR_DEPARTMENT_OTHER): Payer: Medicare PPO | Admitting: Anesthesiology

## 2021-09-13 ENCOUNTER — Encounter (HOSPITAL_BASED_OUTPATIENT_CLINIC_OR_DEPARTMENT_OTHER)
Admission: RE | Disposition: A | Discharge status: Home / Self Care | Payer: Self-pay | Source: Home / Self Care | Attending: General Surgery

## 2021-09-13 ENCOUNTER — Other Ambulatory Visit: Payer: Self-pay

## 2021-09-13 ENCOUNTER — Encounter (HOSPITAL_BASED_OUTPATIENT_CLINIC_OR_DEPARTMENT_OTHER): Payer: Self-pay | Admitting: General Surgery

## 2021-09-13 ENCOUNTER — Ambulatory Visit (HOSPITAL_BASED_OUTPATIENT_CLINIC_OR_DEPARTMENT_OTHER)
Admission: RE | Admit: 2021-09-13 | Discharge: 2021-09-13 | Disposition: A | Discharge status: Home / Self Care | Payer: Medicare PPO | Attending: General Surgery | Admitting: General Surgery

## 2021-09-13 DIAGNOSIS — I10 Essential (primary) hypertension: Secondary | ICD-10-CM

## 2021-09-13 DIAGNOSIS — R920 Mammographic microcalcification found on diagnostic imaging of breast: Secondary | ICD-10-CM | POA: Insufficient documentation

## 2021-09-13 DIAGNOSIS — Z170 Estrogen receptor positive status [ER+]: Secondary | ICD-10-CM | POA: Insufficient documentation

## 2021-09-13 DIAGNOSIS — R2232 Localized swelling, mass and lump, left upper limb: Secondary | ICD-10-CM

## 2021-09-13 DIAGNOSIS — C50411 Malignant neoplasm of upper-outer quadrant of right female breast: Secondary | ICD-10-CM | POA: Diagnosis not present

## 2021-09-13 DIAGNOSIS — Z7902 Long term (current) use of antithrombotics/antiplatelets: Secondary | ICD-10-CM | POA: Insufficient documentation

## 2021-09-13 DIAGNOSIS — N6041 Mammary duct ectasia of right breast: Secondary | ICD-10-CM | POA: Insufficient documentation

## 2021-09-13 DIAGNOSIS — Z8673 Personal history of transient ischemic attack (TIA), and cerebral infarction without residual deficits: Secondary | ICD-10-CM | POA: Insufficient documentation

## 2021-09-13 HISTORY — PX: MASS EXCISION: SHX2000

## 2021-09-13 HISTORY — DX: Cerebral infarction, unspecified: I63.9

## 2021-09-13 HISTORY — PX: BREAST LUMPECTOMY WITH RADIOACTIVE SEED LOCALIZATION: SHX6424

## 2021-09-13 SURGERY — BREAST LUMPECTOMY WITH RADIOACTIVE SEED LOCALIZATION
Anesthesia: General | Site: Breast | Laterality: Right

## 2021-09-13 MED ORDER — CEFAZOLIN SODIUM-DEXTROSE 2-4 GM/100ML-% IV SOLN
INTRAVENOUS | Status: AC
Start: 2021-09-13 — End: ?
  Filled 2021-09-13: qty 100

## 2021-09-13 MED ORDER — LIDOCAINE HCL (CARDIAC) PF 100 MG/5ML IV SOSY
PREFILLED_SYRINGE | INTRAVENOUS | Status: DC | PRN
  Administered 2021-09-13: 40 mg via INTRAVENOUS

## 2021-09-13 MED ORDER — CHLORHEXIDINE GLUCONATE CLOTH 2 % EX PADS: 6.0000 | MEDICATED_PAD | Freq: Once | CUTANEOUS | Status: DC

## 2021-09-13 MED ORDER — MIDAZOLAM HCL 2 MG/2ML IJ SOLN
INTRAMUSCULAR | Status: AC
  Filled 2021-09-13: qty 2

## 2021-09-13 MED ORDER — FENTANYL CITRATE (PF) 100 MCG/2ML IJ SOLN
INTRAMUSCULAR | Status: DC | PRN
  Administered 2021-09-13 (×3): 25 ug via INTRAVENOUS

## 2021-09-13 MED ORDER — LIDOCAINE-EPINEPHRINE (PF) 1 %-1:200000 IJ SOLN
INTRAMUSCULAR | Status: AC
  Filled 2021-09-13: qty 30

## 2021-09-13 MED ORDER — PHENYLEPHRINE 80 MCG/ML (10ML) SYRINGE FOR IV PUSH (FOR BLOOD PRESSURE SUPPORT)
PREFILLED_SYRINGE | INTRAVENOUS | Status: AC
  Filled 2021-09-13: qty 10

## 2021-09-13 MED ORDER — FENTANYL CITRATE (PF) 100 MCG/2ML IJ SOLN
INTRAMUSCULAR | Status: AC
  Filled 2021-09-13: qty 2

## 2021-09-13 MED ORDER — ATROPINE SULFATE 0.4 MG/ML IV SOLN
INTRAVENOUS | Status: AC
  Filled 2021-09-13: qty 1

## 2021-09-13 MED ORDER — BUPIVACAINE HCL (PF) 0.25 % IJ SOLN
INTRAMUSCULAR | Status: AC
  Filled 2021-09-13: qty 60

## 2021-09-13 MED ORDER — DEXAMETHASONE SODIUM PHOSPHATE 4 MG/ML IJ SOLN
INTRAMUSCULAR | Status: DC | PRN
  Administered 2021-09-13: 5 mg via INTRAVENOUS

## 2021-09-13 MED ORDER — ACETAMINOPHEN 500 MG PO TABS
1000.0000 mg | ORAL_TABLET | ORAL | Status: AC
  Administered 2021-09-13: 500 mg via ORAL

## 2021-09-13 MED ORDER — LACTATED RINGERS IV SOLN: INTRAVENOUS | Status: DC

## 2021-09-13 MED ORDER — LIDOCAINE-EPINEPHRINE (PF) 1 %-1:200000 IJ SOLN
INTRAMUSCULAR | Status: DC | PRN
  Administered 2021-09-13: 50 mL via SUBCUTANEOUS

## 2021-09-13 MED ORDER — ACETAMINOPHEN 500 MG PO TABS
ORAL_TABLET | ORAL | Status: AC
  Filled 2021-09-13: qty 2

## 2021-09-13 MED ORDER — ONDANSETRON HCL 4 MG/2ML IJ SOLN
INTRAMUSCULAR | Status: AC
  Filled 2021-09-13: qty 2

## 2021-09-13 MED ORDER — ONDANSETRON HCL 4 MG/2ML IJ SOLN
INTRAMUSCULAR | Status: DC | PRN
  Administered 2021-09-13: 4 mg via INTRAVENOUS

## 2021-09-13 MED ORDER — EPHEDRINE SULFATE (PRESSORS) 50 MG/ML IJ SOLN
INTRAMUSCULAR | Status: DC | PRN
Start: 2021-09-13 — End: 2021-09-13
  Administered 2021-09-13 (×3): 10 mg via INTRAVENOUS

## 2021-09-13 MED ORDER — EPHEDRINE 5 MG/ML INJ
INTRAVENOUS | Status: AC
  Filled 2021-09-13: qty 5

## 2021-09-13 MED ORDER — SUCCINYLCHOLINE CHLORIDE 200 MG/10ML IV SOSY
PREFILLED_SYRINGE | INTRAVENOUS | Status: AC
  Filled 2021-09-13: qty 10

## 2021-09-13 MED ORDER — 0.9 % SODIUM CHLORIDE (POUR BTL) OPTIME
TOPICAL | Status: DC | PRN
  Administered 2021-09-13: 300 mL

## 2021-09-13 MED ORDER — BUPIVACAINE-EPINEPHRINE (PF) 0.5% -1:200000 IJ SOLN
INTRAMUSCULAR | Status: AC
  Filled 2021-09-13: qty 30

## 2021-09-13 MED ORDER — BUPIVACAINE HCL (PF) 0.5 % IJ SOLN
INTRAMUSCULAR | Status: AC
Start: 2021-09-13 — End: ?
  Filled 2021-09-13: qty 30

## 2021-09-13 MED ORDER — FENTANYL CITRATE (PF) 100 MCG/2ML IJ SOLN
25.0000 ug | INTRAMUSCULAR | Status: DC | PRN
  Administered 2021-09-13 (×2): 25 ug via INTRAVENOUS

## 2021-09-13 MED ORDER — CEFAZOLIN SODIUM-DEXTROSE 2-4 GM/100ML-% IV SOLN
2.0000 g | INTRAVENOUS | Status: AC
  Administered 2021-09-13: 2 g via INTRAVENOUS

## 2021-09-13 MED ORDER — LIDOCAINE 2% (20 MG/ML) 5 ML SYRINGE
INTRAMUSCULAR | Status: AC
  Filled 2021-09-13: qty 5

## 2021-09-13 MED ORDER — PROPOFOL 10 MG/ML IV BOLUS
INTRAVENOUS | Status: DC | PRN
  Administered 2021-09-13: 100 mg via INTRAVENOUS

## 2021-09-13 MED ORDER — OXYCODONE HCL 5 MG PO TABS: 2.5000 mg | ORAL_TABLET | Freq: Four times a day (QID) | ORAL | 0 refills | Status: DC | PRN

## 2021-09-13 MED ORDER — DEXAMETHASONE SODIUM PHOSPHATE 10 MG/ML IJ SOLN
INTRAMUSCULAR | Status: AC
  Filled 2021-09-13: qty 1

## 2021-09-13 SURGICAL SUPPLY — 64 items
ADH SKN CLS APL DERMABOND .7 (GAUZE/BANDAGES/DRESSINGS) ×4
APL PRP STRL LF DISP 70% ISPRP (MISCELLANEOUS) ×4
BINDER BREAST LRG (GAUZE/BANDAGES/DRESSINGS) ×1 IMPLANT
BINDER BREAST MEDIUM (GAUZE/BANDAGES/DRESSINGS) IMPLANT
BINDER BREAST XLRG (GAUZE/BANDAGES/DRESSINGS) IMPLANT
BINDER BREAST XXLRG (GAUZE/BANDAGES/DRESSINGS) IMPLANT
BLADE HEX COATED 2.75 (ELECTRODE) ×2 IMPLANT
BLADE SURG 10 STRL SS (BLADE) ×3 IMPLANT
BLADE SURG 15 STRL LF DISP TIS (BLADE) ×2 IMPLANT
BLADE SURG 15 STRL SS (BLADE) ×3
BNDG COHESIVE 4X5 TAN ST LF (GAUZE/BANDAGES/DRESSINGS) IMPLANT
CANISTER SUC SOCK COL 7IN (MISCELLANEOUS) IMPLANT
CANISTER SUCT 1200ML W/VALVE (MISCELLANEOUS) ×1 IMPLANT
CHLORAPREP W/TINT 26 (MISCELLANEOUS) ×4 IMPLANT
CLIP TI LARGE 6 (CLIP) ×4 IMPLANT
CLIP TI MEDIUM 6 (CLIP) IMPLANT
COVER BACK TABLE 60X90IN (DRAPES) ×3 IMPLANT
COVER MAYO STAND STRL (DRAPES) ×4 IMPLANT
COVER PROBE W GEL 5X96 (DRAPES) ×3 IMPLANT
DERMABOND ADVANCED (GAUZE/BANDAGES/DRESSINGS) ×2
DERMABOND ADVANCED .7 DNX12 (GAUZE/BANDAGES/DRESSINGS) ×2 IMPLANT
DRAPE LAPAROSCOPIC ABDOMINAL (DRAPES) ×2 IMPLANT
DRAPE LAPAROTOMY 100X72 PEDS (DRAPES) IMPLANT
DRAPE UTILITY XL STRL (DRAPES) ×3 IMPLANT
DRSG PAD ABDOMINAL 8X10 ST (GAUZE/BANDAGES/DRESSINGS) ×2 IMPLANT
ELECT COATED BLADE 2.86 ST (ELECTRODE) ×3 IMPLANT
ELECT REM PT RETURN 9FT ADLT (ELECTROSURGICAL) ×3
ELECTRODE REM PT RTRN 9FT ADLT (ELECTROSURGICAL) ×2 IMPLANT
GAUZE SPONGE 4X4 12PLY STRL LF (GAUZE/BANDAGES/DRESSINGS) ×3 IMPLANT
GLOVE BIO SURGEON STRL SZ 6 (GLOVE) ×2 IMPLANT
GLOVE BIOGEL PI IND STRL 6.5 (GLOVE) ×2 IMPLANT
GLOVE BIOGEL PI INDICATOR 6.5 (GLOVE) ×2
GLOVE SURG SS PI 6.5 STRL IVOR (GLOVE) ×1 IMPLANT
GOWN STRL REUS W/ TWL LRG LVL3 (GOWN DISPOSABLE) ×2 IMPLANT
GOWN STRL REUS W/TWL 2XL LVL3 (GOWN DISPOSABLE) ×3 IMPLANT
GOWN STRL REUS W/TWL LRG LVL3 (GOWN DISPOSABLE) ×6
KIT MARKER MARGIN INK (KITS) ×3 IMPLANT
LIGHT WAVEGUIDE WIDE FLAT (MISCELLANEOUS) ×1 IMPLANT
NDL HYPO 25X1 1.5 SAFETY (NEEDLE) ×2 IMPLANT
NEEDLE HYPO 25X1 1.5 SAFETY (NEEDLE) ×3 IMPLANT
NS IRRIG 1000ML POUR BTL (IV SOLUTION) ×3 IMPLANT
PACK BASIN DAY SURGERY FS (CUSTOM PROCEDURE TRAY) ×3 IMPLANT
PACK UNIVERSAL I (CUSTOM PROCEDURE TRAY) ×1 IMPLANT
PENCIL SMOKE EVACUATOR (MISCELLANEOUS) ×3 IMPLANT
SLEEVE SCD COMPRESS KNEE MED (STOCKING) ×3 IMPLANT
SPIKE FLUID TRANSFER (MISCELLANEOUS) ×1 IMPLANT
SPONGE T-LAP 18X18 ~~LOC~~+RFID (SPONGE) ×3 IMPLANT
STAPLER VISISTAT 35W (STAPLE) IMPLANT
STOCKINETTE IMPERVIOUS LG (DRAPES) IMPLANT
STRIP CLOSURE SKIN 1/2X4 (GAUZE/BANDAGES/DRESSINGS) ×3 IMPLANT
SUT MNCRL AB 4-0 PS2 18 (SUTURE) ×3 IMPLANT
SUT SILK 2 0 SH (SUTURE) IMPLANT
SUT SILK 3 0 TIES 17X18 (SUTURE)
SUT SILK 3-0 18XBRD TIE BLK (SUTURE) IMPLANT
SUT VIC AB 2-0 SH 27 (SUTURE) ×6
SUT VIC AB 2-0 SH 27XBRD (SUTURE) ×2 IMPLANT
SUT VIC AB 3-0 SH 27 (SUTURE) ×6
SUT VIC AB 3-0 SH 27X BRD (SUTURE) ×2 IMPLANT
SYR BULB EAR ULCER 3OZ GRN STR (SYRINGE) ×3 IMPLANT
SYR CONTROL 10ML LL (SYRINGE) ×3 IMPLANT
TOWEL GREEN STERILE FF (TOWEL DISPOSABLE) ×3 IMPLANT
TRAY FAXITRON CT DISP (TRAY / TRAY PROCEDURE) ×3 IMPLANT
TUBE CONNECTING 20X1/4 (TUBING) ×1 IMPLANT
YANKAUER SUCT BULB TIP NO VENT (SUCTIONS) ×1 IMPLANT

## 2021-09-13 NOTE — Op Note (Signed)
Right Breast Radioactive seed localized lumpectomy, excision of left axillary mass ? ?Indications: This patient presents with history of right breast cancer, upper outer quadrant, cTis, receptors +/+ ? ?Pre-operative Diagnosis: right breast cancer, left axillary mass ? ?Post-operative Diagnosis: Same ? ?Surgeon: Stark Klein  ? ?Anesthesia: General endotracheal anesthesia ? ?ASA Class: 3 ? ?Procedure Details  ?The patient was seen in the Holding Room. The risks, benefits, complications, treatment options, and expected outcomes were discussed with the patient. The possibilities of bleeding, infection, the need for additional procedures, failure to diagnose a condition, and creating a complication requiring other procedures or operations were discussed with the patient. The patient concurred with the proposed plan, giving informed consent.  The site of surgery properly noted/marked. The patient was taken to Operating Room # 2, identified, and the procedure verified as right breast seed localized lumpectomy and left axillary mass ? ?The bilateral breast and chest were prepped and draped in standard fashion. The left side was addressed first.  An axillary incision was made over the mass like region in the axilla.  The fatty tissue in the axilla was taken with the cautery in the axilla.  The cavity was then irrigated and hemostasis was achieved with the cautery.  The skin was closed with 3-0 vicryl interrupted deep dermal sutures and 4-0 monocryl running subcuticular suture.  This was then dressed with dermabond.   ? ?A circumlinear incision in a prior incision was made near the previously placed radioactive seed.  Dissection was carried down around the point of maximum signal intensity. The cautery was used to perform the dissection.   The specimen was inked with the margin marker paint kit.    Specimen radiography confirmed inclusion of the mammographic lesion, the clip, and the seed.  The background signal in the  breast was zero.  Hemostasis was achieved with cautery.  Additional margins were taken at the superior, medial, lateral, and inferior borders of the cavity.  The cavity was marked with clips on each border other than the anterior border.  The wound was irrigated and closed with 3-0 vicryl interrupted deep dermal sutures and 4-0 monocryl running subcuticular suture.   ?   ?Sterile dressings were applied. At the end of the operation, all sponge, instrument, and needle counts were correct. ?  ?Findings: ?Seed, clip in specimen.  Anterior margin is skin, posterior margin is pectoralis.  Axillary mass was 5 cm.   ?  ?Estimated Blood Loss:  min ?        ?Specimens: left axillary mass, right breast tissue with seed, right breast medial margin, right breast superior margin, right breast inferior margin, right breast lateral margin.   ?        ?Complications:  None; patient tolerated the procedure well. ?        ?Disposition: PACU - hemodynamically stable. ?        ?Condition: stable ?  ? ?

## 2021-09-13 NOTE — Transfer of Care (Signed)
Immediate Anesthesia Transfer of Care Note ? ?Patient: Chelsea Mcintyre ? ?Procedure(s) Performed: RIGHT BREAST LUMPECTOMY WITH RADIOACTIVE SEED LOCALIZATION (Right: Breast) ?EXCISION OF LEFT AXILLARY MASS (Left: Axilla) ? ?Patient Location: PACU ? ?Anesthesia Type:General ? ?Level of Consciousness: sedated ? ?Airway & Oxygen Therapy: Patient Spontanous Breathing and Patient connected to face mask oxygen ? ?Post-op Assessment: Report given to RN and Post -op Vital signs reviewed and stable ? ?Post vital signs: Reviewed and stable ? ?Last Vitals:  ?Vitals Value Taken Time  ?BP    ?Temp    ?Pulse    ?Resp    ?SpO2    ? ? ?Last Pain:  ?Vitals:  ? 09/13/21 0644  ?TempSrc: Oral  ?PainSc: 0-No pain  ?   ? ?  ? ?Complications: No notable events documented. ?

## 2021-09-13 NOTE — Interval H&P Note (Signed)
History and Physical Interval Note: ? ?09/13/2021 ?8:16 AM ? ?Chelsea Mcintyre  has presented today for surgery, with the diagnosis of RIGHT BREAST CANCER.  The various methods of treatment have been discussed with the patient and family. After consideration of risks, benefits and other options for treatment, the patient has consented to  Procedure(s): ?RIGHT BREAST LUMPECTOMY WITH RADIOACTIVE SEED LOCALIZATION (Right) ?EXCISION OF LEFT AXILLARY MASS (Left) as a surgical intervention.  The patient's history has been reviewed, patient examined, no change in status, stable for surgery.  I have reviewed the patient's chart and labs.  Questions were answered to the patient's satisfaction.   ? ? ?Stark Klein ? ? ?

## 2021-09-13 NOTE — Anesthesia Postprocedure Evaluation (Signed)
Anesthesia Post Note ? ?Patient: Chelsea Mcintyre ? ?Procedure(s) Performed: RIGHT BREAST LUMPECTOMY WITH RADIOACTIVE SEED LOCALIZATION (Right: Breast) ?EXCISION OF LEFT AXILLARY MASS (Left: Axilla) ? ?  ? ?Patient location during evaluation: PACU ?Anesthesia Type: General ?Level of consciousness: awake and alert ?Pain management: pain level controlled ?Vital Signs Assessment: post-procedure vital signs reviewed and stable ?Respiratory status: spontaneous breathing, nonlabored ventilation, respiratory function stable and patient connected to nasal cannula oxygen ?Cardiovascular status: blood pressure returned to baseline and stable ?Postop Assessment: no apparent nausea or vomiting ?Anesthetic complications: no ? ? ?No notable events documented. ? ?Last Vitals:  ?Vitals:  ? 09/13/21 1117 09/13/21 1130  ?BP: (!) 168/110 (!) 163/78  ?Pulse: (!) 59 (!) 58  ?Resp: 12 (!) 58  ?Temp:  36.4 ?C  ?SpO2: 97% 95%  ?  ?Last Pain:  ?Vitals:  ? 09/13/21 1130  ?TempSrc:   ?PainSc: 3   ? ? ?  ?  ?  ?  ?  ?  ? ?Effie Berkshire ? ? ? ? ?

## 2021-09-13 NOTE — Progress Notes (Signed)
Anesthesia  consult per Dr. Elgie Congo, will proceed with surgery as scheduled.  ?

## 2021-09-13 NOTE — Discharge Instructions (Addendum)
[facility] ?Office Phone Number [phone] ? ?BREAST BIOPSY/ PARTIAL MASTECTOMY: POST OP INSTRUCTIONS ? ?Always review your discharge instruction sheet given to you by the facility where your surgery was performed. ? ?IF YOU HAVE DISABILITY OR FAMILY LEAVE FORMS, YOU MUST BRING THEM TO THE OFFICE FOR PROCESSING.  DO NOT GIVE THEM TO YOUR DOCTOR. ? ?Take 2 tylenol (acetominophen) three times a day for 3 days.  If you still have pain, add ibuprofen with food in between if able to take this (if you have kidney issues or stomach issues, do not take ibuprofen).  If both of those are not enough, add the narcotic pain pill.  If you find you are needing a lot of this overnight after surgery, call the next morning for a refill.   ? Prescriptions will not be filled after 5pm or on week-ends. ?Take your usually prescribed medications unless otherwise directed ?You should eat very light the first 24 hours after surgery, such as soup, crackers, pudding, etc.  Resume your normal diet the day after surgery. ?Most patients will experience some swelling and bruising in the breast.  Ice packs and a good support bra will help.  Swelling and bruising can take several days to resolve.  ?It is common to experience some constipation if taking pain medication after surgery.  Increasing fluid intake and taking a stool softener will usually help or prevent this problem from occurring.  A mild laxative (Milk of Magnesia or Miralax) should be taken according to package directions if there are no bowel movements after 48 hours. ?Unless discharge instructions indicate otherwise, you may remove your bandages 48 hours after surgery, and you may shower at that time.  You may have steri-strips (small skin tapes) in place directly over the incision.  These strips should be left on the skin at least for for 7-10 days.    ?ACTIVITIES:  You may resume regular daily activities (gradually increasing) beginning the next day.  Wearing a  good support bra or sports bra (or the breast binder) minimizes pain and swelling.  You may have sexual intercourse when it is comfortable. ?No heavy lifting for 1-2 weeks (not over around 10 pounds).  ?You may drive when you no longer are taking prescription pain medication, you can comfortably wear a seatbelt, and you can safely maneuver your car and apply brakes. ?RETURN TO WORK:  __________3-14 days depending on job. _______________ ?You should see your doctor in the office for a follow-up appointment approximately two weeks after your surgery.  Your doctor?s nurse will typically make your follow-up appointment when she calls you with your pathology report.  Expect your pathology report 3-4 business days after your surgery.  You may call to check if you do not hear from [place] after three days. ? ? ?WHEN TO CALL YOUR DOCTOR: ?Fever over 101.0 ?Nausea and/or vomiting. ?Extreme swelling or bruising. ?Continued bleeding from incision. ?Increased pain, redness, or drainage from the incision. ? ?The clinic staff is available to answer your questions during regular business hours.  Please don?t hesitate to call and ask to speak to one of the nurses for clinical concerns.  If you have a medical emergency, go to the nearest emergency room or call 911.  A surgeon from [facility] is always on call at the hospital. ? ?For further questions, please visit [email or web address]  ? ? ?No tylenol today until after 12:45pm if needed. ? ?

## 2021-09-13 NOTE — Anesthesia Procedure Notes (Signed)
Procedure Name: LMA Insertion ?Date/Time: 09/13/2021 8:36 AM ?Performed by: Willa Frater, CRNA ?Pre-anesthesia Checklist: Patient identified, Emergency Drugs available, Suction available and Patient being monitored ?Patient Re-evaluated:Patient Re-evaluated prior to induction ?Oxygen Delivery Method: Circle system utilized ?Preoxygenation: Pre-oxygenation with 100% oxygen ?Induction Type: IV induction ?Ventilation: Mask ventilation without difficulty ?LMA: LMA inserted ?LMA Size: 4.0 ?Number of attempts: 1 ?Airway Equipment and Method: Bite block ?Placement Confirmation: positive ETCO2 ?Tube secured with: Tape ?Dental Injury: Teeth and Oropharynx as per pre-operative assessment  ? ? ? ? ?

## 2021-09-16 LAB — SURGICAL PATHOLOGY

## 2021-09-18 ENCOUNTER — Encounter: Payer: Self-pay | Admitting: Genetic Counselor

## 2021-09-18 ENCOUNTER — Telehealth: Payer: Self-pay | Admitting: Genetic Counselor

## 2021-09-18 DIAGNOSIS — Z1379 Encounter for other screening for genetic and chromosomal anomalies: Secondary | ICD-10-CM

## 2021-09-18 HISTORY — DX: Encounter for other screening for genetic and chromosomal anomalies: Z13.79

## 2021-09-18 NOTE — Telephone Encounter (Signed)
I contacted Chelsea Mcintyre to discuss her genetic testing results. No pathogenic variants were identified in the 47 genes analyzed. Detailed clinic note to follow. ? ?The test report has been scanned into EPIC and is located under the Molecular Pathology section of the Results Review tab.  A portion of the result report is included below for reference.  ? ?Lucille Passy, MS, [place] ?Genetic Counselor ?[email or web address] ?(P) [phone] ? ? ?

## 2021-09-20 ENCOUNTER — Ambulatory Visit: Payer: Self-pay | Admitting: Genetic Counselor

## 2021-09-20 ENCOUNTER — Encounter: Payer: Self-pay | Admitting: *Deleted

## 2021-09-20 DIAGNOSIS — Z1379 Encounter for other screening for genetic and chromosomal anomalies: Secondary | ICD-10-CM

## 2021-09-20 NOTE — Progress Notes (Signed)
HPI:   ?Chelsea Mcintyre was previously seen in the [place] clinic due to a personal and family history of cancer and concerns regarding a hereditary predisposition to cancer. Please refer to our prior cancer genetics clinic note for more information regarding our discussion, assessment and recommendations, at the time. Chelsea Mcintyre recent genetic test results were disclosed to her, as were recommendations warranted by these results. These results and recommendations are discussed in more detail below. ? ?CANCER HISTORY:  ?Oncology History  ?Ductal carcinoma in situ (DCIS) of right breast  ?08/28/2021 Initial Diagnosis  ? Screening mammogram detected calcifications in the right breast UOQ spanning 1.4 cm, stereotactic biopsy revealed low-grade to intermediate grade DCIS with calcifications and necrosis, ER positive ?  ?09/04/2021 Cancer Staging  ? Staging form: Breast, AJCC 8th Edition ?- Clinical stage from 09/04/2021: Stage 0 (cTis (DCIS), cN0, cM0, G2, ER+, PR+, HER2: Not Assessed) - Signed by Nicholas Lose, MD on 09/04/2021 ?Stage prefix: Initial diagnosis ?Histologic grading system: 3 grade system ? ?  ? Genetic Testing  ? [facility] CustomNext Panel was Negative. Report date is 09/16/2021. ? ?The CustomNext-Cancer+RNAinsight panel offered by [facility] includes sequencing and rearrangement analysis for the following 47 genes:  APC, ATM, AXIN2, BARD1, BMPR1A, BRCA1, BRCA2, BRIP1, CDH1, CDK4, CDKN2A, CHEK2, CTNNA1, DICER1, EPCAM, GREM1, HOXB13, KIT, MEN1, MLH1, MSH2, MSH3, MSH6, MUTYH, NBN, NF1, NTHL1, PALB2, PDGFRA, PMS2, POLD1, POLE, PTEN, RAD50, RAD51C, RAD51D, SDHA, SDHB, SDHC, SDHD, SMAD4, SMARCA4, STK11, TP53, TSC1, TSC2, and VHL.  RNA data is routinely analyzed for use in variant interpretation for all genes. ?  ? ? ?FAMILY HISTORY:  ?We obtained a detailed, 4-generation family history.  Significant diagnoses are listed below: ?     ?Family History  ?Problem Relation Age of Onset  ? Acute  myelogenous leukemia Mother 68  ? Anesthesia problems Father    ? Lung cancer Father 78  ?      smoked  ? Pancreatic cancer Maternal Aunt    ?      dx. 48s  ? Prostate cancer Maternal Uncle    ?      dx. 60s, metastatic  ? Breast cancer Paternal Aunt    ?      dx. 3s  ? Breast cancer Paternal Grandmother    ?  ?  ?Ms. Hinton's mother was diagnosed with myelogenous leukemia at age 30, she died at age 68. Her maternal aunt was diagnosed with pancreatic cancer in her 32s, she is deceased. Her maternal uncle was diagnosed with metastatic prostate cancer in his 43s, he is deceased. Her father was diagnosed with lung cancer at age 1, he smoked, he died at age 63. Her paternal aunt was diagnosed with breast cancer in her 12s, she is deceased. Her paternal grandmother was diagnosed with breast cancer at an unknown age, she is deceased. Ms. Moler is unaware of previous family history of genetic testing for hereditary cancer risks. There is no reported Ashkenazi Jewish ancestry. ?  ?GENETIC TEST RESULTS:  ?The [facility] CustomNext Panel found no pathogenic mutations.  ? ?The CustomNext-Cancer+RNAinsight panel offered by [facility] includes sequencing and rearrangement analysis for the following 47 genes:  APC, ATM, AXIN2, BARD1, BMPR1A, BRCA1, BRCA2, BRIP1, CDH1, CDK4, CDKN2A, CHEK2, CTNNA1, DICER1, EPCAM, GREM1, HOXB13, KIT, MEN1, MLH1, MSH2, MSH3, MSH6, MUTYH, NBN, NF1, NTHL1, PALB2, PDGFRA, PMS2, POLD1, POLE, PTEN, RAD50, RAD51C, RAD51D, SDHA, SDHB, SDHC, SDHD, SMAD4, SMARCA4, STK11, TP53, TSC1, TSC2, and VHL.  RNA data is routinely  analyzed for use in variant interpretation for all genes. ? ?The test report has been scanned into EPIC and is located under the Molecular Pathology section of the Results Review tab.  A portion of the result report is included below for reference. Genetic testing reported out on 09/16/2021.  ? ? ? ? ? ? ?Even though a pathogenic variant was not identified, possible explanations for her  personal history of cancer may include: ?There may be no hereditary risk for cancer in the family. The cancers in Ms. Canby and/or her family may be due to other genetic or environmental factors. ?There may be a gene mutation in one of these genes that current testing methods cannot detect, but that chance is small. ?There could be another gene that has not yet been discovered, or that we have not yet tested, that is responsible for the cancer diagnoses in the family.  ? ?Therefore, it is important to remain in touch with cancer genetics in the future so that we can continue to offer Ms. Capek the most up to date genetic testing.   ? ?ADDITIONAL GENETIC TESTING:  ?We discussed with Ms. Sebree that her genetic testing was fairly extensive.  If there are genes identified to increase cancer risk that can be analyzed in the future, we would be happy to discuss and coordinate this testing at that time.   ? ?CANCER SCREENING RECOMMENDATIONS:  ?Ms. Rann's test result is considered negative (normal).  This means that we have not identified a hereditary cause for her personal and family history of cancer at this time.  ? ?An individual's cancer risk and medical management are not determined by genetic test results alone. Overall cancer risk assessment incorporates additional factors, including personal medical history, family history, and any available genetic information that may result in a personalized plan for cancer prevention and surveillance. Therefore, it is recommended she continue to follow the cancer management and screening guidelines provided by her oncology and primary healthcare provider. ? ?RECOMMENDATIONS FOR FAMILY MEMBERS:   ?Since she did not inherit a mutation in a cancer predisposition gene included on this panel, her children could not have inherited a mutation from her in one of these genes. ?Individuals in this family might be at some increased risk of developing cancer, over the general  population risk, due to the family history of cancer. We recommend women in this family have a yearly mammogram beginning at age 60, or 38 years younger than the earliest onset of cancer, an annual clinical breast exam, and perform monthly breast self-exams.  ? ?FOLLOW-UP:  ?Cancer genetics is a rapidly advancing field and it is possible that new genetic tests will be appropriate for her and/or her family members in the future. We encouraged her to remain in contact with cancer genetics on an annual basis so we can update her personal and family histories and let her know of advances in cancer genetics that may benefit this family.  ? ?Our contact number was provided. Ms. Dix questions were answered to her satisfaction, and she knows she is welcome to call us at anytime with additional questions or concerns.  ? ?Lucille Passy, MS, [place] ?Genetic Counselor ?[email or web address] ?(P) [phone] ? ? ?

## 2021-09-24 ENCOUNTER — Encounter (HOSPITAL_COMMUNITY): Payer: Self-pay

## 2021-09-25 ENCOUNTER — Other Ambulatory Visit: Payer: Self-pay

## 2021-09-25 ENCOUNTER — Inpatient Hospital Stay: Payer: Medicare PPO | Attending: Hematology and Oncology | Admitting: Hematology and Oncology

## 2021-09-25 VITALS — BP 148/77 | HR 87 | Temp 97.8°F | Resp 18 | Ht 63.0 in | Wt 159.4 lb

## 2021-09-25 DIAGNOSIS — Z170 Estrogen receptor positive status [ER+]: Secondary | ICD-10-CM | POA: Diagnosis not present

## 2021-09-25 DIAGNOSIS — Z79899 Other long term (current) drug therapy: Secondary | ICD-10-CM | POA: Diagnosis not present

## 2021-09-25 DIAGNOSIS — D0511 Intraductal carcinoma in situ of right breast: Secondary | ICD-10-CM | POA: Insufficient documentation

## 2021-09-25 NOTE — Assessment & Plan Note (Addendum)
09/13/2021: ?Right lumpectomy: No residual DCIS or invasive cancer at the site of biopsy.  ADH, based on biopsy material, DCIS grade 2 ER 100%, PR 95% ?Left axillary soft tissue mass dissection: 0/5 lymph nodes in the left axilla ? ?Pathology counseling: I discussed the final pathology report of the patient provided  a copy of this report. I discussed the margins.  We also discussed the final staging along with previously performed ER/PR testing. ? ?Treatment plan: ?Given her age and history of stroke we discussed the pros and cons of doing antiestrogen therapy and decided that she would not get much benefit. ?Radiation had previously informed her that there is no role for radiation. ? ?Return to clinic on an as-needed basis.  She will follow-up with her surgeon for annual checkups.  I sent an order for annual mammogram for April 2024. ?

## 2021-09-26 ENCOUNTER — Encounter: Payer: Self-pay | Admitting: *Deleted

## 2021-09-26 DIAGNOSIS — D0511 Intraductal carcinoma in situ of right breast: Secondary | ICD-10-CM

## 2021-09-27 ENCOUNTER — Encounter: Payer: Self-pay | Admitting: Genetic Counselor

## 2021-09-27 DIAGNOSIS — N6489 Other specified disorders of breast: Secondary | ICD-10-CM | POA: Insufficient documentation

## 2021-09-27 HISTORY — DX: Other specified disorders of breast: N64.89

## 2021-10-25 ENCOUNTER — Telehealth: Payer: Self-pay | Admitting: Hematology and Oncology

## 2021-10-25 NOTE — Telephone Encounter (Signed)
Called patient to schedule a SCP appointment with Wilber Bihari per 6/9 scheduling message. While on the phone, patients states she does not want to schedule a SCP appointment at this time. Patient states she feels like she does not need it. Made [employer] RN aware (secure chat) of this since the scheduling message was sent by her.

## 2021-12-02 DIAGNOSIS — I890 Lymphedema, not elsewhere classified: Secondary | ICD-10-CM

## 2021-12-02 HISTORY — DX: Lymphedema, not elsewhere classified: I89.0

## 2022-08-29 DIAGNOSIS — R4181 Age-related cognitive decline: Secondary | ICD-10-CM

## 2022-08-29 HISTORY — DX: Age-related cognitive decline: R41.81

## 2022-09-23 ENCOUNTER — Telehealth: Payer: Self-pay | Admitting: Medical Oncology

## 2022-09-23 NOTE — Telephone Encounter (Signed)
MTG-015 - Tissue and Bodily Fluids: Translational Medicine: Discovery and Evaluation of Biomarkers/Pharmacogenomics for the Diagnosis and Personalized Management of Patients    Outgoing call: 11:08 ( One year study follow-up)  LVMOM with patient regarding one-year follow-up to study participation. Call back number provided. Patient thanked.  Rexene Edison, RN, BSN, [facility] Clinical Research Nurse Lead 09/23/2022 11:12 AM

## 2022-09-24 ENCOUNTER — Telehealth: Payer: Self-pay | Admitting: Medical Oncology

## 2022-09-24 NOTE — Telephone Encounter (Signed)
MTG-015 - Tissue and Bodily Fluids: Translational Medicine: Discovery and Evaluation of Biomarkers/Pharmacogenomics for the Diagnosis and Personalized Management of Patients    Outgoing call: [phone] One year Follow-Up Call  Call to patient regarding study follow-up. I introduced myself and explained to patient the nature of this call. Patient stated she understood and that she had time to answer my questions.   Relapse/Progressive Disease History: Patient confirms no relapse or progression or new cancer diagnosis. Recent mammogram confirms this as well.   Medical Conditions: Patient's medical history reviewed and patient states no resolution to previously reported medical conditions and confirms no new medical issues to report.   Medications: Patient and I reviewed her medications and any changes. Patient confirms to continue same medications as reported in baseline assessments and has colace 100 mg that she takes as needed, but states she doesn't recall the last time she had to take any.   Patient was informed that this was the study's final assessment and that her study participation is now completed. Patient informed that the study provides a $50 gift card for her time and this nurse inquired how she wishes to receive this gift card. Patient informed we can mail it to her or she may pick it up at the clinic at her convenience. Patient was informed, should the card get lost in the mail, we are unable to replace it since the study only provides one gift card per patient at completion of study. Patient gave her verbal understanding and stated she would prefer to have it mailed to her. I informed patient, it may take up to 10 days for her to receive and to keep an eye out for it in the mail. Patient denied having any questions. Patient was thanked for her time and her contribution to the study and was encouraged to call Dr. Pamelia Hoit with any questions she may have.   Rexene Edison, RN, BSN,  [facility] Clinical Research Nurse Lead 09/24/2022 1:46 PM

## 2022-10-08 NOTE — Progress Notes (Signed)
Cardiology Office Note:    Date:  10/09/2022   ID:  Chelsea Mcintyre, Chelsea Mcintyre [birth year], MRN [id number]  PCP:  Gordan Payment., MD  Cardiologist:  Norman Herrlich, MD   Referring MD: Gordan Payment., MD  ASSESSMENT:    1. SVT (supraventricular tachycardia)   2. Malaise and fatigue   3. Primary hypertension   4. Stage 3 chronic kidney disease, unspecified whether stage 3a or 3b CKD (HCC)    PLAN:    In order of problems listed above:  I think she is best served by a beta-blocker that does not cause malaise and fatigue and she will transition to Toprol-XL I encouraged her to get a smart watch to pair with her phone so she can see if she is having breakthrough episodes of rapid heart rhythm.  She will avoid over-the-counter proarrhythmic drugs Continue ARB well-controlled hypertension Continue nonstatin therapy with history of stroke Stable CKD recent labs 08/29/2022 creatinine 0.9 GFR 62 cc/min  Next appointment 1 year   Medication Adjustments/Labs and Tests Ordered: Current medicines are reviewed at length with the patient today.  Concerns regarding medicines are outlined above.  No orders of the defined types were placed in this encounter.  No orders of the defined types were placed in this encounter.    Chief Complaint  Patient presents with   SVT    History of Present Illness:    Chelsea Mcintyre is a 89 y.o. female who is being seen today for the evaluation of abnormal event monitor with frequent episodes of SVT at the request of Gordan Payment., MD. 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  Other problems include hypertension stage IIIa CKD and hyperlipidemia with statin intolerance.  She was seen by [facility] cardiology  Dr. Beverely Pace 04/05/2018 for thoracic discomfort.  She had a recent event monitor through [facility] atrium showing the presence of frequent episodes of SVT. PRELIMINARY FINDINGS Patient had a min HR of 44 bpm, max HR of 184 bpm, and avg HR of 68 bpm. Predominant underlying rhythm was Sinus Rhythm. 111 Supraventricular Tachycardia runs occurred, the run with the fastest interval lasting 8 beats with a max rate of 184 bpm, the longest lasting 12.9 secs with an avg rate of 126 bpm. Isolated SVEs were frequent 14.6%, 102326 , SVE Couplets were rare <1.0%, 3470 , and SVE Triplets were rare <1.0%, 634 . Isolated VEs were occasional 1.4%, 9536 , VE Couplets were rare <1.0%, 5 , and VE Triplets were rare <1.0%, 2 .   Recent labs hemoglobin 14.0 08/29/2022 platelets 180,000 cholesterol 194 HDL 52 LDL 120 sodium 141 potassium 4.2 creatinine 0.9 GFR 62 cc/min  She is seen today along with her daughter Dr. Laural Mcintyre previously worked in [facility] She has a background history of SVT and has been on a beta-blocker and nadolol for decades Also has a history of previous stroke She has no history of heart failure atrial fibrillation or CAD Recently was seen with her PCP she has increasing fatigue at times of follow-up process seems to be affected and had continued palpitation resulting in the event monitor There were no episodes of atrial fibrillation He has had no syncope edema orthopnea chest pain Home blood pressure consistently runs in the  range of 120/60 Past Medical History:  Diagnosis Date   Arthritis    bilateral hips and bilateral shoulders.   Cancer (HCC)    skin back, nose   Complication of anesthesia    heart stopped with the anesthesia x1,  difficult to wake up   Dysrhythmia    if taking certain medications   Family history of adverse reaction to anesthesia    sister slow to awaken- "almost died last time"   Frequency of urination    Hypertension    Jaundice    as a child   MVP  (mitral valve prolapse)    Pneumonia 2009   hx   Porphyria variegata (HCC)    family history of pt thinks she might have as well   Recurrent upper respiratory infection (URI)    Dx 1st of september with sinus infection, placed on antibiotics at that time   Stroke [facility])    2018    Past Surgical History:  Procedure Laterality Date   ABDOMINAL HYSTERECTOMY  75   partial   APPENDECTOMY     BACK SURGERY  2012   kyphoplasty   BREAST LUMPECTOMY WITH RADIOACTIVE SEED LOCALIZATION Right 09/13/2021   Procedure: RIGHT BREAST LUMPECTOMY WITH RADIOACTIVE SEED LOCALIZATION;  Surgeon: Almond Lint, MD;  Location: [facility];  Service: General;  Laterality: Right;   BREAST SURGERY     1982,70rt   CARDIAC CATHETERIZATION     1994, detected MVP   ELBOW FRACTURE SURGERY Left 98,   hardware removed later in year   EYE SURGERY     bilateral cataract surgery 2007 with lens implant   fibroid cyst     ovary right removed 1966   fibroid tumor     1970, right breast removed   FRACTURE SURGERY     1988 shattered elbow left   HARDWARE REMOVAL     left elbow 1998   KYPHOPLASTY Bilateral 04/11/2019   Procedure: KYPHOPLASTY BILATERAL, THORACIC SEVEN- THORACIC EIGHT;  Surgeon: Tressie Stalker, MD;  Location[facility];  Service: Neurosurgery;  Laterality: Bilateral;  KYPHOPLASTY BILATERAL, THORACIC 7- THORACIC 8   MASS EXCISION Left 09/13/2021   Procedure: EXCISION OF LEFT AXILLARY MASS;  Surgeon: Almond Lint, MD;  Location: [facility];  Service: General;  Laterality: Left;   REVERSE SHOULDER ARTHROPLASTY Right 10/28/2018   Procedure: REVERSE SHOULDER ARTHROPLASTY;  Surgeon: Francena Hanly, MD;  Location: [facility]S;  Service: Orthopedics;  Laterality: Right;    TONSILLECTOMY  44   and adenoids   TOTAL HIP ARTHROPLASTY Left 05/15/2015   Procedure: LEFT TOTAL HIP ARTHROPLASTY ANTERIOR APPROACH;  Surgeon: Samson Frederic, MD;  Location: [facility] OR;  Service: Orthopedics;   Laterality: Left;    Current Medications:    Allergies:   Hrt base [hormone cream base], Sulfa drugs cross reactors, Gluten meal, Other, Tape, Ace inhibitors, Latex, Lisinopril, Nsaids, Statins, Sulphasomidine, Amlodipine, and Barbiturates   Social History   Socioeconomic History   Marital status: Widowed    Spouse name: Not on file   Number of children: Not on file   Years of education: Not on file   Highest education level: Not on file  Occupational History   Not on file  Tobacco Use   Smoking status: Former    Packs/day: 0.50    Years: 2.00    Additional pack years: 0.00    Total pack years: 1.00    Types: Cigarettes   Smokeless tobacco: Never   Tobacco comments:  off and on, non smoker presently  Vaping Use   Vaping Use: Never used  Substance and Sexual Activity   Alcohol use: No   Drug use: No   Sexual activity: Not on file  Other Topics Concern   Not on file  Social History Narrative   Not on file   Social Determinants of Health   Financial Resource Strain: Not on file  Food Insecurity: Not on file  Transportation Needs: Not on file  Physical Activity: Not on file  Stress: Not on file  Social Connections: Not on file     Family History: The patient's family history includes Acute myelogenous leukemia (age of onset: 18) in her mother; Anesthesia problems in her father; Breast cancer in her paternal aunt and paternal grandmother; Lung cancer (age of onset: 30) in her father; Pancreatic cancer in her maternal aunt; Prostate cancer in her maternal uncle.  ROS:   ROS Please see the history of present illness.     All other systems reviewed and are negative.  EKGs/Labs/Other Studies Reviewed:    The following studies were reviewed today:   Cardiac Studies & Procedures       ECHOCARDIOGRAM  ECHOCARDIOGRAM COMPLETE BUBBLE STUDY 10/21/2016  Narrative *[facility]* [address] [place], [place]  [address] [phone]  ------------------------------------------------------------------- Transthoracic Echocardiography  Patient:    Carita, Troy MR #:       [id number] Study Date: 10/21/2016 Gender:     F Age:        34 Height:     152.4 cm Weight:     76.9 kg BSA:        1.84 m^2 Pt. Status: Room:  SONOGRAPHER  [facility] ATTENDING    Enid Baas Kenna Gilbert, Radhika REFERRING    [place], [place] PERFORMING   [facility], [facility]  cc:  ------------------------------------------------------------------- LV EF: 65% -   70%  ------------------------------------------------------------------- Indications:      TIA 435.9.  ------------------------------------------------------------------- History:   PMH:  Dysrhythmia,cancer, mitral valve prolapse, pneumonia.  Risk factors:  Hypertension.  ------------------------------------------------------------------- Study Conclusions  - Left ventricle: The cavity size was normal. Wall thickness was increased in a pattern of mild LVH. There was focal basal hypertrophy. Systolic function was vigorous. The estimated ejection fraction was in the range of 65% to 70%. Features are consistent with a pseudonormal left ventricular filling pattern, with concomitant abnormal relaxation and increased filling pressure (grade 2 diastolic dysfunction). Doppler parameters are consistent with high ventricular filling pressure. - Mitral valve: Calcified annulus. Mildly thickened leaflets . - Right ventricle: The cavity size was normal. Wall thickness was normal. Systolic function was normal. - Atrial septum: Doppler and agitated saline contrast study showed no atrial level shunt, in the baseline state. - Pericardium, extracardiac: A trivial pericardial effusion was identified posterior to the heart.  ------------------------------------------------------------------- Study data:   Study status:  Routine.  Procedure:  The  patient reported no pain pre or post test. Transthoracic echocardiography. Image quality was adequate. Intravenous contrast (agitated saline) was administered.          Transthoracic echocardiography.  M-mode, complete 2D, spectral Doppler, and color Doppler.  Birthdate: Patient birthdate: 10-26-1932.  Age:  Patient is 89 yr old.  Sex: Gender: female.    BMI: 33.1 kg/m^2.  Blood pressure:     146/58 Patient status:  Inpatient.  Study date:  Study date: 10/21/2016. Study time: 08:31 AM.  Location:  Echo laboratory.  -------------------------------------------------------------------  ------------------------------------------------------------------- Left  ventricle:  The cavity size was normal. Wall thickness was increased in a pattern of mild LVH. There was focal basal hypertrophy. Systolic function was vigorous. The estimated ejection fraction was in the range of 65% to 70%. Images were inadequate for LV wall motion assessment. Features are consistent with a pseudonormal left ventricular filling pattern, with concomitant abnormal relaxation and increased filling pressure (grade 2 diastolic dysfunction). Doppler parameters are consistent with high ventricular filling pressure.  ------------------------------------------------------------------- Aortic valve:   Structurally normal valve.   Cusp separation was normal.  Doppler:  Transvalvular velocity was within the normal range. There was no stenosis. There was no regurgitation.    Peak velocity ratio of LVOT to aortic valve: 0.84. Valve area (Vmax): 2.65 cm^2. Indexed valve area (Vmax): 1.44 cm^2/m^2.    Peak gradient (S): 5 mm Hg.  ------------------------------------------------------------------- Aorta:  Aortic root: The aortic root was normal in size. Ascending aorta: The ascending aorta was normal in size.  ------------------------------------------------------------------- Mitral valve:   Calcified annulus. Mildly thickened  leaflets . Leaflet separation was normal.  Doppler:  Transvalvular velocity was within the normal range. There was no evidence for stenosis. There was trivial regurgitation.    Peak gradient (D): 4 mm Hg.  ------------------------------------------------------------------- Atrial septum:   Doppler and agitated saline contrast study showed no atrial level shunt, in the baseline state.  ------------------------------------------------------------------- Right ventricle:  The cavity size was normal. Wall thickness was normal. Systolic function was normal.  ------------------------------------------------------------------- Pulmonic valve:    Structurally normal valve.   Cusp separation was normal.  Doppler:  Transvalvular velocity was within the normal range. There was mild regurgitation.  ------------------------------------------------------------------- Tricuspid valve:   Structurally normal valve.   Leaflet separation was normal.  Doppler:  Transvalvular velocity was within the normal range. There was trivial regurgitation.  ------------------------------------------------------------------- Pulmonary artery:   The main pulmonary artery was normal-sized. Systolic pressure could not be accurately estimated.  ------------------------------------------------------------------- Right atrium:  The atrium was normal in size.  ------------------------------------------------------------------- Pericardium:  A trivial pericardial effusion was identified posterior to the heart.  ------------------------------------------------------------------- Systemic veins: Inferior vena cava: The vessel was dilated. The respirophasic diameter changes were blunted (< 50%), consistent with elevated central venous pressure.  ------------------------------------------------------------------- Measurements  Left ventricle                           Value          Reference LV ID, ED, PLAX chordal           (L)     40.7  mm       43 - 52 LV ID, ES, PLAX chordal                  27.1  mm       23 - 38 LV fx shortening, PLAX chordal           33    %        >=29 LV PW thickness, ED                      12.4  mm       ---------- IVS/LV PW ratio, ED                      1.19           <=1.3 LV e&', lateral  5.77  cm/s     ---------- LV E/e&', lateral                         16.98          ---------- LV e&', medial                            4.13  cm/s     ---------- LV E/e&', medial                          23.73          ---------- LV e&', average                           4.95  cm/s     ---------- LV E/e&', average                         19.8           ----------  Ventricular septum                       Value          Reference IVS thickness, ED                        14.7  mm       ----------  LVOT                                     Value          Reference LVOT ID, S                               20    mm       ---------- LVOT area                                3.14  cm^2     ---------- LVOT peak velocity, S                    93.6  cm/s     ----------  Aortic valve                             Value          Reference Aortic valve peak velocity, S            111   cm/s     ---------- Aortic peak gradient, S                  5     mm Hg    ---------- Velocity ratio, peak, LVOT/AV            0.84           ---------- Aortic valve area, peak velocity         2.65  cm^2     ---------- Aortic valve area/bsa, peak  1.44  cm^2/m^2 ---------- velocity  Aorta                                    Value          Reference Aortic root ID, ED                       30    mm       ----------  Left atrium                              Value          Reference LA ID, A-P, ES                           34    mm       ---------- LA ID/bsa, A-P                           1.85  cm/m^2   <=2.2 LA area, ES, A4C                         17.51 cm^2     8.8 - 23.4 LA  volume, S                             51.1  ml       ---------- LA volume/bsa, S                         27.8  ml/m^2   ---------- LA volume, ES, 1-p A4C                   33.6  ml       ---------- LA volume/bsa, ES, 1-p A4C               18.3  ml/m^2   ---------- LA volume, ES, 1-p A2C                   73.1  ml       ---------- LA volume/bsa, ES, 1-p A2C               39.8  ml/m^2   ----------  Mitral valve                             Value          Reference Mitral E-wave peak velocity              98    cm/s     ---------- Mitral A-wave peak velocity              86.8  cm/s     ---------- Mitral deceleration time                 176   ms       150 - 230 Mitral peak gradient, D                  4     mm Hg    ---------- Mitral E/A ratio,  peak                   1.1            ----------  Right atrium                             Value          Reference RA ID, S-I, ES, A4C                      42.1  mm       34 - 49 RA area, ES, A4C                         11.8  cm^2     8.3 - 19.5 RA volume, ES, A/L                       27.2  ml       ---------- RA volume/bsa, ES, A/L                   14.8  ml/m^2   ----------  Right ventricle                          Value          Reference RV ID, ED, PLAX                          30.4  mm       19 - 38 TAPSE                                    29.3  mm       ---------- RV s&', lateral, S                        15.3  cm/s     ----------  Pulmonic valve                           Value          Reference Pulmonic valve peak velocity, S          63.5  cm/s     ----------  Legend: (L)  and  (H)  mark values outside specified reference range.  ------------------------------------------------------------------- Prepared and Electronically Authenticated by  Yvonne Kendall, MD 2018-06-05T11:59:18             EKG:  EKG is  ordered today.  The ekg ordered today is personally reviewed and demonstrates sinus rhythm with occasional APCs low  voltage.  Recent Labs: No results found for requested labs within last 365 days.  Recent Lipid Panel    Component Value Date/Time   CHOL 200 10/20/2016 1846   TRIG 94 10/20/2016 1846   HDL 50 10/20/2016 1846   CHOLHDL 4.0 10/20/2016 1846   VLDL 19 10/20/2016 1846   LDLCALC 131 (H) 10/20/2016 1846    Physical Exam:    VS:  BP 108/60 (BP Location: Right Arm, Patient Position: Sitting)   Pulse 74   Ht 5\' 3"  (1.6 m)   Wt 157 lb (71.2 kg)   SpO2  96%   BMI 27.81 kg/m     Wt Readings from Last 3 Encounters:  10/09/22 157 lb (71.2 kg)  09/25/21 159 lb 6.4 oz (72.3 kg)  09/13/21 158 lb 8.2 oz (71.9 kg)    Appears younger than her GEN:  Well nourished, well developed in no acute distress HEENT: Normal NECK: No JVD; No carotid bruits LYMPHATICS: No lymphadenopathy CARDIAC: RRR, no murmurs, rubs, gallops RESPIRATORY:  Clear to auscultation without rales, wheezing or rhonchi  ABDOMEN: Soft, non-tender, non-distended MUSCULOSKELETAL:  No edema; No deformity  SKIN: Warm and dry NEUROLOGIC:  Alert and oriented x 3 PSYCHIATRIC:  Normal affect     Signed, Norman Herrlich, MD  10/09/2022 2:21 PM    [facility]

## 2022-10-09 ENCOUNTER — Ambulatory Visit: Payer: Medicare PPO | Attending: Cardiology | Admitting: Cardiology

## 2022-10-09 ENCOUNTER — Encounter: Payer: Self-pay | Admitting: Cardiology

## 2022-10-09 VITALS — BP 108/60 | HR 74 | Ht 63.0 in | Wt 157.0 lb

## 2022-10-09 DIAGNOSIS — I10 Essential (primary) hypertension: Secondary | ICD-10-CM

## 2022-10-09 DIAGNOSIS — R5381 Other malaise: Secondary | ICD-10-CM | POA: Diagnosis not present

## 2022-10-09 DIAGNOSIS — I471 Supraventricular tachycardia, unspecified: Secondary | ICD-10-CM | POA: Diagnosis not present

## 2022-10-09 DIAGNOSIS — N183 Chronic kidney disease, stage 3 unspecified: Secondary | ICD-10-CM | POA: Diagnosis not present

## 2022-10-09 DIAGNOSIS — R5383 Other fatigue: Secondary | ICD-10-CM

## 2022-10-09 MED ORDER — METOPROLOL SUCCINATE ER 25 MG PO TB24: 25.0000 mg | ORAL_TABLET | Freq: Every day | ORAL | 3 refills | Status: DC

## 2022-10-09 NOTE — Patient Instructions (Addendum)
Medication Instructions:  Your physician has recommended you make the following change in your medication:   STOP: Nadolol START: Toprol XL 25 mg daily  *If you need a refill on your cardiac medications before your next appointment, please call your pharmacy*   Lab Work: None If you have labs (blood work) drawn today and your tests are completely normal, you will receive your results only by: MyChart Message (if you have MyChart) OR A paper copy in the mail If you have any lab test that is abnormal or we need to change your treatment, we will call you to review the results.   Testing/Procedures: None   Follow-Up: At [facility], you and your health needs are our priority.  As part of our continuing mission to provide you with exceptional heart care, we have created designated Provider Care Teams.  These Care Teams include your primary Cardiologist (physician) and Advanced Practice Providers (APPs -  Physician Assistants and Nurse Practitioners) who all work together to provide you with the care you need, when you need it.  We recommend signing up for the patient portal called "MyChart".  Sign up information is provided on this After Visit Summary.  MyChart is used to connect with patients for Virtual Visits (Telemedicine).  Patients are able to view lab/test results, encounter notes, upcoming appointments, etc.  Non-urgent messages can be sent to your provider as well.   To learn more about what you can do with MyChart, go to [email or web address].    Your next appointment:   1 year(s)  Provider:   Norman Herrlich, MD    Other Instructions None    1. Avoid all over-the-counter antihistamines except Claritin/Loratadine and Zyrtec/Cetrizine. 2. Avoid all combination including cold sinus allergies flu decongestant and sleep medications 3. You can use Robitussin DM Mucinex and Mucinex DM for cough. 4. can use Tylenol aspirin ibuprofen and naproxen but no combinations  such as sleep or sinus.

## 2023-04-07 DIAGNOSIS — S32599A Other specified fracture of unspecified pubis, initial encounter for closed fracture: Secondary | ICD-10-CM

## 2023-04-07 HISTORY — DX: Other specified fracture of unspecified pubis, initial encounter for closed fracture: S32.599A

## 2023-05-06 DIAGNOSIS — C4492 Squamous cell carcinoma of skin, unspecified: Secondary | ICD-10-CM | POA: Insufficient documentation

## 2023-05-06 HISTORY — DX: Squamous cell carcinoma of skin, unspecified: C44.92

## 2023-05-07 ENCOUNTER — Telehealth: Payer: Self-pay | Admitting: *Deleted

## 2023-05-07 NOTE — Telephone Encounter (Signed)
   Name: Chelsea Mcintyre  DOB: 10-Jun-1932  MRN: [id number]  Primary Cardiologist: None   Preoperative team, please contact this patient and set up a phone call appointment for further preoperative risk assessment. Please obtain consent and complete medication review. Thank you for your help.  I confirm that guidance regarding antiplatelet and oral anticoagulation therapy has been completed and, if necessary, noted below.  Patient has a history of CVA and Plavix is not managed by cardiology.  Guidance for holding Plavix will need to come from prescribing MD.  I also confirmed the patient resides in the state of [place]. As per [facility] [employer] telemedicine laws, the patient must reside in the state in which the provider is licensed.   Napoleon Form, Leodis Rains, NP 05/07/2023, 9:27 AM [facility]

## 2023-05-07 NOTE — Telephone Encounter (Signed)
1st attempt to reach pt regarding surgical clearance and the need for a tele visit.  Voicemail box is full and could not leave a message.

## 2023-05-07 NOTE — Telephone Encounter (Signed)
   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

## 2023-05-11 ENCOUNTER — Telehealth: Payer: Self-pay | Admitting: *Deleted

## 2023-05-11 NOTE — Telephone Encounter (Signed)
S/w pt is set up for pre op tele appt. Med rec and consent done. Will remove from pre op pool.    Patient Consent for Virtual Visit         [name] has provided verbal consent on 05/11/2023 for a virtual visit (video or telephone).   CONSENT FOR VIRTUAL VISIT FOR:  Chelsea Mcintyre  By participating in this virtual visit I agree to the following:  I hereby voluntarily request, consent and authorize [facility] and its employed or contracted physicians, physician assistants, nurse practitioners or other licensed health care professionals (the Practitioner), to provide me with telemedicine health care services (the "Services") as deemed necessary by the treating Practitioner. I acknowledge and consent to receive the Services by the Practitioner via telemedicine. I understand that the telemedicine visit will involve communicating with the Practitioner through live audiovisual communication technology and the disclosure of certain medical information by electronic transmission. I acknowledge that I have been given the opportunity to request an in-person assessment or other available alternative prior to the telemedicine visit and am voluntarily participating in the telemedicine visit.  I understand that I have the right to withhold or withdraw my consent to the use of telemedicine in the course of my care at any time, without affecting my right to future care or treatment, and that the Practitioner or I may terminate the telemedicine visit at any time. I understand that I have the right to inspect all information obtained and/or recorded in the course of the telemedicine visit and may receive copies of available information for a reasonable fee.  I understand that some of the potential risks of receiving the Services via telemedicine include:  Delay or interruption in medical evaluation due to technological equipment failure or disruption; Information transmitted may not be sufficient  (e.g. poor resolution of images) to allow for appropriate medical decision making by the Practitioner; and/or  In rare instances, security protocols could fail, causing a breach of personal health information.  Furthermore, I acknowledge that it is my responsibility to provide information about my medical history, conditions and care that is complete and accurate to the best of my ability. I acknowledge that Practitioner's advice, recommendations, and/or decision may be based on factors not within their control, such as incomplete or inaccurate data provided by me or distortions of diagnostic images or specimens that may result from electronic transmissions. I understand that the practice of medicine is not an exact science and that Practitioner makes no warranties or guarantees regarding treatment outcomes. I acknowledge that a copy of this consent can be made available to me via my patient portal [facility] MyChart), or I can request a printed copy by calling the office of  [facility].    I understand that my insurance will be billed for this visit.   I have read or had this consent read to me. I understand the contents of this consent, which adequately explains the benefits and risks of the Services being provided via telemedicine.  I have been provided ample opportunity to ask questions regarding this consent and the Services and have had my questions answered to my satisfaction. I give my informed consent for the services to be provided through the use of telemedicine in my medical care

## 2023-05-26 ENCOUNTER — Encounter: Payer: Self-pay | Admitting: Nurse Practitioner

## 2023-05-26 ENCOUNTER — Ambulatory Visit: Payer: Medicare PPO | Attending: Nurse Practitioner | Admitting: Nurse Practitioner

## 2023-05-26 DIAGNOSIS — Z01810 Encounter for preprocedural cardiovascular examination: Secondary | ICD-10-CM

## 2023-05-26 NOTE — Progress Notes (Signed)
 Virtual Visit via Telephone Note   Because of Chelsea Mcintyre's co-morbid illnesses, she is at least at moderate risk for complications without adequate follow up.  This format is felt to be most appropriate for this patient at this time.  The patient did not have access to video technology/had technical difficulties with video requiring transitioning to audio format only (telephone).  All issues noted in this document were discussed and addressed.  No physical exam could be performed with this format.  Please refer to the patient's chart for her consent to telehealth for [facility].  Evaluation Performed:  Preoperative cardiovascular risk assessment _____________   Date:  05/26/2023   Patient ID:  Chelsea, Mcintyre 1933-04-21, MRN [id number] Patient Location:  Home Provider location:   Office  Primary Care Provider:  Thurmond Cathlyn LABOR., MD Primary Cardiologist:  Redell Leiter, MD  Chief Complaint / Patient Profile   89 y.o. 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  History of Present Illness    CHRISTEENA Mcintyre is a 89 y.o. female who presents via audio/video conferencing for a telehealth visit today.  Pt was last seen in cardiology clinic on 10/09/2022 by Dr. Leiter.  At that time LUVADA SALAMONE was doing well.  The patient is now pending procedure as outlined above. Since her last visit, she denies chest pain, shortness of breath, lower extremity edema, fatigue, palpitations, melena, hematuria, hemoptysis, diaphoresis, weakness, presyncope, syncope, orthopnea, and PND. She remains active with yard and house work. Her children have insisted that she decrease the intensity of her work recently. She is able to achieve > 4 METS activity without concerning cardiac symptoms.   Past  Medical History    Past Medical History:  Diagnosis Date   Arthritis    bilateral hips and bilateral shoulders.   Cancer (HCC)    skin back, nose   Complication of anesthesia    heart stopped with the anesthesia x1,  difficult to wake up   Dysrhythmia    if taking certain medications   Family history of adverse reaction to anesthesia    sister slow to awaken- almost died last time   Frequency of urination    Hypertension    Jaundice    as a child   MVP (mitral valve prolapse)    Pneumonia 2009   hx   Porphyria variegata (HCC)    family history of pt thinks she might have as well   Recurrent upper respiratory infection (URI)    Dx 1st of september with sinus infection, placed on antibiotics at that time   Stroke [facility])    2018   Past Surgical History:  Procedure Laterality Date   ABDOMINAL HYSTERECTOMY  75   partial   APPENDECTOMY     BACK SURGERY  2012   kyphoplasty   BREAST LUMPECTOMY WITH RADIOACTIVE SEED LOCALIZATION Right 09/13/2021   Procedure: RIGHT BREAST LUMPECTOMY WITH RADIOACTIVE SEED LOCALIZATION;  Surgeon: Aron Shoulders, MD;  Location:  SURGERY CENTER;  Service: General;  Laterality: Right;   BREAST SURGERY     1982,70rt   CARDIAC CATHETERIZATION     1994, detected MVP   ELBOW FRACTURE SURGERY Left 89,   hardware removed later in year   EYE SURGERY     bilateral cataract surgery 2007 with lens implant  fibroid cyst     ovary right removed 1966   fibroid tumor     1970, right breast removed   FRACTURE SURGERY     1988 shattered elbow left   HARDWARE REMOVAL     left elbow 1998   KYPHOPLASTY Bilateral 04/11/2019   Procedure: KYPHOPLASTY BILATERAL, THORACIC SEVEN- THORACIC EIGHT;  Surgeon: Mavis Purchase, MD;  Location: [facility] OR;  Service: Neurosurgery;  Laterality: Bilateral;  KYPHOPLASTY BILATERAL, THORACIC 7- THORACIC 8   MASS EXCISION Left 09/13/2021   Procedure: EXCISION OF LEFT AXILLARY MASS;  Surgeon: Aron Shoulders, MD;  Location: [facility]  [place];  Service: General;  Laterality: Left;   REVERSE SHOULDER ARTHROPLASTY Right 10/28/2018   Procedure: REVERSE SHOULDER ARTHROPLASTY;  Surgeon: Melita Drivers, MD;  Location: [facility];  Service: Orthopedics;  Laterality: Right;    TONSILLECTOMY  44   and adenoids   TOTAL HIP ARTHROPLASTY Left 05/15/2015   Procedure: LEFT TOTAL HIP ARTHROPLASTY ANTERIOR APPROACH;  Surgeon: Redell Shoals, MD;  Location: [facility] OR;  Service: Orthopedics;  Laterality: Left;    Allergies  Allergies  Allergen Reactions   Hrt Base [Hormone Cream Base] Shortness Of Breath    All hormone replacement medications    Sulfa Drugs Cross Reactors     Other Reaction(s): angioedema   Gluten Meal Nausea Only    Bothers her stomach    Other Other (See Comments) and Nausea Only    Gluten: bothers her stomach   Tape Other (See Comments)    Layer of skin peels off and area becomes raw   Ace Inhibitors Cough   Latex Hives   Lisinopril Cough   Nsaids    Statins     Other reaction(s): Myalgias (intolerance)   Sulphasomidine    Amlodipine Rash   Barbiturates Rash    Home Medications    Prior to Admission medications   Medication Sig Start Date End Date Taking? Authorizing Provider  amoxicillin (AMOXIL) 500 MG capsule Take 2,000 mg by mouth See admin instructions. Take 4 capsules (2000 mg) by mouth 1 hour prior to dental work    [provider]  ASCORBIC ACID PO Take 1 tablet by mouth daily.    [provider]  calcium  carbonate (OS-CAL - DOSED IN MG OF ELEMENTAL CALCIUM ) 1250 (500 Ca) MG tablet calcium  carbonate 500 mg calcium  (1,250 mg) tablet   1  by oral route.    [provider]  Cholecalciferol  25 MCG (1000 UT) tablet cholecalciferol  (vitamin D3) 25 mcg (1,000 unit) tablet   1000 units by oral route.    [provider]  clopidogrel  (PLAVIX ) 75 MG tablet Take 75 mg by mouth daily.    [provider]  diclofenac Sodium (VOLTAREN) 1 % GEL Apply  topically. 07/16/21   [provider]  ezetimibe  (ZETIA ) 10 MG tablet Take 10 mg by mouth daily.    [provider]  hydrochlorothiazide  (MICROZIDE ) 12.5 MG capsule Take 1 capsule (12.5 mg total) by mouth daily. 10/22/16   Sudini, Srikar, MD  losartan  (COZAAR ) 25 MG tablet Take 12.5 mg by mouth 2 (two) times daily.  09/28/16   [provider]  metoprolol  succinate (TOPROL  XL) 25 MG 24 hr tablet Take 1 tablet (25 mg total) by mouth daily. 10/09/22   Monetta Redell PARAS, MD  Omega 3-6-9 Fatty Acids (TRIPLE OMEGA-3-6-9) CAPS Take 1 capsule by mouth daily.    [provider]  Turmeric, Curcuma Longa, (TURMERIC ROOT) POWD Take by mouth. 10/22/16  [provider]  Vitamin A 2400 MCG (8000 UT) CAPS Take by mouth. 10/22/16   [provider]    Physical Exam    Vital Signs:  VICTORIANA AZIZ does not have vital signs available for review today.  Given telephonic nature of communication, physical exam is limited. AAOx3. NAD. Normal affect.  Speech and respirations are unlabored.  Accessory Clinical Findings    None  Assessment & Plan    1.  Preoperative Cardiovascular Risk Assessment: According to the Revised Cardiac Risk Index (RCRI), her Perioperative Risk of Major Cardiac Event is (%): 0.9. Her Functional Capacity in METs is: 4.95 according to the Duke Activity Status Index (DASI). The patient is doing well from a cardiac perspective. Therefore, based on ACC/AHA guidelines, the patient would be at acceptable risk for the planned procedure without further cardiovascular testing.   The patient was advised that if she develops new symptoms prior to surgery to contact our office to arrange for a follow-up visit, and she verbalized understanding.  Patient has a history of CVA and Plavix  is not managed by cardiology. Guidance for holding Plavix  will need to come from prescribing MD.   A copy of this note will be routed to requesting surgeon.  Time:   Today,  I have spent 10 minutes with the patient with telehealth technology discussing medical history, symptoms, and management plan.     Rosaline EMERSON Bane, NP-C  05/26/2023, 2:21 PM [address] Office [phone] Fax [phone]

## 2023-09-12 ENCOUNTER — Other Ambulatory Visit: Payer: Self-pay | Admitting: Cardiology

## 2023-09-17 ENCOUNTER — Telehealth: Payer: Self-pay | Admitting: Cardiology

## 2023-09-17 MED ORDER — METOPROLOL SUCCINATE ER 25 MG PO TB24: 25.0000 mg | ORAL_TABLET | Freq: Every day | ORAL | 0 refills | Status: DC

## 2023-09-17 NOTE — Telephone Encounter (Signed)
*  STAT* If patient is at the pharmacy, call can be transferred to refill team.   1. Which medications need to be refilled? (please list name of each medication and dose if known) metoprolol  succinate (TOPROL -XL) 25 MG 24 hr tablet   2. Which pharmacy/location (including street and city if local pharmacy) is medication to be sent to? [facility] [phone] - [place], [place] - [address] AT [facility] [address] Phone: [phone]  Fax: [phone]      3. Do they need a 30 day or 90 day supply? Pt is upset because she went to pick up her prescription yesterday and it was for a 30 day supply. She said she had an appt for 10-05-23 but we cx the appt because Dr Sandee Crook was out of town and she was r/s to 11/18/23. She doesn't understand why they would not refill for 90 days til her appt since it was not her that moved the appt.

## 2023-09-17 NOTE — Telephone Encounter (Signed)
 Left VM that RX has been sent for 90 day refill.

## 2023-10-05 ENCOUNTER — Ambulatory Visit: Admitting: Cardiology

## 2023-10-13 DIAGNOSIS — I371 Nonrheumatic pulmonary valve insufficiency: Secondary | ICD-10-CM

## 2023-10-13 DIAGNOSIS — I10 Essential (primary) hypertension: Secondary | ICD-10-CM

## 2023-10-13 DIAGNOSIS — I340 Nonrheumatic mitral (valve) insufficiency: Secondary | ICD-10-CM | POA: Diagnosis not present

## 2023-10-13 DIAGNOSIS — R55 Syncope and collapse: Secondary | ICD-10-CM

## 2023-10-13 DIAGNOSIS — I361 Nonrheumatic tricuspid (valve) insufficiency: Secondary | ICD-10-CM

## 2023-10-13 DIAGNOSIS — E785 Hyperlipidemia, unspecified: Secondary | ICD-10-CM

## 2023-10-14 ENCOUNTER — Encounter: Payer: Self-pay | Admitting: Cardiology

## 2023-10-14 DIAGNOSIS — R55 Syncope and collapse: Secondary | ICD-10-CM | POA: Diagnosis not present

## 2023-10-14 DIAGNOSIS — I10 Essential (primary) hypertension: Secondary | ICD-10-CM | POA: Diagnosis not present

## 2023-10-14 LAB — LAB REPORT - SCANNED: EGFR: 83

## 2023-10-14 NOTE — Telephone Encounter (Signed)
 Spoke with pt who states that her dc summary from [facility] 10/14/23 advised her to stop her hydrochlorothiazide  12.5 mg as it might have been the reason for her syncopal episode due to dehydration. Pt states that her BP has been 140/70's HR 70's. Please advise

## 2023-10-14 NOTE — Telephone Encounter (Signed)
 Pt c/o medication issue:  1. Name of Medication: hydrochlorothiazide  (MICROZIDE ) 12.5 MG capsule   2. How are you currently taking this medication (dosage and times per day)? N/A  3. Are you having a reaction (difficulty breathing--STAT)? No  4. What is your medication issue? Pt was told to stop taking this medication at the hospital and would like to know if she should start back or hold off until her upcoming appt. Please advise

## 2023-10-16 NOTE — Telephone Encounter (Signed)
 error

## 2023-10-22 NOTE — Progress Notes (Signed)
 " Cardiology Office Note   Date:  10/23/2023  ID:  Chelsea Mcintyre, Chelsea Mcintyre November 10, 1932, MRN [id number] PCP: Thurmond Cathlyn LABOR., MD  [facility] Providers Cardiologist:  Redell Leiter, MD     History of Present Illness Chelsea Mcintyre is a 89 y.o. female with a past medical history of strokes, PACs, SVT, breast cancer, dyslipidemia, hypertension.  10/13/2023 echo EF 55 to 60%, mild concentric LVH, mild MR, mild aortic sclerosis without stenosis, mild pulmonic valve regurgitation, mild TR 10/06/2022 monitor average heart rate 60 bpm, predominant rhythm was sinus, 111 episodes of SVT, SVE's frequent 14.6% 10/21/2016 echo bubble study EF 65 to 70%, grade 2 DD no evidence of interatrial shunt 10/20/2016 carotid duplex minor carotid atherosclerosis not hemodynamically significant, less than 50% bilaterally  Most recently evaluated by Dr. Leiter in May 2024, an event monitor had been arranged by her PCP revealing frequent episodes of SVT and she was transition to Toprol  XL to help avoid malaise and fatigue.  She was admitted [facility] on 10/13/2023 to 10/14/2023 for syncopal event that occurred during the night after she had been using the restroom.  This apparently had happened in October 2024 as well as she sustained a pelvic fracture at that time.  Lab work was unremarkable, PACs noted on telemetry but no atrial fibrillation or arrhythmia, she also had an echocardiogram which listed in the report her rhythm was consistent with atrial fibrillation however covering cardiologist reviewed that that was not the case.  Her HCTZ and potassium were discontinued, felt this could be contributing to her dehydration that may have exacerbated syncopal event.  She presents today for follow up after recent admission for syncope. Her first episode of syncope was in October 2024, she was watering her grass outside, went to turn the water  off and just passed out.  She states she had no prodromal warning.  Her most recent  episode occurred as she was getting up to void in the middle of the night, she said over the last few months she has had a drastic change in her voiding pattern, increasing with frequency and volume.  That particular evening she got up to go to the bathroom, passed out for unclear duration, woke up went to sleep and the next morning she went to get checked out with concerns of rib pain following her fall.  She is evaluated by her PCP yesterday, they did obtain lab work, also a UA.  Recent A1c was 5.4% in January of this year and her random glucose yesterday was 85. Her daughter, who is a physician, suggested to her it could be diabetes insipidus, I advised her to follow back up with her PCP regarding further testing. She denies chest pain, palpitations, dyspnea, pnd, orthopnea, n, v, edema, weight gain, or early satiety.   ROS: Review of Systems  Genitourinary:  Positive for frequency and urgency.  Neurological:  Positive for dizziness.  All other systems reviewed and are negative.    Studies Reviewed EKG Interpretation Date/Time:  Friday October 23 2023 11:03:18 EDT Ventricular Rate:  76 PR Interval:  142 QRS Duration:  70 QT Interval:  380 QTC Calculation: 427 R Axis:   14  Text Interpretation: Normal sinus rhythm Low voltage QRS (cited on or before 12-Sep-2021) When compared with ECG of 12-Sep-2021 09:32, Premature ventricular complexes are no longer Present Confirmed by Carlin Nest [phone]) on 10/23/2023 11:06:02 AM    Cardiac Studies & Procedures   ______________________________________________________________________________________________     ECHOCARDIOGRAM  ECHOCARDIOGRAM COMPLETE BUBBLE STUDY 10/21/2016  Narrative *[facility]* [address] [place], [place] [address] [phone]  ------------------------------------------------------------------- Transthoracic Echocardiography  Patient:    Lashawnda, Hancox MR #:       [id number] Study Date:  10/21/2016 Gender:     F Age:        53 Height:     152.4 cm Weight:     76.9 kg BSA:        1.84 m^2 Pt. Status: Room:  SONOGRAPHER  [facility] ATTENDING    Sherial Bail TISA Sherial, Radhika REFERRING    [place], [place] PERFORMING   [facility]  cc:  ------------------------------------------------------------------- LV EF: 65% -   70%  ------------------------------------------------------------------- Indications:      TIA 435.9.  ------------------------------------------------------------------- History:   PMH:  Dysrhythmia,cancer, mitral valve prolapse, pneumonia.  Risk factors:  Hypertension.  ------------------------------------------------------------------- Study Conclusions  - Left ventricle: The cavity size was normal. Wall thickness was increased in a pattern of mild LVH. There was focal basal hypertrophy. Systolic function was vigorous. The estimated ejection fraction was in the range of 65% to 70%. Features are consistent with a pseudonormal left ventricular filling pattern, with concomitant abnormal relaxation and increased filling pressure (grade 2 diastolic dysfunction). Doppler parameters are consistent with high ventricular filling pressure. - Mitral valve: Calcified annulus. Mildly thickened leaflets . - Right ventricle: The cavity size was normal. Wall thickness was normal. Systolic function was normal. - Atrial septum: Doppler and agitated saline contrast study showed no atrial level shunt, in the baseline state. - Pericardium, extracardiac: A trivial pericardial effusion was identified posterior to the heart.  ------------------------------------------------------------------- Study data:   Study status:  Routine.  Procedure:  The patient reported no pain pre or post test. Transthoracic echocardiography. Image quality was adequate. Intravenous contrast (agitated saline) was administered.          Transthoracic  echocardiography.  M-mode, complete 2D, spectral Doppler, and color Doppler.  Birthdate: Patient birthdate: 10-May-1933.  Age:  Patient is 89 yr old.  Sex: Gender: female.    BMI: 33.1 kg/m^2.  Blood pressure:     146/58 Patient status:  Inpatient.  Study date:  Study date: 10/21/2016. Study time: 08:31 AM.  Location:  Echo laboratory.  -------------------------------------------------------------------  ------------------------------------------------------------------- Left ventricle:  The cavity size was normal. Wall thickness was increased in a pattern of mild LVH. There was focal basal hypertrophy. Systolic function was vigorous. The estimated ejection fraction was in the range of 65% to 70%. Images were inadequate for LV wall motion assessment. Features are consistent with a pseudonormal left ventricular filling pattern, with concomitant abnormal relaxation and increased filling pressure (grade 2 diastolic dysfunction). Doppler parameters are consistent with high ventricular filling pressure.  ------------------------------------------------------------------- Aortic valve:   Structurally normal valve.   Cusp separation was normal.  Doppler:  Transvalvular velocity was within the normal range. There was no stenosis. There was no regurgitation.    Peak velocity ratio of LVOT to aortic valve: 0.84. Valve area (Vmax): 2.65 cm^2. Indexed valve area (Vmax): 1.44 cm^2/m^2.    Peak gradient (S): 5 mm Hg.  ------------------------------------------------------------------- Aorta:  Aortic root: The aortic root was normal in size. Ascending aorta: The ascending aorta was normal in size.  ------------------------------------------------------------------- Mitral valve:   Calcified annulus. Mildly thickened leaflets . Leaflet separation was normal.  Doppler:  Transvalvular velocity was within the normal range. There was no evidence for stenosis. There was trivial regurgitation.    Peak  gradient (D): 4 mm Hg.  ------------------------------------------------------------------- Atrial septum:   Doppler and agitated saline contrast study showed no atrial level shunt, in the baseline state.  ------------------------------------------------------------------- Right ventricle:  The cavity size was normal. Wall thickness was normal. Systolic function was normal.  ------------------------------------------------------------------- Pulmonic valve:    Structurally normal valve.   Cusp separation was normal.  Doppler:  Transvalvular velocity was within the normal range. There was mild regurgitation.  ------------------------------------------------------------------- Tricuspid valve:   Structurally normal valve.   Leaflet separation was normal.  Doppler:  Transvalvular velocity was within the normal range. There was trivial regurgitation.  ------------------------------------------------------------------- Pulmonary artery:   The main pulmonary artery was normal-sized. Systolic pressure could not be accurately estimated.  ------------------------------------------------------------------- Right atrium:  The atrium was normal in size.  ------------------------------------------------------------------- Pericardium:  A trivial pericardial effusion was identified posterior to the heart.  ------------------------------------------------------------------- Systemic veins: Inferior vena cava: The vessel was dilated. The respirophasic diameter changes were blunted (< 50%), consistent with elevated central venous pressure.  ------------------------------------------------------------------- Measurements  Left ventricle                           Value          Reference LV ID, ED, PLAX chordal          (L)     40.7  mm       43 - 52 LV ID, ES, PLAX chordal                  27.1  mm       23 - 38 LV fx shortening, PLAX chordal           33    %        >=29 LV PW thickness,  ED                      12.4  mm       ---------- IVS/LV PW ratio, ED                      1.19           <=1.3 LV e&', lateral                           5.77  cm/s     ---------- LV E/e&', lateral                         16.98          ---------- LV e&', medial                            4.13  cm/s     ---------- LV E/e&', medial                          23.73          ---------- LV e&', average                           4.95  cm/s     ---------- LV E/e&', average                         19.8           ----------  Ventricular septum                       Value          Reference IVS thickness, ED                        14.7  mm       ----------  LVOT                                     Value          Reference LVOT ID, S                               20    mm       ---------- LVOT area                                3.14  cm^2     ---------- LVOT peak velocity, S                    93.6  cm/s     ----------  Aortic valve                             Value          Reference Aortic valve peak velocity, S            111   cm/s     ---------- Aortic peak gradient, S                  5     mm Hg    ---------- Velocity ratio, peak, LVOT/AV            0.84           ---------- Aortic valve area, peak velocity         2.65  cm^2     ---------- Aortic valve area/bsa, peak              1.44  cm^2/m^2 ---------- velocity  Aorta                                    Value          Reference Aortic root ID, ED                       30    mm       ----------  Left atrium                              Value          Reference LA ID, A-P, ES                           34    mm       ---------- LA ID/bsa, A-P  1.85  cm/m^2   <=2.2 LA area, ES, A4C                         17.51 cm^2     8.8 - 23.4 LA volume, S                             51.1  ml       ---------- LA volume/bsa, S                         27.8  ml/m^2   ---------- LA volume, ES, 1-p A4C                   33.6  ml        ---------- LA volume/bsa, ES, 1-p A4C               18.3  ml/m^2   ---------- LA volume, ES, 1-p A2C                   73.1  ml       ---------- LA volume/bsa, ES, 1-p A2C               39.8  ml/m^2   ----------  Mitral valve                             Value          Reference Mitral E-wave peak velocity              98    cm/s     ---------- Mitral A-wave peak velocity              86.8  cm/s     ---------- Mitral deceleration time                 176   ms       150 - 230 Mitral peak gradient, D                  4     mm Hg    ---------- Mitral E/A ratio, peak                   1.1            ----------  Right atrium                             Value          Reference RA ID, S-I, ES, A4C                      42.1  mm       34 - 49 RA area, ES, A4C                         11.8  cm^2     8.3 - 19.5 RA volume, ES, A/L                       27.2  ml       ---------- RA volume/bsa, ES, A/L  14.8  ml/m^2   ----------  Right ventricle                          Value          Reference RV ID, ED, PLAX                          30.4  mm       19 - 38 TAPSE                                    29.3  mm       ---------- RV s&', lateral, S                        15.3  cm/s     ----------  Pulmonic valve                           Value          Reference Pulmonic valve peak velocity, S          63.5  cm/s     ----------  Legend: (L)  and  (H)  mark values outside specified reference range.  ------------------------------------------------------------------- Prepared and Electronically Authenticated by  Lonni Hanson, MD 2018-06-05T11:59:18          ______________________________________________________________________________________________      Risk Assessment/Calculations           Physical Exam VS:  BP 124/82   Pulse 76   Ht 5' 3 (1.6 m)   Wt 149 lb (67.6 kg)   SpO2 96%   BMI 26.39 kg/m    Wt Readings from Last 3 Encounters:  10/23/23 149 lb (67.6  kg)  10/09/22 157 lb (71.2 kg)  09/25/21 159 lb 6.4 oz (72.3 kg)    GEN: Well nourished, well developed in no acute distress NECK: No JVD; No carotid bruits CARDIAC: RRR, no murmurs, rubs, gallops RESPIRATORY:  Clear to auscultation without rales, wheezing or rhonchi  ABDOMEN: Soft, non-tender, non-distended EXTREMITIES:  No edema; No deformity   ASSESSMENT AND PLAN Syncope and collapse -we talked about contributory causes for syncope and collapse, she does have a history of frequent PACs and SVT however she is typically very sensitive to this and does not recall any palpitations leading up to her syncope.  It does sound like it could be more related to volume depletion.  Her HCTZ has been held and I think that is appropriate.  We did discuss possible loop recorder implantation, as well as repeating a live monitor, she does not want to proceed with either at this time.  Excessive urination-this has been going on for a few months, increasing with frequency, volume, she states her urine is very clear.  She was evaluated by her PCP yesterday and they obtained a UA however she questions if she could possibly have diabetes insipidus.  I did discuss that she needs to discuss this further with her PCP.  PACs/SVT-currently quiescent, she questions whether she can change how she has been taking her metoprolol  and try half tablet twice daily which is reasonable, change metoprolol  succinate to 12.5 mg twice daily.  Hypertension-blood pressure is well-controlled 124/82, she has been keeping her own log at home and overall and feels to be  well-controlled for her age, she does have occasional reading that is elevated however I think we need to allow for permissive hypertension to avoid further syncopal episodes in the future.  Continue losartan  25 mg twice daily.  History of stroke-currently on Plavix .        Dispo: Change metoprolol  succinate to 12.5 mg twice daily, keep follow-up in 1  month.  Signed, Delon JAYSON Hoover, NP  "

## 2023-10-23 ENCOUNTER — Encounter: Payer: Self-pay | Admitting: Cardiology

## 2023-10-23 ENCOUNTER — Ambulatory Visit: Attending: Cardiology | Admitting: Cardiology

## 2023-10-23 VITALS — BP 124/82 | HR 76 | Ht 63.0 in | Wt 149.0 lb

## 2023-10-23 DIAGNOSIS — I639 Cerebral infarction, unspecified: Secondary | ICD-10-CM

## 2023-10-23 DIAGNOSIS — I10 Essential (primary) hypertension: Secondary | ICD-10-CM

## 2023-10-23 DIAGNOSIS — R002 Palpitations: Secondary | ICD-10-CM

## 2023-10-23 DIAGNOSIS — I471 Supraventricular tachycardia, unspecified: Secondary | ICD-10-CM

## 2023-10-23 DIAGNOSIS — R55 Syncope and collapse: Secondary | ICD-10-CM | POA: Diagnosis not present

## 2023-10-23 MED ORDER — METOPROLOL SUCCINATE ER 25 MG PO TB24: 12.5000 mg | ORAL_TABLET | Freq: Two times a day (BID) | ORAL | 3 refills | Status: DC

## 2023-10-23 NOTE — Patient Instructions (Signed)
 Medication Instructions:  Your physician has recommended you make the following change in your medication:  Change Metoprolol  Succinate to 12.5 mg two times daily  *If you need a refill on your cardiac medications before your next appointment, please call your pharmacy*  Lab Work: NONE If you have labs (blood work) drawn today and your tests are completely normal, you will receive your results only by: MyChart Message (if you have MyChart) OR A paper copy in the mail If you have any lab test that is abnormal or we need to change your treatment, we will call you to review the results.  Testing/Procedures: NONE  Follow-Up: At [facility], you and your health needs are our priority.  As part of our continuing mission to provide you with exceptional heart care, our providers are all part of one team.  This team includes your primary Cardiologist (physician) and Advanced Practice Providers or APPs (Physician Assistants and Nurse Practitioners) who all work together to provide you with the care you need, when you need it.  Your next appointment:  Keep appt on July 2nd at 2:20    Provider:   Bertha Broad, MD    We recommend signing up for the patient portal called "MyChart".  Sign up information is provided on this After Visit Summary.  MyChart is used to connect with patients for Virtual Visits (Telemedicine).  Patients are able to view lab/test results, encounter notes, upcoming appointments, etc.  Non-urgent messages can be sent to your provider as well.   To learn more about what you can do with MyChart, go to [email or web address].   Other Instructions

## 2023-11-18 ENCOUNTER — Encounter: Payer: Self-pay | Admitting: Internal Medicine

## 2023-11-18 ENCOUNTER — Ambulatory Visit

## 2023-11-18 ENCOUNTER — Ambulatory Visit: Admitting: Cardiology

## 2023-11-18 VITALS — BP 140/78 | HR 69 | Ht 63.0 in | Wt 150.0 lb

## 2023-11-18 DIAGNOSIS — E782 Mixed hyperlipidemia: Secondary | ICD-10-CM | POA: Diagnosis not present

## 2023-11-18 DIAGNOSIS — G459 Transient cerebral ischemic attack, unspecified: Secondary | ICD-10-CM

## 2023-11-18 DIAGNOSIS — I10 Essential (primary) hypertension: Secondary | ICD-10-CM | POA: Diagnosis not present

## 2023-11-18 MED ORDER — METOPROLOL SUCCINATE ER 25 MG PO TB24: ORAL_TABLET | ORAL | 3 refills | Status: AC

## 2023-11-18 MED ORDER — CLOPIDOGREL BISULFATE 75 MG PO TABS: 75.0000 mg | ORAL_TABLET | Freq: Every day | ORAL | 3 refills | Status: AC

## 2023-11-18 NOTE — Assessment & Plan Note (Signed)
 Plan history of CVA. Advised her to continue with her Plavix  at full-strength 75 mg once daily. Continue lipid-lowering therapy with Zetia  10 mg once daily.

## 2023-11-18 NOTE — Patient Instructions (Signed)
 Medication Instructions:  Your physician has recommended you make the following change in your medication:   Increase Metoprolol  Succinate to 25 mg (1 tablet) in the am and 12.5 mg (0.5 tablet) in the pm.  Take Clopidgrel 75 mg (1 tablet) daily  *If you need a refill on your cardiac medications before your next appointment, please call your pharmacy*   Lab Work: None ordered If you have labs (blood work) drawn today and your tests are completely normal, you will receive your results only by: MyChart Message (if you have MyChart) OR A paper copy in the mail If you have any lab test that is abnormal or we need to change your treatment, we will call you to review the results.   Testing/Procedures: None ordered   Follow-Up: At [facility], you and your health needs are our priority.  As part of our continuing mission to provide you with exceptional heart care, we have created designated Provider Care Teams.  These Care Teams include your primary Cardiologist (physician) and Advanced Practice Providers (APPs -  Physician Assistants and Nurse Practitioners) who all work together to provide you with the care you need, when you need it.  We recommend signing up for the patient portal called MyChart.  Sign up information is provided on this After Visit Summary.  MyChart is used to connect with patients for Virtual Visits (Telemedicine).  Patients are able to view lab/test results, encounter notes, upcoming appointments, etc.  Non-urgent messages can be sent to your provider as well.   To learn more about what you can do with MyChart, go to [email or web address].    Your next appointment:   6 month(s)  The format for your next appointment:   In Person  Provider:   Jennifer Crape, MD    Other Instructions none  Important Information About Sugar

## 2023-11-18 NOTE — Assessment & Plan Note (Signed)
 Given elderly age and tendency for low blood pressures and syncopal episodes, target blood pressure below 140/90 mmHg, to avoid hypotension.   Continue with metoprolol  succinate, switch the dose to 25 mg in the morning and 12.5 mg in the evening. Continue losartan  25 mg twice daily.  Advised her to avoid sudden changes in position and give herself extra time as she gets up to ambulate.  Advised her to sit down or lay down immediately at onset of any symptoms of lightheadedness.  Advised low salt in her diet.

## 2023-11-18 NOTE — Progress Notes (Signed)
 Cardiology Consultation:    Date:  11/18/2023   ID:  Chelsea, Mcintyre 04-28-1933, MRN [id number]  PCP:  Thurmond Cathlyn LABOR., MD  Cardiologist:  Alean JONELLE Kobus, MD   Referring MD: Thurmond Cathlyn LABOR., MD   No chief complaint on file.    ASSESSMENT AND PLAN:   [name] is an 89 year old woman with history of history of hypertension, hyperlipidemia, statin intolerance, CVA, breast cancer, mild carotid atherosclerosis without significant obstruction less than 50% on ultrasound June 2018], frequent supraventricular ectopic beats burden up to 14% and short runs of SVT on heart monitor May 2024, syncopal episodes [most recent May 2025], echocardiogram May 2025 noted LVEF 55 to 60%, mild MR, mild TR, mild pulmonary insufficiency.   Here for follow-up visit Problem List Items Addressed This Visit     TIA (transient ischemic attack)   Plan history of CVA. Advised her to continue with her Plavix  at full-strength 75 mg once daily. Continue lipid-lowering therapy with Zetia  10 mg once daily.       Relevant Medications   metoprolol  succinate (TOPROL  XL) 25 MG 24 hr tablet   Essential hypertension - Primary   Given elderly age and tendency for low blood pressures and syncopal episodes, target blood pressure below 140/90 mmHg, to avoid hypotension.   Continue with metoprolol  succinate, switch the dose to 25 mg in the morning and 12.5 mg in the evening. Continue losartan  25 mg twice daily.  Advised her to avoid sudden changes in position and give herself extra time as she gets up to ambulate.  Advised her to sit down or lay down immediately at onset of any symptoms of lightheadedness.  Advised low salt in her diet.        Relevant Medications   metoprolol  succinate (TOPROL  XL) 25 MG 24 hr tablet   Mixed hyperlipidemia    Last lipid panel from January 2025 with total cholesterol 204, HDL 58, LDL 125, triglycerides 100  Did not tolerate statins due to myalgias. Remains on Zetia  10  mg once daily. Will reassess lipid panel at subsequent follow-up visit in 6 months and if not at goal can consider PCSK9 inhibitors.       Relevant Medications   metoprolol  succinate (TOPROL  XL) 25 MG 24 hr tablet      History of Present Illness:    Chelsea Mcintyre is a 89 y.o. female who is being seen today for a follow-up visit. PCP is Thurmond Cathlyn LABOR., MD. Last visit at our office was 10/23/2023 with Delon Hoover, NP-C. Here for the visit today by herself.  Has history of hypertension, hyperlipidemia, statin intolerance, CVA, breast cancer, mild carotid atherosclerosis without significant obstruction less than 50% on ultrasound June 2018], frequent supraventricular ectopic beats burden up to 14% and short runs of SVT on heart monitor May 2024, syncopal episodes [most recent May 2025], echocardiogram May 2025 noted LVEF 55 to 60%, mild MR, mild TR, mild pulmonary insufficiency.  She has had 2 episodes of syncope while in October 2024 while working out in the garden that she turned around. The other was May 2025 at night when she was using the restroom.  Presented to the ER. Subsequently has been taken off hydrochlorothiazide .  She had blood with values in the office on June 6.  Now here for a follow-up visit.  Mentions overall she has been doing well.  Blood pressure readings at home reviewed with systolic ranging between 130s to 140s and diastolic between 19d to  90s. No further episodes of syncope or near syncope. Denies any other cardiac symptoms.  Compliance with her medications, but has been taking Plavix  half a tablet due to concerns about bruising. Has been taking metoprolol  succinate half tablet twice daily and losartan  1 tablet twice daily.   Last lipid panel from January 2025 with total cholesterol 204, HDL 58, LDL 125, triglycerides 100 Hemoglobin A1c 5.6 Recent thyroid panel normal on October 22, 2023.  Past Medical History:  Diagnosis Date   Arthritis    bilateral hips  and bilateral shoulders.   Cancer (HCC)    skin back, nose   Complication of anesthesia    heart stopped with the anesthesia x1,  difficult to wake up   Dysrhythmia    if taking certain medications   Family history of adverse reaction to anesthesia    sister slow to awaken- almost died last time   Frequency of urination    Hypertension    Jaundice    as a child   MVP (mitral valve prolapse)    Pneumonia 2009   hx   Porphyria variegata (HCC)    family history of pt thinks she might have as well   Recurrent upper respiratory infection (URI)    Dx 1st of september with sinus infection, placed on antibiotics at that time   Stroke [facility])    2018    Past Surgical History:  Procedure Laterality Date   ABDOMINAL HYSTERECTOMY  75   partial   APPENDECTOMY     BACK SURGERY  2012   kyphoplasty   BREAST LUMPECTOMY WITH RADIOACTIVE SEED LOCALIZATION Right 09/13/2021   Procedure: RIGHT BREAST LUMPECTOMY WITH RADIOACTIVE SEED LOCALIZATION;  Surgeon: Aron Shoulders, MD;  Location: [facility];  Service: General;  Laterality: Right;   BREAST SURGERY     1982,70rt   CARDIAC CATHETERIZATION     1994, detected MVP   ELBOW FRACTURE SURGERY Left 89,   hardware removed later in year   EYE SURGERY     bilateral cataract surgery 2007 with lens implant   fibroid cyst     ovary right removed 1966   fibroid tumor     1970, right breast removed   FRACTURE SURGERY     1988 shattered elbow left   HARDWARE REMOVAL     left elbow 1998   KYPHOPLASTY Bilateral 04/11/2019   Procedure: KYPHOPLASTY BILATERAL, THORACIC SEVEN- THORACIC EIGHT;  Surgeon: Mavis Purchase, MD;  Location: [facility] OR;  Service: Neurosurgery;  Laterality: Bilateral;  KYPHOPLASTY BILATERAL, THORACIC 7- THORACIC 8   MASS EXCISION Left 09/13/2021   Procedure: EXCISION OF LEFT AXILLARY MASS;  Surgeon: Aron Shoulders, MD;  Location: [facility];  Service: General;  Laterality: Left;   REVERSE SHOULDER  ARTHROPLASTY Right 10/28/2018   Procedure: REVERSE SHOULDER ARTHROPLASTY;  Surgeon: Melita Drivers, MD;  Location: [facility];  Service: Orthopedics;  Laterality: Right;    TONSILLECTOMY  44   and adenoids   TOTAL HIP ARTHROPLASTY Left 05/15/2015   Procedure: LEFT TOTAL HIP ARTHROPLASTY ANTERIOR APPROACH;  Surgeon: Redell Shoals, MD;  Location: [facility] OR;  Service: Orthopedics;  Laterality: Left;    Current Medications: Current Meds  Medication Sig   amoxicillin (AMOXIL) 500 MG capsule Take 2,000 mg by mouth See admin instructions. Take 4 capsules (2000 mg) by mouth 1 hour prior to dental work   Apoaequorin (PREVAGEN PO) Take 1 tablet by mouth daily.   ASCORBIC ACID PO Take 1 tablet by mouth daily.  calcium  carbonate (OS-CAL - DOSED IN MG OF ELEMENTAL CALCIUM ) 1250 (500 Ca) MG tablet Take 1 tablet by mouth daily with breakfast.   Cholecalciferol  25 MCG (1000 UT) tablet Take 1,000 Units by mouth daily.   diclofenac Sodium (VOLTAREN) 1 % GEL Apply topically.   ezetimibe  (ZETIA ) 10 MG tablet Take 10 mg by mouth daily.   losartan  (COZAAR ) 25 MG tablet Take 25 mg by mouth 2 (two) times daily.   MAGNESIUM -ZINC  PO Take 1 tablet by mouth in the morning.   potassium gluconate 595 (99 K) MG TABS tablet Take 99 mg by mouth daily.   Turmeric, Curcuma [place]a, (TURMERIC ROOT) POWD Take by mouth.   Vitamin A 2400 MCG (8000 UT) CAPS Take by mouth.   [DISCONTINUED] clopidogrel  (PLAVIX ) 75 MG tablet Take 75 mg by mouth daily. (Patient taking differently: Take 37.5 mg by mouth daily.)   [DISCONTINUED] metoprolol  succinate (TOPROL  XL) 25 MG 24 hr tablet Take 0.5 tablets (12.5 mg total) by mouth 2 (two) times daily.     Allergies:   Hrt base [hormone cream base], Sulfa drugs cross reactors, Gluten meal, Other, Tape, Ace inhibitors, Latex, Lisinopril, Nsaids, Statins, Sulphasomidine, Amlodipine, and Barbiturates   Social History   Socioeconomic History   Marital status: Widowed    Spouse name: Not on file    Number of children: Not on file   Years of education: Not on file   Highest education level: Not on file  Occupational History   Not on file  Tobacco Use   Smoking status: Former    Current packs/day: 0.50    Average packs/day: 0.5 packs/day for 2.0 years (1.0 ttl pk-yrs)    Types: Cigarettes   Smokeless tobacco: Never   Tobacco comments:    off and on, non smoker presently  Vaping Use   Vaping status: Never Used  Substance and Sexual Activity   Alcohol  use: No   Drug use: No   Sexual activity: Not on file  Other Topics Concern   Not on file  Social History Narrative   Not on file   Social Drivers of Health   Financial Resource Strain: Not on file  Food Insecurity: Low Risk  (10/15/2023)   Received from [facility]   Hunger Vital Sign    Within the past 12 months, you worried that your food would run out before you got money to buy more: Never true    Within the past 12 months, the food you bought just didn't last and you didn't have money to get more. : Never true  Transportation Needs: No Transportation Needs (10/15/2023)   Received from [facility]    In the past 12 months, has lack of reliable transportation kept you from medical appointments, meetings, work or from getting things needed for daily living? : No  Physical Activity: Not on file  Stress: Not on file  Social Connections: Not on file     Family History: The patient's family history includes Acute myelogenous leukemia (age of onset: 46) in her mother; Anesthesia problems in her father; Breast cancer in her paternal aunt and paternal grandmother; Lung cancer (age of onset: 37) in her father; Pancreatic cancer in her maternal aunt; Prostate cancer in her maternal uncle. ROS:   Please see the history of present illness.    All 14 point review of systems negative except as described per history of present illness.  EKGs/Labs/Other Studies Reviewed:    The following studies were reviewed  today:   EKG:       Recent Labs: No results found for requested labs within last 365 days.  Recent Lipid Panel    Component Value Date/Time   CHOL 200 10/20/2016 1846   TRIG 94 10/20/2016 1846   HDL 50 10/20/2016 1846   CHOLHDL 4.0 10/20/2016 1846   VLDL 19 10/20/2016 1846   LDLCALC 131 (H) 10/20/2016 1846    Physical Exam:    VS:  BP (!) 140/78 (BP Location: Right Arm)   Pulse 69   Ht 5' 3 (1.6 m)   Wt 150 lb (68 kg)   SpO2 97%   BMI 26.57 kg/m     Wt Readings from Last 3 Encounters:  11/18/23 150 lb (68 kg)  10/23/23 149 lb (67.6 kg)  10/09/22 157 lb (71.2 kg)     GENERAL:  Well nourished, well developed in no acute distress NECK: No JVD; No carotid bruits CARDIAC: RRR, S1 and S2 present, no murmurs, no rubs, no gallops CHEST:  Clear to auscultation without rales, wheezing or rhonchi  Extremities: No pitting pedal edema. Pulses bilaterally symmetric with radial 2+ and dorsalis pedis 2+ NEUROLOGIC:  Alert and oriented x 3  Medication Adjustments/Labs and Tests Ordered: Current medicines are reviewed at length with the patient today.  Concerns regarding medicines are outlined above.  No orders of the defined types were placed in this encounter.  Meds ordered this encounter  Medications   clopidogrel  (PLAVIX ) 75 MG tablet    Sig: Take 1 tablet (75 mg total) by mouth daily.    Dispense:  90 tablet    Refill:  3   metoprolol  succinate (TOPROL  XL) 25 MG 24 hr tablet    Sig: Take 25 mg (1 tablet) in the morning and 12.5 mg  (0.5 tablet) in the evening.    Dispense:  135 tablet    Refill:  3    Signed, Molli Gethers reddy Melainie Krinsky, MD, MPH, [facility]. 11/18/2023 4:02 PM    [facility]

## 2023-11-18 NOTE — Assessment & Plan Note (Signed)
  Last lipid panel from January 2025 with total cholesterol 204, HDL 58, LDL 125, triglycerides 100  Did not tolerate statins due to myalgias. Remains on Zetia  10 mg once daily. Will reassess lipid panel at subsequent follow-up visit in 6 months and if not at goal can consider PCSK9 inhibitors.

## 2024-01-22 ENCOUNTER — Telehealth: Payer: Self-pay

## 2024-01-22 NOTE — Telephone Encounter (Signed)
 RX sent in on 11/18/23. Confirmed with Pharmacy

## 2024-01-22 NOTE — Telephone Encounter (Signed)
*  STAT* If patient is at the pharmacy, call can be transferred to refill team.   1. Which medications need to be refilled? (please list name of each medication and dose if known) metoprolol  succinate (TOPROL  XL) 25 MG 24 hr tablet    2. Would you like to learn more about the convenience, safety, & potential cost savings by using the [facility]?     3. Are you open to using the [facility] (Type [facility]. ).   4. Which pharmacy/location (including street and city if local pharmacy) is medication to be sent to? [facility] [phone] - [place], [place] - [address]    5. Do they need a 30 day or 90 day supply? 90 day

## 2024-05-09 DIAGNOSIS — I499 Cardiac arrhythmia, unspecified: Secondary | ICD-10-CM | POA: Insufficient documentation

## 2024-05-09 DIAGNOSIS — C801 Malignant (primary) neoplasm, unspecified: Secondary | ICD-10-CM | POA: Insufficient documentation

## 2024-05-09 DIAGNOSIS — I341 Nonrheumatic mitral (valve) prolapse: Secondary | ICD-10-CM | POA: Insufficient documentation

## 2024-05-09 DIAGNOSIS — J069 Acute upper respiratory infection, unspecified: Secondary | ICD-10-CM | POA: Insufficient documentation

## 2024-05-09 DIAGNOSIS — T8859XA Other complications of anesthesia, initial encounter: Secondary | ICD-10-CM | POA: Insufficient documentation

## 2024-05-09 DIAGNOSIS — M1990 Unspecified osteoarthritis, unspecified site: Secondary | ICD-10-CM | POA: Insufficient documentation

## 2024-05-09 DIAGNOSIS — Z8489 Family history of other specified conditions: Secondary | ICD-10-CM | POA: Insufficient documentation

## 2024-05-09 DIAGNOSIS — I639 Cerebral infarction, unspecified: Secondary | ICD-10-CM | POA: Insufficient documentation

## 2024-05-09 DIAGNOSIS — R350 Frequency of micturition: Secondary | ICD-10-CM | POA: Insufficient documentation

## 2024-05-09 DIAGNOSIS — R17 Unspecified jaundice: Secondary | ICD-10-CM | POA: Insufficient documentation

## 2024-05-17 ENCOUNTER — Ambulatory Visit

## 2024-05-17 VITALS — BP 136/80 | HR 80 | Ht 63.5 in | Wt 147.1 lb

## 2024-05-17 DIAGNOSIS — I10 Essential (primary) hypertension: Secondary | ICD-10-CM | POA: Diagnosis not present

## 2024-05-17 DIAGNOSIS — E782 Mixed hyperlipidemia: Secondary | ICD-10-CM | POA: Diagnosis not present

## 2024-05-17 DIAGNOSIS — R002 Palpitations: Secondary | ICD-10-CM | POA: Diagnosis not present

## 2024-05-17 NOTE — Assessment & Plan Note (Signed)
 Well-controlled. Continue current medications losartan  25 mg twice daily Metoprolol  XL 25 mg in the morning and 12.5 mg in the evening. Target blood pressure below 140/90 mmHg.

## 2024-05-17 NOTE — Assessment & Plan Note (Signed)
 Did not tolerate statins due to myalgias. Continue Zetia  10 mg once daily. Has pending follow-up with PCP in the next couple months. If LDL remains above 70mg /dL consider bempedoic acid or PCSK9 inhibitors given her history of CVA.

## 2024-05-17 NOTE — Patient Instructions (Signed)

## 2024-05-17 NOTE — Progress Notes (Signed)
 "  Cardiology Consultation:    Date:  05/17/2024   ID:  Mcintyre, Chelsea 11-18-1932, MRN [id number]  PCP:  Thurmond Cathlyn LABOR., MD  Cardiologist:  Alean SAUNDERS Tommy Minichiello, MD   Referring MD: Thurmond Cathlyn LABOR., MD   No chief complaint on file.    ASSESSMENT AND PLAN:   [name] 89 year old woman history of hypertension, hyperlipidemia, statin intolerance, CVA, breast cancer, mild carotid atherosclerosis without significant obstruction less than 50% on ultrasound June 2018], frequent supraventricular ectopic beats burden up to 14% and short runs of SVT on heart monitor May 2024, syncopal episodes [most recent May 2025], echocardiogram May 2025 noted LVEF 55 to 60%, mild MR, mild TR, mild pulmonary insufficiency.  Problem List Items Addressed This Visit       Cardiovascular and Mediastinum   Essential hypertension - Primary   Well-controlled. Continue current medications losartan  25 mg twice daily Metoprolol  XL 25 mg in the morning and 12.5 mg in the evening. Target blood pressure below 140/90 mmHg.      Relevant Medications   EPINEPHrine  0.3 mg/0.3 mL IJ SOAJ injection     Other   Mixed hyperlipidemia   Did not tolerate statins due to myalgias. Continue Zetia  10 mg once daily. Has pending follow-up with PCP in the next couple months. If LDL remains above 70mg /dL consider bempedoic acid or PCSK9 inhibitors given her history of CVA.      Relevant Medications   EPINEPHrine  0.3 mg/0.3 mL IJ SOAJ injection   Palpitations   Symptoms appear related to frequent supraventricular ectopy burden up to 14% and short runs of SVT on heart monitor in the past May 2024.  Has been well-controlled with low-dose metoprolol  XL 12.5 mg twice daily with additional dose of 12.5 mg in the morning on the days she is active outdoors. Continue with same.         History of Present Illness:    Chelsea Mcintyre is a 89 y.o. female who is being seen today for follow-up visit. PCP Thurmond Cathlyn LABOR.,  MD. Last visit with me in the office was 11/18/2023.  Pleasant woman here for the visit by herself.  Has history of hypertension, hyperlipidemia, statin intolerance, CVA, breast cancer, mild carotid atherosclerosis without significant obstruction less than 50% on ultrasound June 2018], frequent supraventricular ectopic beats burden up to 14% and short runs of SVT on heart monitor May 2024, syncopal episodes [most recent May 2025], echocardiogram May 2025 noted LVEF 55 to 60%, mild MR, mild TR, mild pulmonary insufficiency.   Mentions overall from cardiac standpoint she has been doing well. Denies any further episodes of syncope or falls since May of this year. Ambulates using a cane.  Mentions blood pressures at home typically runs between 130s to 140s systolic and diastolic 80s and rarely in 90s. Bilateral lower extremity swelling towards the end of the day.  Mentions she does have hard time putting on compression socks as they appear to bruise her legs.  She does have bruises over bilateral lower extremities.  Has mild discomfort midline of the neck without any point tenderness, swelling or redness.  Numbness or tingling down the shoulders or the arms.  Describes the discomfort as ache when she is sitting and working on documents for possible fall when she lays down without a pillow.  Recommended she review this further with her PCP and take breaks while working for extended.  And use appropriate pillow at sleep.  Denies any blood in urine or  stools. Taking her Plavix  75 mg full-strength consistently as recommended previously.  Takes metoprolol  XL 12.5 mg twice daily and in warmer weather when she is outdoors working in the yard takes 25 mg dose in the morning as she finds it helps control her symptoms of palpitations during activity.  Last lipid panel from January 2025 with total cholesterol 204, HDL 58, LDL 125, triglycerides 100   Past Medical History:  Diagnosis Date   Age-related  cognitive decline 08/29/2022   Arthritis    bilateral hips and bilateral shoulders.   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   Dysrhythmia    if taking certain medications   Essential hypertension 09/14/2015   Last Assessment & Plan:    Relevant Hx:  Course:  Daily Update:  Today's Plan:she is doing well from this and will follow her along with her current dose of meds she is on     Electronically signed by: Eleanor Merlynn Lady, NP  09/17/15 1244     Family history of adverse reaction to anesthesia    sister slow to awaken- almost died last time   Family history of breast cancer 09/04/2021   Family history of pancreatic cancer 09/04/2021   Fibrocystic breast disease 09/14/2015   Last Assessment & Plan:    Relevant Hx:   Course:   Daily Update:   Today's Plan:will get copy of her mammogram and she did not have to have US  this year which was good       Electronically signed by: Eleanor Merlynn Lady, NP   09/17/15 1242     Fracture of inferior pubic ramus (HCC) 04/07/2023   Frequency of urination    Genetic testing 09/18/2021   [facility] CustomNext Panel was Negative. Report date is 09/16/2021.     The CustomNext-Cancer+RNAinsight panel offered by [facility] includes sequencing and rearrangement analysis for the following 47 genes:  APC, ATM, AXIN2, BARD1,  BMPR1A, BRCA1, BRCA2, BRIP1, CDH1, CDK4, CDKN2A, CHEK2, CTNNA1, DICER1, EPCAM, GREM1, HOXB13, KIT, MEN1, MLH1, MSH2, MSH3, MSH6, MUTYH, NBN, NF1, NTHL1, PALB2, PDGFRA, P   H/O complications due to general anesthesia 09/04/2021   Hearing loss 09/04/2021   High risk medication use 09/14/2015   History of COVID-19 12/10/2020   2022 diagnosis July     History of CVA (cerebrovascular accident) 11/26/2016   Involving the right basal ganglia and multiple cerebellar infarcts age   undetermined     Hypercalcemia 04/06/2019   IBS (irritable bowel syndrome) 09/14/2015   Last Assessment & Plan:    Relevant Hx:   Course:   Daily Update:   Today's Plan:this appears to be stable for her at this time discussed her last colonoscopy which was 2009 and GI indicated she would not need another one  Electronically signed by: Eleanor Merlynn Lady, NP   09/17/15 1244     Jaundice    as a child   Leg swelling 09/04/2021   Localized swelling, mass and lump, left upper limb 08/14/2021   Lumbar compression fracture (HCC) 04/09/2011   Lymphedema of breast 12/02/2021   Malaise and fatigue 09/14/2015   Last Assessment & Plan:    Relevant Hx:   Course:   Daily Update:   Today's Plan:she feels she is getting back to her normal self now, her BP has gotten back to baseline from where it was low postop hip, and she is getting outside more and will update her labs though for her       Electronically signed by: Eleanor Merlynn Lady, NP   09/17/15 1243     Malignant neoplasm of upper-outer quadrant of right breast in female, estrogen receptor positive (HCC) 08/30/2021   Mixed hyperlipidemia 09/14/2015   Last Assessment & Plan:    Relevant Hx:   Course:   Daily Update:   Today's Plan:update her lipids for her today and have reviewed her last set today       Electronically signed by: Eleanor Merlynn Lady, NP   09/17/15 1241     MVP (mitral valve prolapse)    Non-seasonal allergic rhinitis  due to pollen 04/23/2017   Osteoarthritis of right shoulder 06/11/2018   Pain in joint of right shoulder 04/01/2018   Pneumonia 2009   hx   Porphyria variegata (HCC)    family history of pt thinks she might have as well   Primary osteoarthritis of left hip 05/15/2015   Recurrent upper respiratory infection (URI)    Dx 1st of september with sinus infection, placed on antibiotics at that time   S/P reverse total shoulder arthroplasty, right 10/28/2018   Seroma of breast 09/27/2021   Squamous cell skin cancer 05/06/2023   Stage 3a chronic kidney disease (HCC) 03/15/2019   Statin intolerance 12/06/2018   Stroke (HCC)    2018   Thoracic compression fracture, with delayed healing, subsequent encounter 04/11/2019   TIA (transient ischemic attack) 10/20/2016   Urge incontinence of urine 04/23/2017    Past Surgical History:  Procedure Laterality Date   ABDOMINAL HYSTERECTOMY  75   partial   APPENDECTOMY     BACK SURGERY  2012   kyphoplasty   BREAST LUMPECTOMY WITH RADIOACTIVE SEED LOCALIZATION Right 09/13/2021   Procedure: RIGHT BREAST LUMPECTOMY WITH RADIOACTIVE SEED LOCALIZATION;  Surgeon: Aron Shoulders, MD;  Location: [facility];  Service: General;  Laterality: Right;   BREAST SURGERY     1982,70rt   CARDIAC CATHETERIZATION     1994, detected MVP   ELBOW FRACTURE SURGERY Left 89,   hardware removed later in year   EYE SURGERY     bilateral cataract surgery 2007 with lens implant   fibroid cyst     ovary right removed 1966   fibroid tumor     1970, right breast removed   FRACTURE SURGERY     1988 shattered elbow left   HARDWARE REMOVAL     left elbow 1998   KYPHOPLASTY Bilateral 04/11/2019   Procedure: KYPHOPLASTY BILATERAL, THORACIC SEVEN- THORACIC EIGHT;  Surgeon: Mavis Purchase, MD;  Location[facility];  Service: Neurosurgery;  Laterality: Bilateral;  KYPHOPLASTY BILATERAL, THORACIC 7- THORACIC 8   MASS EXCISION Left 09/13/2021   Procedure: EXCISION OF LEFT  AXILLARY MASS;  Surgeon: Aron Shoulders, MD;  Location: [facility];  Service: General;  Laterality: Left;   REVERSE SHOULDER ARTHROPLASTY Right 10/28/2018   Procedure: REVERSE SHOULDER ARTHROPLASTY;  Surgeon: Melita Drivers, MD;  Location: [facility];  Service: Orthopedics;  Laterality: Right;    TONSILLECTOMY  44   and adenoids   TOTAL HIP ARTHROPLASTY Left 05/15/2015   Procedure: LEFT TOTAL HIP ARTHROPLASTY ANTERIOR APPROACH;  Surgeon: Redell Shoals, MD;  Location: [facility] OR;  Service: Orthopedics;  Laterality: Left;    Current Medications: Active Medications[1]   Allergies:   Hrt base [hormone cream base], Sulfa drugs cross reactors, Gluten meal, Other, Tape, Ace inhibitors, Latex, Lisinopril, Nsaids, Statins, Sulphasomidine, Amlodipine, and Barbiturates   Social History   Socioeconomic History   Marital status: Widowed    Spouse name: Not on file   Number of children: Not on file   Years of education: Not on file   Highest education level: Not on file  Occupational History   Not on file  Tobacco Use   Smoking status: Former    Current packs/day: 0.50    Average packs/day: 0.5 packs/day for 2.0 years (1.0 ttl pk-yrs)    Types: Cigarettes   Smokeless tobacco: Never   Tobacco comments:    off and on, non smoker presently  Vaping Use   Vaping status: Never Used  Substance and Sexual Activity   Alcohol  use: No   Drug use: No   Sexual activity: Not on file  Other Topics Concern   Not on file  Social History Narrative   Not on file   Social Drivers of Health   Tobacco Use: Medium Risk (05/17/2024)   Patient History    Smoking Tobacco Use: Former    Smokeless Tobacco Use: Never    Passive Exposure: Not on Actuary Strain: Not on file  Food Insecurity: Low Risk (10/15/2023)   Received from [facility]   Epic    Within the past 12 months, you worried that your food would run out before you got money to buy more: Never true    Within the  past 12 months, the food you bought just didn't last and you didn't have money to get more. : Never true  Transportation Needs: No Transportation Needs (10/15/2023)   Received from [facility]    In the past 12 months, has lack of reliable transportation kept you from medical appointments, meetings, work or from getting things needed for daily living? : No  Physical Activity: Not on file  Stress: Not on file  Social Connections: Not on file  Depression (EYV7-0): Not on file  Alcohol  Screen: Not on file  Housing: Unknown (12/15/2023)   Received from [facility]   Epic    Unable to Pay for Housing in the Last Year: Not on file    Number of Times Moved in the Last Year: Not on file    At any time in the past 12 months, were you homeless or living in a shelter (including now)?: No  Utilities: Low Risk (10/15/2023)   Received from [facility]   Utilities    In the past 12 months has the electric, gas, oil, or water  company threatened to shut off services in your home? : No  Health Literacy: Not on file     Family History: The patient's family history includes Acute myelogenous leukemia (age of onset: 59) in her mother; Anesthesia problems in her father; Breast cancer in her paternal aunt and paternal grandmother; Lung cancer (age of onset: 35)  in her father; Pancreatic cancer in her maternal aunt; Prostate cancer in her maternal uncle. ROS:   Please see the history of present illness.    All 14 point review of systems negative except as described per history of present illness.  EKGs/Labs/Other Studies Reviewed:    The following studies were reviewed today:   EKG:       Recent Labs: No results found for requested labs within last 365 days.  Recent Lipid Panel    Component Value Date/Time   CHOL 200 10/20/2016 1846   TRIG 94 10/20/2016 1846   HDL 50 10/20/2016 1846   CHOLHDL 4.0 10/20/2016 1846   VLDL 19 10/20/2016 1846   LDLCALC 131 (H)  10/20/2016 1846    Physical Exam:    VS:  BP 136/80   Pulse 80   Ht 5' 3.5 (1.613 m)   Wt 147 lb 2 oz (66.7 kg)   SpO2 98%   BMI 25.65 kg/m     Wt Readings from Last 3 Encounters:  05/17/24 147 lb 2 oz (66.7 kg)  11/18/23 150 lb (68 kg)  10/23/23 149 lb (67.6 kg)     GENERAL:  Well nourished, well developed in no acute distress NECK: No JVD; No carotid bruits CARDIAC: RRR, S1 and S2 present, no murmurs, no rubs, no gallops CHEST:  Clear to auscultation without rales, wheezing or rhonchi  Extremities: No pitting pedal edema. Pulses bilaterally symmetric with radial 2+ and dorsalis pedis 2+ NEUROLOGIC:  Alert and oriented x 3  Medication Adjustments/Labs and Tests Ordered: Current medicines are reviewed at length with the patient today.  Concerns regarding medicines are outlined above.  No orders of the defined types were placed in this encounter.  No orders of the defined types were placed in this encounter.   Signed, Alean reddy Cheyanna Strick, MD, MPH, [facility] 05/17/2024 10:43 AM    [facility]    [1]  Current Meds  Medication Sig   amoxicillin (AMOXIL) 500 MG capsule Take 2,000 mg by mouth See admin instructions. Take 4 capsules (2000 mg) by mouth 1 hour prior to dental work   ASCORBIC ACID PO Take 1 tablet by mouth daily.   calcium  carbonate (OS-CAL - DOSED IN MG OF ELEMENTAL CALCIUM ) 1250 (500 Ca) MG tablet Take 1 tablet by mouth daily with breakfast.   Cholecalciferol  25 MCG (1000 UT) tablet Take 1,000 Units by mouth daily.   clopidogrel  (PLAVIX ) 75 MG tablet Take 1 tablet (75 mg total) by mouth daily.   diclofenac Sodium (VOLTAREN) 1 % GEL Apply topically.   EPINEPHrine  0.3 mg/0.3 mL IJ SOAJ injection Inject 1 Syringe into the muscle as needed.   ezetimibe  (ZETIA ) 10 MG tablet Take 10 mg by mouth daily.   losartan  (COZAAR ) 25 MG tablet Take 25 mg by mouth 2 (two) times daily.   MAGNESIUM -ZINC  PO Take 1 tablet by mouth in the morning.    metoprolol  succinate (TOPROL  XL) 25 MG 24 hr tablet Take 25 mg (1 tablet) in the morning and 12.5 mg  (0.5 tablet) in the evening.   potassium gluconate 595 (99 K) MG TABS tablet Take 99 mg by mouth daily.   Turmeric, Curcuma [place]a, (TURMERIC ROOT) POWD Take by mouth.   Vitamin A 2400 MCG (8000 UT) CAPS Take by mouth.   "

## 2024-05-17 NOTE — Assessment & Plan Note (Signed)
 Symptoms appear related to frequent supraventricular ectopy burden up to 14% and short runs of SVT on heart monitor in the past May 2024.  Has been well-controlled with low-dose metoprolol  XL 12.5 mg twice daily with additional dose of 12.5 mg in the morning on the days she is active outdoors. Continue with same.
